# Patient Record
Sex: Male | Born: 1935 | Race: White | Hispanic: No | Marital: Single | Smoking: Never smoker
Health system: Southern US, Community
[De-identification: ages and names within clinical notes are randomized; demographics above are authoritative.]

## PROBLEM LIST (undated history)

## (undated) DIAGNOSIS — I10 Essential (primary) hypertension: Secondary | ICD-10-CM

## (undated) DIAGNOSIS — C801 Malignant (primary) neoplasm, unspecified: Secondary | ICD-10-CM

## (undated) DIAGNOSIS — F32A Depression, unspecified: Secondary | ICD-10-CM

## (undated) DIAGNOSIS — I351 Nonrheumatic aortic (valve) insufficiency: Secondary | ICD-10-CM

## (undated) DIAGNOSIS — H3530 Unspecified macular degeneration: Secondary | ICD-10-CM

## (undated) DIAGNOSIS — I951 Orthostatic hypotension: Secondary | ICD-10-CM

## (undated) DIAGNOSIS — Z8673 Personal history of transient ischemic attack (TIA), and cerebral infarction without residual deficits: Secondary | ICD-10-CM

## (undated) DIAGNOSIS — E559 Vitamin D deficiency, unspecified: Secondary | ICD-10-CM

## (undated) DIAGNOSIS — R490 Dysphonia: Secondary | ICD-10-CM

## (undated) DIAGNOSIS — I6529 Occlusion and stenosis of unspecified carotid artery: Secondary | ICD-10-CM

## (undated) DIAGNOSIS — R42 Dizziness and giddiness: Secondary | ICD-10-CM

## (undated) DIAGNOSIS — R809 Proteinuria, unspecified: Secondary | ICD-10-CM

## (undated) DIAGNOSIS — R7989 Other specified abnormal findings of blood chemistry: Secondary | ICD-10-CM

## (undated) DIAGNOSIS — G459 Transient cerebral ischemic attack, unspecified: Secondary | ICD-10-CM

## (undated) DIAGNOSIS — R351 Nocturia: Secondary | ICD-10-CM

## (undated) DIAGNOSIS — K5900 Constipation, unspecified: Secondary | ICD-10-CM

## (undated) DIAGNOSIS — N390 Urinary tract infection, site not specified: Secondary | ICD-10-CM

## (undated) DIAGNOSIS — F329 Major depressive disorder, single episode, unspecified: Secondary | ICD-10-CM

## (undated) DIAGNOSIS — R413 Other amnesia: Secondary | ICD-10-CM

## (undated) DIAGNOSIS — R0989 Other specified symptoms and signs involving the circulatory and respiratory systems: Secondary | ICD-10-CM

## (undated) DIAGNOSIS — R350 Frequency of micturition: Secondary | ICD-10-CM

## (undated) DIAGNOSIS — F0390 Unspecified dementia without behavioral disturbance: Secondary | ICD-10-CM

## (undated) DIAGNOSIS — F419 Anxiety disorder, unspecified: Secondary | ICD-10-CM

## (undated) DIAGNOSIS — H259 Unspecified age-related cataract: Secondary | ICD-10-CM

## (undated) DIAGNOSIS — I059 Rheumatic mitral valve disease, unspecified: Secondary | ICD-10-CM

## (undated) DIAGNOSIS — I639 Cerebral infarction, unspecified: Secondary | ICD-10-CM

## (undated) DIAGNOSIS — C61 Malignant neoplasm of prostate: Secondary | ICD-10-CM

## (undated) DIAGNOSIS — T4145XA Adverse effect of unspecified anesthetic, initial encounter: Secondary | ICD-10-CM

## (undated) DIAGNOSIS — H409 Unspecified glaucoma: Secondary | ICD-10-CM

## (undated) DIAGNOSIS — T8859XA Other complications of anesthesia, initial encounter: Secondary | ICD-10-CM

## (undated) DIAGNOSIS — N179 Acute kidney failure, unspecified: Secondary | ICD-10-CM

## (undated) DIAGNOSIS — R011 Cardiac murmur, unspecified: Secondary | ICD-10-CM

## (undated) DIAGNOSIS — K219 Gastro-esophageal reflux disease without esophagitis: Secondary | ICD-10-CM

## (undated) HISTORY — DX: Urinary tract infection, site not specified: N39.0

## (undated) HISTORY — DX: Occlusion and stenosis of unspecified carotid artery: I65.29

## (undated) HISTORY — DX: Proteinuria, unspecified: R80.9

## (undated) HISTORY — DX: Unspecified macular degeneration: H35.30

## (undated) HISTORY — DX: Rheumatic mitral valve disease, unspecified: I05.9

## (undated) HISTORY — DX: Personal history of transient ischemic attack (TIA), and cerebral infarction without residual deficits: Z86.73

## (undated) HISTORY — DX: Major depressive disorder, single episode, unspecified: F32.9

## (undated) HISTORY — DX: Cerebral infarction, unspecified: I63.9

## (undated) HISTORY — DX: Malignant neoplasm of prostate: C61

## (undated) HISTORY — DX: Dizziness and giddiness: R42

## (undated) HISTORY — DX: Anxiety disorder, unspecified: F41.9

## (undated) HISTORY — DX: Unspecified age-related cataract: H25.9

## (undated) HISTORY — PX: RETINAL DETACHMENT SURGERY: SHX105

## (undated) HISTORY — DX: Transient cerebral ischemic attack, unspecified: G45.9

## (undated) HISTORY — DX: Nonrheumatic aortic (valve) insufficiency: I35.1

## (undated) HISTORY — DX: Essential (primary) hypertension: I10

## (undated) HISTORY — DX: Unspecified glaucoma: H40.9

## (undated) HISTORY — PX: CATARACT EXTRACTION: SUR2

## (undated) HISTORY — DX: Depression, unspecified: F32.A

## (undated) HISTORY — DX: Orthostatic hypotension: I95.1

## (undated) HISTORY — DX: Vitamin D deficiency, unspecified: E55.9

## (undated) HISTORY — DX: Malignant (primary) neoplasm, unspecified: C80.1

## (undated) HISTORY — DX: Acute kidney failure, unspecified: N17.9

## (undated) HISTORY — DX: Unspecified dementia, unspecified severity, without behavioral disturbance, psychotic disturbance, mood disturbance, and anxiety: F03.90

## (undated) HISTORY — DX: Other specified abnormal findings of blood chemistry: R79.89

## (undated) HISTORY — DX: Other specified symptoms and signs involving the circulatory and respiratory systems: R09.89

---

## 1998-10-25 ENCOUNTER — Other Ambulatory Visit: Admission: RE | Admit: 1998-10-25 | Discharge: 1998-10-25 | Payer: Self-pay | Admitting: Urology

## 2004-06-19 ENCOUNTER — Encounter (INDEPENDENT_AMBULATORY_CARE_PROVIDER_SITE_OTHER): Payer: Self-pay | Admitting: Cardiology

## 2004-06-19 ENCOUNTER — Inpatient Hospital Stay (HOSPITAL_COMMUNITY): Admission: EM | Admit: 2004-06-19 | Discharge: 2004-06-19 | Payer: Self-pay | Admitting: Emergency Medicine

## 2008-08-06 ENCOUNTER — Encounter: Admission: RE | Admit: 2008-08-06 | Discharge: 2008-08-06 | Payer: Self-pay | Admitting: *Deleted

## 2008-10-07 ENCOUNTER — Ambulatory Visit (HOSPITAL_COMMUNITY): Admission: RE | Admit: 2008-10-07 | Discharge: 2008-10-07 | Payer: Self-pay | Admitting: Urology

## 2009-06-07 ENCOUNTER — Encounter: Admission: RE | Admit: 2009-06-07 | Discharge: 2009-06-07 | Payer: Self-pay | Admitting: Internal Medicine

## 2009-07-06 ENCOUNTER — Ambulatory Visit: Payer: Self-pay | Admitting: Vascular Surgery

## 2010-02-23 ENCOUNTER — Ambulatory Visit: Payer: Self-pay | Admitting: Vascular Surgery

## 2010-08-08 NOTE — Procedures (Signed)
CAROTID DUPLEX EXAM   INDICATION:  TIA, aphasia.   HISTORY:  Diabetes:  Yes.  Cardiac:  No.  Hypertension:  Yes.  Smoking:  No.  Previous Surgery:  No.  CV History:  History of TIA and difficulty speaking.  Amaurosis Fugax No, Paresthesias No, Hemiparesis No.                                       RIGHT             LEFT  Brachial systolic pressure:         160               160  Brachial Doppler waveforms:         Normal            Normal  Vertebral direction of flow:        Antegrade         Antegrade  DUPLEX VELOCITIES (cm/sec)  CCA peak systolic                   63                83  ECA peak systolic                   66                87  ICA peak systolic                   231               43  ICA end diastolic                   42                10  PLAQUE MORPHOLOGY:                  Mixed             Mixed  PLAQUE AMOUNT:                      Moderate          Mild  PLAQUE LOCATION:                    ICA/ECA           ICA/ECA/CCA   IMPRESSION:  1. Low-end 60% to 79% stenosis of the right internal carotid artery.  2. 1% to 39% stenosis of the left internal carotid artery.         ___________________________________________  Janetta Hora Fields, MD   CH/MEDQ  D:  07/07/2009  T:  07/07/2009  Job:  [id number]

## 2010-08-08 NOTE — Procedures (Signed)
CAROTID DUPLEX EXAM   INDICATION:  Carotid artery disease.   HISTORY:  Diabetes:  yes  Cardiac:  no  Hypertension:  yes  Smoking:  no  Previous Surgery:  no  CV History:  History of TIA, slurred speech  Amaurosis Fugax No, Paresthesias No, Hemiparesis No                                       RIGHT             LEFT  Brachial systolic pressure:         158               155  Brachial Doppler waveforms:         normal            normal  Vertebral direction of flow:        Antegrade         Antegrade  DUPLEX VELOCITIES (cm/sec)  CCA peak systolic                   63                72  ECA peak systolic                   72                91  ICA peak systolic                   285               55  ICA end diastolic                   52                15  PLAQUE MORPHOLOGY:                  calcific          heterogenous  PLAQUE AMOUNT:                      moderate          minimal  PLAQUE LOCATION:                    ICA, bifurcation  CCA, bifurcation   IMPRESSION:  1. Right internal carotid artery velocities suggest a 40% to 59%      stenosis.  2. Left internal carotid artery velocities suggest a 1% to 39%      stenosis.         ___________________________________________  Janetta Hora Darrick Penna, MD   EM/MEDQ  D:  02/23/2010  T:  02/23/2010  Job:  [id number]

## 2010-08-08 NOTE — Assessment & Plan Note (Signed)
OFFICE VISIT   WALLACE, GAPPA  DOB:  Dec 22, 1923                                       02/23/2010  [id number]#:[id number]   HISTORY OF PRESENT ILLNESS:  The patient is an 77 year old male who was  last seen in April of 2011 for evaluation of slurred speech in the face  of carotid stenosis.  The patient states he has not had any episodes  since that time.  He was evaluated by [facility], Dr. Marjory Lies,  who agreed that he did not think that the episodes were related to  carotid disease but were potentially related to intracranial disease or  degenerative neurologic changes over time.   In discussions with the patient and his family today he has had no  localizing neurologic events since he was last seen.  Overall he is  unchanged since his last visit with me.  He does have some occasional  dizziness and a slightly unsteady gait but again this is not new.   Chronic medical problems continue to remain diabetes, hypertension and  history of prostate cancer.   On review of systems he denies any shortness of breath or chest pain.   PHYSICAL EXAM:  Vital signs:  Blood pressure is 201/88 in the right arm,  200/85 in the left arm, heart rate 66 and regular, oxygen saturation  97%.  Neck:  Has 2+ carotid pulses without bruit.  Chest:  Clear to  auscultation.  Cardiac:  Regular rate and rhythm without murmur.  Abdomen:  Soft, nontender, nondistended.  No masses.  Extremities:  He  has 2+ brachial, radial and femoral pulses bilaterally.   He had a carotid duplex exam today which showed a 40%-60% right internal  carotid artery stenosis and less than 40% left internal carotid artery  stenosis.   In summary, the patient has moderate carotid stenosis.  I believe this  is asymptomatic.  He is currently on antiplatelet therapy in the form of  aspirin in an Aggrenox combination.  I believe that medical management  would still be the best course of action for him  currently.  He will  have a followup carotid duplex exam in six months' time.  If he has any  neurologic events prior to this he will return sooner.  I did discuss  with the patient and his family today that his blood pressure was  elevated today.  He states that apparently they are adjusting some of  his medications but I told him to continue to document his blood  pressures so that they will have good parameters to measure by at his  next primary evaluation.     Janetta Hora. Fields, MD  Electronically Signed   CEF/MEDQ  D:  02/23/2010  T:  02/24/2010  Job:  [id number]   cc:   Maxwell Caul, M.D.  Joycelyn Schmid, MD

## 2010-08-08 NOTE — Assessment & Plan Note (Signed)
OFFICE VISIT   Ronald Hunt, Ronald Hunt  DOB:  [birth year]                                       07/06/2009  [id number]#:[id number]   The patient is an 77 year old male referred by Dr. Leanord Hawking for evaluation  of TIAs and possible carotid stenosis.  The patient has had several  episodes of slurred speech lasting as long as 30 minutes.  The last  episode of this was 3 weeks ago.  He also had an episode of this 2-3  years ago and was evaluated at [facility] and thought to have TIA at  that time.  His daughter-in-law is present for the office visit today  and she states that these symptoms have been going off and on for  essentially 3 years.  He has never had a focal localizing symptom such  as upper extremity or lower extremity weakness.  These symptoms have  only been speech in character.  The patient also states that he  frequently feels fuzzy like he is going to have a headache but does  not.   CHRONIC MEDICAL PROBLEMS:  Include diabetes, hypertension and prostate  cancer.  The patient has elected not to undergo radiation treatment of  his prostate cancer.  He denies elevated cholesterol.   PAST MEDICAL HISTORY:  Also remarkable for macular degeneration.   PAST SURGICAL HISTORY:  He had a detached retina and cataracts.   FAMILY HISTORY:  Unremarkable.   SOCIAL HISTORY:  He is widowed.  He is retired.  He has two children.  He is a nonsmoker, nonconsumer of alcohol.   REVIEW OF SYSTEMS:  Full 12 point review of systems was performed with  the patient today.  Please see intake referral form for details  regarding that.   PHYSICAL EXAM:  Vital signs:  Blood pressure is 185/81 in the right arm,  170/78 in the left arm, heart rate 61 and regular.  HEENT:  Unremarkable.  Neck:  Has 2+ carotid pulses without bruit.  Chest:  Clear to auscultation.  Cardiac:  Exam is regular rate and rhythm  without murmur.  Abdomen:  Soft, nontender, nondistended.  No masses.  Extremities:  He has no significant joint deformities.  He has no  significant edema.  He has 2+ femoral pulses bilaterally.  He has 2+  brachial, radial and carotid pulses.  Neurological:  Exam shows  symmetric upper extremity and lower extremity motor strength which is  5/5 and symmetric.  Cranial nerve exam shows cranial nerves II-XII are  intact.  Skin:  Has no open ulcers or rashes.   He had a carotid duplex exam today which shows less than 40% left  internal carotid artery stenosis and the lower end of the 60%-80%  stenosis on the right side.  He had antegrade vertebral flow  bilaterally.   In summary, the patient has been having episodes of slurred speech that  could possibly represent TIA.  However, his carotid duplex exam shows no  significant left-sided carotid stenosis and usually speech events are  left brain related.  He is right-handed.  He does have a moderate  stenosis on the right side but this is barely into the 60% stenosis  range.  He is currently on Aggrenox antiplatelet therapy.  I believe  this is the best option currently.  We will have him evaluated by  [facility]  Neurology to see if they agree that the 60% carotid stenosis is  probably not responsible for his TIA type symptoms.  He will follow up  with me on a 6 month basis to recheck his carotids by ultrasound.  He  will follow up sooner if neurology believes that his events may be  related to carotid disease.  This was all discussed with the patient and  his daughter-in-law today.     Ronald Hora. Fields, MD  Electronically Signed   CEF/MEDQ  D:  07/07/2009  T:  07/07/2009  Job:  [id number]   cc:   Maxwell Caul, M.D.  [facility]

## 2010-08-11 NOTE — H&P (Signed)
NAME:  Ronald Hunt, GAEDE NO.:  [id number]   MEDICAL RECORD NO.:  [id number]          PATIENT TYPE:  EMS   LOCATION:  MAJO                         FACILITY:  [facility]   PHYSICIAN:  Broadus John T. Pickard II, MDDATE OF BIRTH:  12/20/1923   DATE OF ADMISSION:  06/18/2004  DATE OF DISCHARGE:                                HISTORY & PHYSICAL   PRIMARY CARE PHYSICIAN:  Lianne Bushy, M.D.   CHIEF COMPLAINT:  Nonsense speech.   HISTORY OF PRESENT ILLNESS:  The patient is a 77 year old white male with no  past medical history except for hypertension, who was in normal state of  health.  He was taking a nap this afternoon when his son called awoke him  around 5:30 p.m.  The patient answered the phone and was speaking clearly,  but was making no sense.  Words were easily understandable and clear, but no  subjects, no predicates, and no point to the sentences he was making.  The  patient and the son both deny any dysarthria, mainly just no formed  sentences, just random words.  The patient was aware of this and was unable  to correct himself and make clear sentences.  The patient denies any  unilateral weakness, numbness, or confusion.  Symptoms cleared within 2  hours and by 7:30 the patient was at baseline per him as well as his son.  However, blood pressures  were elevated on arrival at 240's over 120's.  The  patient denies any chest pain, shortness of breath, palpitation, vision  changes, or headaches.  He denies any history of diabetes mellitus, use of  insulin, and denies any history of seizures.  Denies any recreational drugs.   REVIEW OF SYSTEMS:  GENERAL:  No weakness, no fatigue, no fevers, no weight  loss. NEUROLOGY:  Positive nonsense speech.  No dysarthria, no unilateral  weakness, no confusion, no headache.  CARDIOVASCULAR:  No chest pain, no  palpitations, no PND, no orthopnea.  PULMONARY:  No shortness of breath, no  pleuritic pain.  GASTROINTESTINAL:  No nausea,  vomiting, diarrhea, melena,  bright red blood per rectum.  GENITOURINARY:  No dysuria, no frequency.  MUSCULOSKELETAL:  No myalgia, no arthralgia.   PAST MEDICAL HISTORY:  1.  Hypertension.  2.  Borderline diet controlled diabetes mellitus.  3.  History of tonsil and adenoidectomies in his 20's.  4.  History of detached retina.  5.  History of cataract surgery.   MEDICATIONS:  The patient states he has been taking three different blood  pressure medicines in the past week including Norvasc and what sounds to be  an ACE inhibitor.  However, the patient denies taking any of his medicines  now.  Does admit to taking some herbs and vitamin supplements.  Cannot give  the names at the present time.   ALLERGIES:  No known drug allergies.   FAMILY HISTORY:  Sister with diabetes mellitus.  Father died from old age.  Mother died in her 55's from an unknown cause.   SOCIAL HISTORY:  The patient lives in [place], is a widower, has a  son  who lives in [place] and a daughter who lives in [place].  Denies any  tobacco.  Does report one to two beers per day occasionally.  Denies any  recreational drugs.   PHYSICAL EXAMINATION:  VITAL SIGNS:  Temperature 97.0, pulse 79 to 80, blood  pressure 192 to 245 over 103 to 119.  Respiratory rate 16, SPO2 is 98%.  GENERAL:  In no acute distress.  Alert and oriented x3.  MMSE is 30/30.  HEENT:  Atraumatic and normocephalic.  Pupils equal, round, and reactive to  light, 2 mm.  Extraocular movements are intact.  TM's and canals are clear  on examination.  There is no erythema or exudate in the posterior  oropharynx.  NECK:  Supple.  There is no lymphadenopathy and no thyromegaly.  There are  prominent parotid glands bilaterally.  CARDIOVASCULAR:  Regular rate and rhythm, no murmurs, rubs, or gallops.  No  JVD.  2/4 radial pulses bilaterally.  LUNGS:  Clear to auscultation bilaterally.  No wheezes, crackles, or rales.  ABDOMEN:  Soft, nontender, and  nondistended, positive bowel sounds.  EXTREMITIES:  No cyanosis, clubbing, or edema.  NEUROLOGY:  Cranial nerves II-XII grossly intact.  Muscle strength is 5/5,  equal, and symmetric in the upper and lower extremities.  The patient has a  normal cerebellar examination on alternating movements, nose-to-finger test  and also alternating fingertip test.  The patient does have a mild intension  tremor.   LABORATORY DATA:  White count 6.4, hemoglobin 14.1, hematocrit 40.8,  platelet count 141.  Sodium 139, potassium 3.6, chloride 105, bicarb 29, BUN  18, creatinine 0.9, glucose 138, AST 25, ALT 18, alkaline phosphatase 67,  total bilirubin 0.7.  The patient's myoglobin is 111, CK-MB 1.6, troponin I  less than 0.05.   Chest x-ray shows no active disease.  Head CT shows chronic microvascular  white matter disease.   ASSESSMENT:  A 77 year old white male.   Problem 1.  Nonsense speech.  Differential diagnoses include TIA,  hypoglycemia, drugs, toxins, seizure.  History is suggestive of TIA.  Will  begin aspirin 325 mg p.o. daily now.  Will also titrate medicines to get a  blood pressure of approximately 180/90, but want to avoid drop in blood  pressure too low given the chronic nature of his hypertension.  Will discuss  with team in the a.m. whether the patient would benefit from a two-  dimensional echocardiogram and carotid Dopplers to evaluate for possible  sources of embolic phenomenon, versus just regular microvascular changes  given longstanding hypertension. Will also check an EKG in the a.m.   Problem 2.  Hypertension and urgency.  Will lower blood pressure to a goal  of around 180/90.  Will give hydrochlorothiazide 25 mg p.o. daily right now,  watch blood pressure overnight, may add an ACE inhibitor in the a.m.  Will  treat p.r.n. with IV metoprolol.   Problem 3.  Diabetes mellitus diet controlled.  Will check a hemoglobin A1C and also put the patient on sliding scale insulin with  q.a.c. and q.h.s.  Accu-Cheks.   Problem 4.  Risk stratification.  Will check a fasting lipid panel.      WTP/MEDQ  D:  06/19/2004  T:  06/19/2004  Job:  [id number]

## 2010-08-11 NOTE — Discharge Summary (Signed)
NAME:  Ronald Hunt, Ronald Hunt NO.:  [id number]   MEDICAL RECORD NO.:  [id number]          PATIENT TYPE:  INP   LOCATION:  3731                         FACILITY:  [facility]   PHYSICIAN:  Melina Fiddler, MD DATE OF BIRTH:  07/30/1923   DATE OF ADMISSION:  06/18/2004  DATE OF DISCHARGE:  06/19/2004                                 DISCHARGE SUMMARY   ATTENDING PHYSICIAN:  Melina Fiddler, MD   PRIMARY CARE PHYSICIAN:  Lianne Bushy, M.D.   DISCHARGE DIAGNOSES:  1.  Transient ischemic attack with word salad.  2.  Hypertensive urgency.   PROCEDURE:  1.  Head CT scan June 18, 2004 shows no acute intracranial abnormalities,      shows chronic small vessel white matter disease.  2.  Chest x-ray dated June 18, 2004 shows no active cardiopulmonary      disease.  3.  There is a 2-dimensional echocardiogram of the heart pending at the time      of this discharge summary.  4.  There is also carotid Doppler's pending at the time of this discharge      summary and we would appreciate it if Dr. Purnell Shoemaker would follow up the      results of the carotid Doppler's as well as the 2-dimensional      echocardiogram of the heart.   LABORATORY DATA:  CBC on admission shows white count 6.4, hemoglobin 14.1,  hematocrit 40.8, platelet count 141,000. CMP on admission shows sodium 139,  potassium 3.6, chloride 105, bicarb 29, glucose 138, BUN 18, creatinine 0.9,  total bilirubin 0.7, alkaline phosphatase 67, AST 25, ALT 18.  Point of care  cardiac markers with CK-MB 1.6, troponin 0.05, myoglobin of 111 which were  all at normal.  Hemoglobin A1C on admission was 5.8, within normal range.  A  fasting lipid panel obtained on admission shows cholesterol 172,  triglycerides 99, HDL 37, LDL 115.   HISTORY OF PRESENT ILLNESS:  The patient is a 77 year old white male who  presented with two hours of non-sensical speech.  He was found by his son to  be speaking clear words without clear sentence  structure.  The patient was  completely aware of this but was unable to make coherent sentences.  He  denied any unilateral weakness, decreased sensation, etc.  The patient was  admitted for presumptive transient ischemic attack and evaluation of this.   HOSPITAL COURSE:  PROBLEM #1:  TRANSIENT ISCHEMIC ATTACK:  The patient was  started on aspirin 325 mg p.o. daily and would continue aspirin as an  outpatient.  He was risk stratified with a fasting lipid panel which showed  LDL of 115.  We will allow Dr. Purnell Shoemaker to treat this as he deems appropriate.  He was also risk stratified with hemoglobin A1C which showed no evidence of  diabetes mellitus. At the time of this discharge summary a 2-dimensional  echocardiogram of the heart as well as carotid Doppler's are pending to  evaluate for possible sources of embolic phenomenon causing his transient  ischemic attack beyond the most likely just chronic  small vessel disease  secondary to uncontrolled hypertension.   PROBLEM #2:  HYPERTENSION:  The patient was admitted with blood pressure of  240/120.  He was given Clonidine in the emergency department and blood  pressure dropped to 190's/100.  Patient was started on hydrochlorothiazide  25 mg p.o. daily and blood pressures fell into the 160's/80's.  Will be  discharged home on hydrochlorothiazide 25 mg p.o. daily as well as Lotensin  10 mg p.o. daily.   DISCHARGE MEDICATIONS:  1.  Aspirin 325 mg p.o. daily.  2.  Lotensin 10 mg p.o. daily.  3.  Hydrochlorothiazide 25 mg p.o. daily.   FOLLOW UP:  Patient is instructed to follow up with Dr. Purnell Shoemaker at [facility] on June 26, 2004 at 3:00 P.M.  We would appreciate it if Dr.  Purnell Shoemaker would follow up the results of the 2-dimensional echocardiogram as  well as the carotid Doppler's for possible reversible causes of ischemic CNS  abnormalities.  Would also appreciate if Dr. Purnell Shoemaker would further titrate his  blood pressure medications to obtain  adequate control of his blood pressure  and address the borderline hyperlipidemia with the patient.      WTP/MEDQ  D:  06/19/2004  T:  06/19/2004  Job:  [id number]   cc:   Lianne Bushy, M.D.  [address]  [place]  [place] [address]  Fax: [phone]

## 2010-12-21 ENCOUNTER — Telehealth: Payer: Self-pay

## 2010-12-21 NOTE — Telephone Encounter (Signed)
Pt's daughter-in-law called and voiced concern about pt. havin hx. of TIA's and c/o strange feeling in head for awhile, c/o weakness in feet and legs, and excessive tiredness.  States he has a carotid US and doctor visit sched. In December.  Asking if he can be seen sooner, as they have researched temporal arteritis, and have questions about this and correlation to his carotid disease. Noted in Dec. 2011 office note that pt. Was to have a 6 mo. Carotid US f/u.  Advised daughter-in-law that office will look at options to move pt. appt to earlier date, and contact her.

## 2011-01-23 ENCOUNTER — Encounter: Payer: Self-pay | Admitting: Vascular Surgery

## 2011-01-24 ENCOUNTER — Encounter: Payer: Self-pay | Admitting: Vascular Surgery

## 2011-01-25 ENCOUNTER — Encounter: Payer: Self-pay | Admitting: Vascular Surgery

## 2011-01-25 ENCOUNTER — Ambulatory Visit (INDEPENDENT_AMBULATORY_CARE_PROVIDER_SITE_OTHER): Payer: Medicare Other | Admitting: Vascular Surgery

## 2011-01-25 VITALS — BP 133/74 | HR 53 | Ht 73.0 in | Wt 184.0 lb

## 2011-01-25 DIAGNOSIS — I6529 Occlusion and stenosis of unspecified carotid artery: Secondary | ICD-10-CM

## 2011-01-25 NOTE — Progress Notes (Signed)
[facility] [place] HISTORY AND PHYSICAL   History of Present Illness:  Patient is a 77 y.o. year old male who presents for follow-up evaluation for carotid stenosis.  He is on Aggrenox for antiplatelet therapy.  His atherosclerotic risk factors remain diabetes, hypertension, smoking, and age.  BP and diabetes are currently stable and followed by his primary care physician.  He denies any new neurologic events including amaurosis, numbness, or weakness. However, he still gets occasional full feeling in his head which is relieved by one advil.  He also has a very slow shuffling gait noted by his family which the pt states is due to balance issues.  He does not describe any classical TIA, stroke or amaurosis events.  Past Medical History  Diagnosis Date  . Diabetes mellitus   . Hypertension   . Cancer     prostate  . Depression   . Anxiety   . Dizziness   . Stroke   . Macular degeneration   . History of TIAs   . Carotid artery occlusion   . Glaucoma     Past Surgical History  Procedure Date  . Retinal detachment surgery   . Cataract extraction     Review of Systems:  Neurologic: as above Cardiac:denies shortness of breath or chest pain Pulmonary: denies cough or wheeze  Social History History  Substance Use Topics  . Smoking status: Never Smoker   . Smokeless tobacco: Not on file  . Alcohol Use: No    Allergies  No Known Allergies   Current Outpatient Prescriptions  Medication Sig Dispense Refill  . amLODipine (NORVASC) 10 MG tablet Take 10 mg by mouth daily.        Marland Kitchen dipyridamole-aspirin (AGGRENOX) 25-200 MG per 12 hr capsule Take 1 capsule by mouth 2 (two) times daily.        Marland Kitchen glimepiride (AMARYL) 1 MG tablet       . LUMIGAN 0.01 % SOLN       . olmesartan-hydrochlorothiazide (BENICAR HCT) 20-12.5 MG per tablet Take 1 tablet by mouth daily.        . Phosphatidylserine 100 MG CAPS Take 100 mg by mouth daily.        . Tamsulosin HCl (FLOMAX)  0.4 MG CAPS       . Calcium Carbonate-Vitamin D (CALCIUM-VITAMIN D) 500-200 MG-UNIT per tablet Take 1 tablet by mouth daily.        . Cholecalciferol (VITAMIN D3) 2000 UNITS TABS Take 1 tablet by mouth daily.          Physical Examination  Filed Vitals:   01/25/11 1627  BP: 133/74  Pulse: 53  Height: 6\' 1"  (1.854 m)  Weight: 184 lb (83.462 kg)  SpO2: 96%    Body mass index is 24.28 kg/(m^2).  General:  Alert and oriented, no acute distress HEENT: Normal Neck: No bruit or JVD Pulmonary: Clear to auscultation bilaterally Cardiac: Regular Rate and Rhythm without murmur Neurologic: Upper and lower extremity motor 5/5 and symmetric slow shuffling gait  DATA: He had a carotid duplex exam today which I reviewed and interpreted. This showed a 60-80% right internal carotid artery stenosis with velocities slightly increased from December 2011. He had less than 40% left internal carotid artery stenosis   ASSESSMENT: Asymptomatic moderate right internal carotid artery stenosis with slight progression. Had lengthy discussion today with the patient as well as his family regarding his symptoms of his slow gait as well as the full sedation in his  head. I do not believe that these are related to his carotid stenosis. I do not believe that he is having TIA or stroke symptoms. Had lengthy discussion with them about whether or not his Aggrenox could be contributing to his symptoms. I discussed with them that we could consider stopping his Aggrenox and am placing him on aspirin alone. They will have further discussions as a family regarding this.   PLAN: The patient will return in 6 months time for repeat carotid duplex ultrasound. If his stenosis progresses to greater than 80% we would consider carotid endarterectomy. We will also consider carotid endarterectomy if he has classical symptoms of TIA amaurosis or stroke prior to that.  Fabienne Bruns, MD [facility]  [place] Office: [phone] Pager: [phone]

## 2011-02-02 NOTE — Procedures (Unsigned)
CAROTID DUPLEX EXAM  INDICATION:  Carotid stenosis.  HISTORY: Diabetes:  Yes. Cardiac:  No. Hypertension:  Yes. Smoking:  No. Previous Surgery:  No carotid intervention. CV History:  Asymptomatic, complains of a swimmy head. Amaurosis Fugax No, Paresthesias No, Hemiparesis No.                                      RIGHT             LEFT Brachial systolic pressure:         156               164 Brachial Doppler waveforms:         WNL               WNL Vertebral direction of flow:        Antegrade         Antegrade DUPLEX VELOCITIES (cm/sec) CCA peak systolic                   71                99 ECA peak systolic                   92                153 ICA peak systolic                   403/351           100 ICA end diastolic                   69/81             11 PLAQUE MORPHOLOGY:                  Calcified         Heterogenous PLAQUE AMOUNT:                      Moderate-to-severe                  Mild PLAQUE LOCATION:                    CCA/ICA           CCA/ICA/ECA  IMPRESSION: 1. Right internal carotid artery stenosis present in the 60% to 79%     range, which may be underestimated due to acoustic shadow from     dense calcified plaque, making Doppler interrogation difficult. 2. Left internal carotid artery stenosis in the 1% to 39% range. 3. Left external carotid artery stenosis present. 4. Increase in disease on right and stable on the left since previous     study on 02/23/2010.  ___________________________________________ Janetta Hora. Fields, MD  SH/MEDQ  D:  01/25/2011  T:  01/25/2011  Job:  [id number]

## 2011-03-01 ENCOUNTER — Ambulatory Visit: Payer: Self-pay | Admitting: Vascular Surgery

## 2011-03-01 ENCOUNTER — Other Ambulatory Visit: Payer: Self-pay

## 2011-07-24 ENCOUNTER — Other Ambulatory Visit: Payer: Self-pay | Admitting: *Deleted

## 2011-07-24 DIAGNOSIS — I6529 Occlusion and stenosis of unspecified carotid artery: Secondary | ICD-10-CM

## 2011-07-25 ENCOUNTER — Other Ambulatory Visit (INDEPENDENT_AMBULATORY_CARE_PROVIDER_SITE_OTHER): Payer: Medicare Other | Admitting: *Deleted

## 2011-07-25 ENCOUNTER — Other Ambulatory Visit: Payer: Medicare Other

## 2011-07-25 DIAGNOSIS — I6529 Occlusion and stenosis of unspecified carotid artery: Secondary | ICD-10-CM

## 2011-07-27 NOTE — Procedures (Unsigned)
CAROTID DUPLEX EXAM  INDICATION:  Follow up carotid disease.  HISTORY: Diabetes:  Yes. Cardiac:  No. Hypertension:  Yes. Smoking:  No. Previous Surgery: CV History: Amaurosis Fugax No, Paresthesias No, Hemiparesis No.                                      RIGHT             LEFT Brachial systolic pressure:         148               164 Brachial Doppler waveforms:         WNL               WNL Vertebral direction of flow:        Antegrade         Antegrade DUPLEX VELOCITIES (cm/sec) CCA peak systolic                   76                96 ECA peak systolic                   73                112 ICA peak systolic                   400               101 ICA end diastolic                   108               13 PLAQUE MORPHOLOGY:                  Calcific          Heterogenous PLAQUE AMOUNT:                      Severe            Mild PLAQUE LOCATION:                    CCA/ECA/ICA       CCA/ECA/ICA  IMPRESSION: 1. Velocities suggest high-end 60% to 79% right internal carotid     artery stenosis; however, Doppler cannot fully penetrate the     acoustic shadow.  Velocities are taken just distal to the plaque     calcification and may not be representative of true disease. 2. 1% to 39% left internal carotid artery plaquing. 3. Bilateral vertebral arteries are within normal limits. 4. Incidental finding:  Abnormal right thyroid with multiple     vascularized cystic structures. 5. Results were communicated to Dr. Edilia Bo in clinic, who instructed     the patient to make a follow-up appointment with Dr. Darrick Penna.  ___________________________________________ Janetta Hora Fields, MD  LT/MEDQ  D:  07/25/2011  T:  07/25/2011  Job:  [id number]

## 2011-08-08 ENCOUNTER — Encounter: Payer: Self-pay | Admitting: Vascular Surgery

## 2011-08-09 ENCOUNTER — Ambulatory Visit (INDEPENDENT_AMBULATORY_CARE_PROVIDER_SITE_OTHER): Payer: Medicare Other | Admitting: Vascular Surgery

## 2011-08-09 ENCOUNTER — Encounter: Payer: Self-pay | Admitting: Vascular Surgery

## 2011-08-09 VITALS — BP 135/75 | HR 65 | Resp 16 | Ht 73.0 in | Wt 183.2 lb

## 2011-08-09 DIAGNOSIS — I6529 Occlusion and stenosis of unspecified carotid artery: Secondary | ICD-10-CM

## 2011-08-09 NOTE — Progress Notes (Signed)
[facility] [place] HISTORY AND PHYSICAL   History of Present Illness:  Patient is a 78 y.o. year old male who presents for follow-up evaluation for carotid stenosis.  He is on Aspirin for antiplatelet therapy.  His atherosclerotic risk factors remain diabetes, elevated cholesterol, hypertension.  These are all currently stable and followed by his primary care physician.  He denies any new neurologic events including amaurosis, numbness, or weakness. He has been unsteady on his feet and has had several falls recently. He does seem to also have some component of dementia.  Past Medical History  Diagnosis Date  . Diabetes mellitus   . Hypertension   . Cancer     prostate  . Depression   . Anxiety   . Dizziness   . Stroke   . Macular degeneration   . History of TIAs   . Carotid artery occlusion   . Glaucoma     Past Surgical History  Procedure Date  . Retinal detachment surgery   . Cataract extraction     Review of Systems:  Neurologic: as above Cardiac:denies shortness of breath or chest pain Pulmonary: denies cough or wheeze  Social History History  Substance Use Topics  . Smoking status: Never Smoker   . Smokeless tobacco: Not on file  . Alcohol Use: No    Allergies  No Known Allergies   Current Outpatient Prescriptions  Medication Sig Dispense Refill  . amLODipine (NORVASC) 10 MG tablet Take 10 mg by mouth daily.        Marland Kitchen aspirin 81 MG tablet Take 81 mg by mouth daily.      Marland Kitchen BETIMOL 0.5 % ophthalmic solution Place 0.5 mLs into both eyes daily.       Marland Kitchen glimepiride (AMARYL) 1 MG tablet       . LUMIGAN 0.01 % SOLN 0.01 drops daily.       Marland Kitchen olmesartan-hydrochlorothiazide (BENICAR HCT) 20-12.5 MG per tablet Take 1 tablet by mouth daily.        . Phosphatidylserine 100 MG CAPS Take 100 mg by mouth daily.        . Tamsulosin HCl (FLOMAX) 0.4 MG CAPS       . Calcium Carbonate-Vitamin D (CALCIUM-VITAMIN D) 500-200 MG-UNIT per tablet Take 1 tablet  by mouth daily.        . Cholecalciferol (VITAMIN D3) 2000 UNITS TABS Take 1 tablet by mouth daily.        Marland Kitchen dipyridamole-aspirin (AGGRENOX) 25-200 MG per 12 hr capsule Take 1 capsule by mouth 2 (two) times daily.         Aggrenox. He is on aspirin alone  Physical Examination  Filed Vitals:   08/09/11 1504 08/09/11 1505  BP: 153/77 135/75  Pulse: 66 65  Resp: 16 16  Height: 6\' 1"  (1.854 m) 6\' 1"  (1.854 m)  Weight: 183 lb 3.2 oz (83.099 kg) 183 lb 3.2 oz (83.099 kg)  SpO2: 99% 99%    Body mass index is 24.17 kg/(m^2).  General:  Alert and oriented, no acute distress HEENT: Normal Neck: No bruit or JVD Pulmonary: Clear to auscultation bilaterally Cardiac: Regular Rate and Rhythm without murmur Neurologic: Upper and lower extremity motor 5/5 and symmetric  DATA: He had bilateral carotid duplex exam recently I reviewed these findings today he has severe calcification and progression towards greater than 80% stenosis on the right side   ASSESSMENT: High-grade right internal carotid artery stenosis. I believe he would benefit from right carotid endarterectomy.  I explained to him today the risks benefits possible complications and procedure details of carotid endarterectomy. Also discussed with him the possibility of stroke risk with greater than 80% stenosis as well as the possibility of stroke risk of operation. He wishes to think about whether or not he wants to undergo a right carotid endarterectomy. He will call to schedule this if he wishes to have a right carotid endarterectomy.  If he wishes to have right carotid endarterectomy he will need cardiac risk stratification preoperatively. If he chooses observation at this point he will need a carotid duplex scan in 6 months.   PLAN:  See above  Fabienne Bruns, MD [facility] [place] Office: [phone] Pager: [phone]

## 2011-08-10 ENCOUNTER — Encounter (HOSPITAL_COMMUNITY): Payer: Self-pay | Admitting: Pharmacy Technician

## 2011-08-14 ENCOUNTER — Other Ambulatory Visit: Payer: Self-pay

## 2011-08-15 ENCOUNTER — Inpatient Hospital Stay (HOSPITAL_COMMUNITY): Admission: RE | Admit: 2011-08-15 | Discharge: 2011-08-15 | Payer: Medicare Other | Source: Ambulatory Visit

## 2011-08-15 ENCOUNTER — Encounter (HOSPITAL_COMMUNITY): Payer: Self-pay

## 2011-08-15 HISTORY — DX: Gastro-esophageal reflux disease without esophagitis: K21.9

## 2011-08-15 NOTE — Progress Notes (Signed)
Pt. Has appoint with Dr. Rennis Golden on 08-17-2011 for cardiac clearance.

## 2011-08-15 NOTE — Pre-Procedure Instructions (Signed)
20 Ronald Hunt  08/15/2011   Your procedure is scheduled on: 08-22-2011 @ 8:30AM  Report to [facility] Stay Center at 6:30 AM.  Call this number if you have problems the morning of surgery: [phone]   Remember:   Do not eat food:After Midnight.  May have clear liquids: up to 4 Hours before arrival.until 2:30 AM  Clear liquids include soda, tea, black coffee, apple or grape juice, broth.  Take these medicines the morning of surgery with A SIP OF WATERamlodipine,Betimol eye dropd,lumigan eye drops,tamsulosin   Do not wear jewelry, make-up or nail polish.  Do not wear lotions, powders, or perfumes. You may wear deodorant.  Do not shave 48 hours prior to surgery. Men may shave face and neck.  Do not bring valuables to the hospital.  Contacts, dentures or bridgework may not be worn into surgery.  Leave suitcase in the car. After surgery it may be brought to your room.  For patients admitted to the hospital, checkout time is 11:00 AM the day of discharge.     Special Instructions: CHG Shower Use Special Wash: 1/2 bottle night before surgery and 1/2 bottle morning of surgery.   Please read over the following fact sheets that you were given: Pain Booklet, Coughing and Deep Breathing, Blood Transfusion Information, MRSA Information and Surgical Site Infection Prevention

## 2011-08-17 ENCOUNTER — Inpatient Hospital Stay (HOSPITAL_COMMUNITY): Admission: RE | Admit: 2011-08-17 | Payer: Medicare Other | Source: Ambulatory Visit

## 2011-08-22 ENCOUNTER — Encounter (HOSPITAL_COMMUNITY): Admission: RE | Payer: Self-pay | Source: Ambulatory Visit

## 2011-08-22 ENCOUNTER — Ambulatory Visit (HOSPITAL_COMMUNITY): Admission: RE | Admit: 2011-08-22 | Payer: Medicare Other | Source: Ambulatory Visit | Admitting: Vascular Surgery

## 2011-08-22 SURGERY — ENDARTERECTOMY, CAROTID
Anesthesia: General | Site: Neck | Laterality: Right

## 2011-08-28 ENCOUNTER — Telehealth: Payer: Self-pay | Admitting: Vascular Surgery

## 2011-08-28 ENCOUNTER — Encounter: Payer: Self-pay | Admitting: *Deleted

## 2011-08-28 ENCOUNTER — Other Ambulatory Visit: Payer: Self-pay | Admitting: *Deleted

## 2011-08-28 NOTE — Telephone Encounter (Addendum)
Message copied by Rosalyn Charters on Tue Aug 28, 2011  3:02 PM ------      Message from: Melene Plan      Created: Tue Aug 28, 2011  2:15 PM       Please schedule him for office visit.It can be in [facility]'s clinic for H & P prior to surgery 09/12/11.thanks      ----- Message -----         From: Sherren Kerns, MD         Sent: 08/28/2011  12:15 PM           To: Melene Plan, RN            Yes he needs an H and P update thanks            Leonette Most      ----- Message -----         From: Melene Plan, RN         Sent: 08/28/2011  10:01 AM           To: Sherren Kerns, MD, Conley Simmonds Pullins, RN            He has been cleared by [facility] for surgery. I have rescheduled him for 09/12/11. His last office visit was 08/09/11 and was originally scheduled 08/22/11. Does he need an office visit with [facility] for h&p update?  NOTIFED PT. OF APPT. ON 08-30-11 1:40 PM FOR H&P WITH [facility]

## 2011-08-29 ENCOUNTER — Encounter: Payer: Self-pay | Admitting: Neurosurgery

## 2011-08-30 ENCOUNTER — Ambulatory Visit (INDEPENDENT_AMBULATORY_CARE_PROVIDER_SITE_OTHER): Payer: Medicare Other | Admitting: Neurosurgery

## 2011-08-30 ENCOUNTER — Encounter: Payer: Self-pay | Admitting: Neurosurgery

## 2011-08-30 VITALS — BP 143/73 | HR 61 | Resp 16 | Ht 73.0 in | Wt 188.5 lb

## 2011-08-30 DIAGNOSIS — I6529 Occlusion and stenosis of unspecified carotid artery: Secondary | ICD-10-CM

## 2011-08-30 NOTE — Progress Notes (Signed)
[facility] [place] HISTORY AND PHYSICAL   CC: 78-year-old male patient of Dr. Fields followed for known carotid stenosis. Referring Physician: Fields  History of Present Illness: 78-year-old well patient of Dr. Fields who is scheduled for a right CEA on 09/12/2011. The patient denies any signs or symptoms of CVA, TIA, amaurosis fugax or any neural deficit at this point. The patient and his wife deny any new medical diagnoses recent surgeries or change in medication.  Past Medical History  Diagnosis Date  . Diabetes mellitus   . Hypertension   . Cancer     prostate  . Depression   . Anxiety   . Dizziness   . Macular degeneration   . History of TIAs   . Carotid artery occlusion   . Glaucoma   . Stroke     TIA  . GERD (gastroesophageal reflux disease)   . Mitral valvular disorder     ROS: [x] Positive   [ ] Denies    General: [ ] Weight loss, [ ] Fever, [ ] chills Neurologic: [ ] Dizziness, [ ] Blackouts, [ ] Seizure [ ] Stroke, [ ] "Mini stroke", [ ] Slurred speech, [ ] Temporary blindness; [ ] weakness in arms or legs, [ ] Hoarseness Cardiac: [ ] Chest pain/pressure, [ ] Shortness of breath at rest [ ] Shortness of breath with exertion, [ ] Atrial fibrillation or irregular heartbeat Vascular: [ ] Pain in legs with walking, [ ] Pain in legs at rest, [ ] Pain in legs at night,  [ ] Non-healing ulcer, [ ] Blood clot in vein/DVT,   Pulmonary: [ ] Home oxygen, [ ] Productive cough, [ ] Coughing up blood, [ ] Asthma,  [ ] Wheezing Musculoskeletal:  [ ] Arthritis, [ ] Low back pain, [ ] Joint pain Hematologic: [ ] Easy Bruising, [ ] Anemia; [ ] Hepatitis Gastrointestinal: [ ] Blood in stool, [ ] Gastroesophageal Reflux/heartburn, [ ] Trouble swallowing Urinary: [ ] chronic Kidney disease, [ ] on HD - [ ] MWF or [ ] TTHS, [ ] Burning with urination, [ ] Difficulty urinating Skin: [ ] Rashes, [ ] Wounds Psychological: [ ] Anxiety, [ ] Depression   Social  History History  Substance Use Topics  . Smoking status: Never Smoker   . Smokeless tobacco: Not on file  . Alcohol Use: Yes     rarely    Family History Family History  Problem Relation Age of Onset  . Cancer Brother   . Crohn's disease Son     No Known Allergies  Current Outpatient Prescriptions  Medication Sig Dispense Refill  . amLODipine (NORVASC) 10 MG tablet Take 10 mg by mouth daily.        . aspirin 81 MG tablet Take 81 mg by mouth daily.      . BETIMOL 0.5 % ophthalmic solution Place 1 drop into both eyes daily.       . glimepiride (AMARYL) 1 MG tablet Take 1 mg by mouth daily before breakfast.       . LUMIGAN 0.01 % SOLN Place 1 drop into both eyes at bedtime.       . olmesartan-hydrochlorothiazide (BENICAR HCT) 20-12.5 MG per tablet Take 1 tablet by mouth daily.        . Tamsulosin HCl (FLOMAX) 0.4 MG CAPS Take 0.4 mg by mouth daily.         Physical Examination  Filed Vitals:   08/30/11 1353    BP: 143/73  Pulse: 61  Resp:     Body mass index is 24.87 kg/(m^2).  General:  WDWN in NAD Gait: Normal HENT: WNL Eyes: Pupils equal Pulmonary: normal non-labored breathing , without Rales, rhonchi,  wheezing Cardiac: RRR, without  Murmurs, rubs or gallops; No carotid bruits Abdomen: soft, NT, no masses Skin: no rashes, ulcers noted Vascular Exam/Pulses: 2+ radial pulses bilaterally, carotid pulses to auscultation, very mild right sided bruit  Extremities without ischemic changes, no Gangrene , no cellulitis; no open wounds;  Musculoskeletal: no muscle wasting or atrophy  Neurologic: A&O X 3; Appropriate Affect ; SENSATION: normal; MOTOR FUNCTION:  moving all extremities equally. Speech is fluent/normal   ASSESSMENT/PLAN: Asymptomatic 78-year-old well patient for right carotid CEA 09/12/2011 with Dr. Fields. Postop followup will be pending that procedure. The patient and his wife's questions were encouraged and answered.  Kenny Stern ANP  Clinic M.D.:  Fields   

## 2011-08-31 ENCOUNTER — Encounter (HOSPITAL_COMMUNITY): Payer: Self-pay | Admitting: *Deleted

## 2011-08-31 ENCOUNTER — Encounter (HOSPITAL_COMMUNITY): Payer: Self-pay | Admitting: Pharmacy Technician

## 2011-08-31 NOTE — Progress Notes (Signed)
Confirmed lab appointment with pt for 09/04/11 @ 9:20 AM

## 2011-08-31 NOTE — Progress Notes (Signed)
Saw Dr.Hilty is cardiologist-to request last office visit  Echo and Stress test done 2wks ago as well as EKG-to request from [facility] MD is Dr.Robson @ [facility]

## 2011-08-31 NOTE — Pre-Procedure Instructions (Addendum)
20 Ronald Hunt  08/31/2011   Your procedure is scheduled on:  Wed, June 19   Report to [facility] Short Stay Center at 8:30 AM.  Call this number if you have problems the morning of surgery: [phone]   Remember:   Do not eat food:After Midnight.  May have clear liquids: up to 4 Hours before arrival.(until 4:30 AM)  Clear liquids include soda, tea, black coffee, apple or grape juice, broth,water  Take these medicines the morning of surgery with A SIP OF WATER: Amlodipine(Norvasc) and Flomax(Tamsulosin), eye drops   Do not wear jewelry  Do not wear lotions, powders, or cologne  Men may shave face and neck.  Do not bring valuables to the hospital.  Contacts, dentures or bridgework may not be worn into surgery.  Leave suitcase in the car. After surgery it may be brought to your room.  For patients admitted to the hospital, checkout time is 11:00 AM the day of discharge.   Patients discharged the day of surgery will not be allowed to drive home.  Special Instructions: CHG Shower Use Special Wash: 1/2 bottle night before surgery and 1/2 bottle morning of surgery.   Please read over the following fact sheets that you were given: Pain Booklet, Coughing and Deep Breathing, Blood Transfusion Information, MRSA Information and Surgical Site Infection Prevention

## 2011-09-04 ENCOUNTER — Encounter (HOSPITAL_COMMUNITY)
Admission: RE | Admit: 2011-09-04 | Discharge: 2011-09-04 | Disposition: A | Discharge status: Home / Self Care | Payer: Medicare Other | Source: Ambulatory Visit | Attending: Vascular Surgery | Admitting: Vascular Surgery

## 2011-09-04 LAB — URINALYSIS, ROUTINE W REFLEX MICROSCOPIC
Glucose, UA: NEGATIVE mg/dL
Leukocytes, UA: NEGATIVE
Protein, ur: NEGATIVE mg/dL
Specific Gravity, Urine: 1.014 (ref 1.005–1.030)
Urobilinogen, UA: 0.2 mg/dL (ref 0.0–1.0)

## 2011-09-04 LAB — COMPREHENSIVE METABOLIC PANEL
Albumin: 3.7 g/dL (ref 3.5–5.2)
BUN: 31 mg/dL — ABNORMAL HIGH (ref 6–23)
Calcium: 9.4 mg/dL (ref 8.4–10.5)
Chloride: 101 mEq/L (ref 96–112)
Creatinine, Ser: 1.7 mg/dL — ABNORMAL HIGH (ref 0.50–1.35)
Total Bilirubin: 0.3 mg/dL (ref 0.3–1.2)

## 2011-09-04 LAB — TYPE AND SCREEN: ABO/RH(D): O POS

## 2011-09-04 LAB — APTT: aPTT: 31 seconds (ref 24–37)

## 2011-09-04 LAB — SURGICAL PCR SCREEN
MRSA, PCR: NEGATIVE
Staphylococcus aureus: NEGATIVE

## 2011-09-04 LAB — CBC
HCT: 38.3 % — ABNORMAL LOW (ref 39.0–52.0)
MCH: 30 pg (ref 26.0–34.0)
MCHC: 33.4 g/dL (ref 30.0–36.0)
MCV: 89.7 fL (ref 78.0–100.0)
Platelets: 124 10*3/uL — ABNORMAL LOW (ref 150–400)
RDW: 13.7 % (ref 11.5–15.5)
WBC: 6.5 10*3/uL (ref 4.0–10.5)

## 2011-09-04 NOTE — Progress Notes (Signed)
Will send chart to anesthesia to review labs

## 2011-09-05 ENCOUNTER — Encounter (HOSPITAL_COMMUNITY): Payer: Self-pay | Admitting: Vascular Surgery

## 2011-09-05 NOTE — Consult Note (Signed)
Anesthesia Chart Review:  Patient is a 78 year old male scheduled for right CEA on 09/12/11.  History includes non-smoker, DM2, depression, CVA, HTN, hoarseness, GERD, anxiety, long-term memory loss, glaucoma, macular degeneration, mild-moderate AR.  PCP is Dr. Leanord Hawking.    He was seen by Cardiologist Dr. Rennis Golden [facility]) for preoperative evaluation on 08/17/11.  EKG then showed SR with first degree AVB, right BBB.   Echo on 08/21/11 showed mild proximal septal thickening, normal LV systolic function, EF > 55%, impaired LV relaxation, LA moderately dilated, mild mitral annular calcification, normal RV systolic pressure, mild aortic sclerosis, no AS, mild to moderate AR, borderline aortic root dilatation (3.9 cm).  Stress test on 08/21/11 showed normal myocardial perfusion, EF 62%.  Low risk scan.  He was ultimately cleared with intermediate CV risk.  CXR on 09/04/11 showed: No evidence of pulmonary edema, pneumonia, or pleural effusion. Slight hyperinflation configuration. Osteophytes are present in the spine.  Labs noted.  BUN/Cr 31/1.70, glucose 295, PLT 124, H/H 12.8/38.3.  I received comparison labs from Dr. Jannetta Quint office.  BUN/Cr were 28/1.46 and HgbA1C was 6.2 on 12/04/10.  Repeat BMET pre-operatively, if stable, plan to proceed.  Shonna Chock, PA-C

## 2011-09-11 MED ORDER — SODIUM CHLORIDE 0.9 % IV SOLN
INTRAVENOUS | Status: DC
  Administered 2011-09-12 (×2): via INTRAVENOUS

## 2011-09-11 MED ORDER — DEXTROSE 5 % IV SOLN
1.5000 g | INTRAVENOUS | Status: AC
  Administered 2011-09-12: 1.5 g via INTRAVENOUS
  Filled 2011-09-11: qty 1.5

## 2011-09-12 ENCOUNTER — Inpatient Hospital Stay (HOSPITAL_COMMUNITY)
Admission: RE | Admit: 2011-09-12 | Discharge: 2011-09-17 | DRG: 038 | Disposition: A | Discharge status: Skilled Nursing Facility | Payer: Medicare Other | Source: Ambulatory Visit | Attending: Vascular Surgery | Admitting: Vascular Surgery

## 2011-09-12 ENCOUNTER — Encounter (HOSPITAL_COMMUNITY)
Admission: RE | Disposition: A | Discharge status: Skilled Nursing Facility | Payer: Self-pay | Source: Ambulatory Visit | Attending: Vascular Surgery

## 2011-09-12 ENCOUNTER — Encounter (HOSPITAL_COMMUNITY): Payer: Self-pay | Admitting: Vascular Surgery

## 2011-09-12 ENCOUNTER — Ambulatory Visit (HOSPITAL_COMMUNITY): Payer: Medicare Other | Admitting: Vascular Surgery

## 2011-09-12 DIAGNOSIS — Z8673 Personal history of transient ischemic attack (TIA), and cerebral infarction without residual deficits: Secondary | ICD-10-CM

## 2011-09-12 DIAGNOSIS — F29 Unspecified psychosis not due to a substance or known physiological condition: Secondary | ICD-10-CM | POA: Diagnosis present

## 2011-09-12 DIAGNOSIS — Z7982 Long term (current) use of aspirin: Secondary | ICD-10-CM

## 2011-09-12 DIAGNOSIS — Z8546 Personal history of malignant neoplasm of prostate: Secondary | ICD-10-CM

## 2011-09-12 DIAGNOSIS — I6529 Occlusion and stenosis of unspecified carotid artery: Secondary | ICD-10-CM

## 2011-09-12 DIAGNOSIS — F341 Dysthymic disorder: Secondary | ICD-10-CM | POA: Diagnosis present

## 2011-09-12 DIAGNOSIS — D62 Acute posthemorrhagic anemia: Secondary | ICD-10-CM | POA: Diagnosis not present

## 2011-09-12 DIAGNOSIS — I10 Essential (primary) hypertension: Secondary | ICD-10-CM | POA: Diagnosis present

## 2011-09-12 DIAGNOSIS — I059 Rheumatic mitral valve disease, unspecified: Secondary | ICD-10-CM | POA: Diagnosis present

## 2011-09-12 DIAGNOSIS — K219 Gastro-esophageal reflux disease without esophagitis: Secondary | ICD-10-CM | POA: Diagnosis present

## 2011-09-12 DIAGNOSIS — E119 Type 2 diabetes mellitus without complications: Secondary | ICD-10-CM | POA: Diagnosis present

## 2011-09-12 DIAGNOSIS — H3530 Unspecified macular degeneration: Secondary | ICD-10-CM | POA: Diagnosis present

## 2011-09-12 HISTORY — DX: Frequency of micturition: R35.0

## 2011-09-12 HISTORY — PX: CAROTID ENDARTERECTOMY: SUR193

## 2011-09-12 HISTORY — DX: Constipation, unspecified: K59.00

## 2011-09-12 HISTORY — PX: ENDARTERECTOMY: SHX5162

## 2011-09-12 HISTORY — DX: Cardiac murmur, unspecified: R01.1

## 2011-09-12 HISTORY — DX: Other amnesia: R41.3

## 2011-09-12 HISTORY — DX: Dysphonia: R49.0

## 2011-09-12 HISTORY — DX: Nocturia: R35.1

## 2011-09-12 LAB — GLUCOSE, CAPILLARY: Glucose-Capillary: 137 mg/dL — ABNORMAL HIGH (ref 70–99)

## 2011-09-12 LAB — BASIC METABOLIC PANEL
BUN: 28 mg/dL — ABNORMAL HIGH (ref 6–23)
CO2: 30 mEq/L (ref 19–32)
Chloride: 103 mEq/L (ref 96–112)
Creatinine, Ser: 1.55 mg/dL — ABNORMAL HIGH (ref 0.50–1.35)

## 2011-09-12 SURGERY — ENDARTERECTOMY, CAROTID
Anesthesia: General | Site: Neck | Laterality: Right | Wound class: Clean

## 2011-09-12 MED ORDER — DOCUSATE SODIUM 100 MG PO CAPS
100.0000 mg | ORAL_CAPSULE | Freq: Every day | ORAL | Status: DC
  Administered 2011-09-13 – 2011-09-17 (×5): 100 mg via ORAL
  Filled 2011-09-12 (×5): qty 1

## 2011-09-12 MED ORDER — HYDRALAZINE HCL 20 MG/ML IJ SOLN
INTRAMUSCULAR | Status: AC
  Filled 2011-09-12: qty 1

## 2011-09-12 MED ORDER — HYDROMORPHONE HCL PF 1 MG/ML IJ SOLN: 0.2500 mg | INTRAMUSCULAR | Status: DC | PRN

## 2011-09-12 MED ORDER — ALUM & MAG HYDROXIDE-SIMETH 200-200-20 MG/5ML PO SUSP: 15.0000 mL | ORAL | Status: DC | PRN

## 2011-09-12 MED ORDER — METOPROLOL TARTRATE 1 MG/ML IV SOLN: 2.0000 mg | INTRAVENOUS | Status: DC | PRN

## 2011-09-12 MED ORDER — POTASSIUM CHLORIDE CRYS ER 20 MEQ PO TBCR: 20.0000 meq | EXTENDED_RELEASE_TABLET | Freq: Once | ORAL | Status: AC | PRN

## 2011-09-12 MED ORDER — BIMATOPROST 0.01 % OP SOLN
1.0000 [drp] | Freq: Every day | OPHTHALMIC | Status: DC
  Administered 2011-09-12 – 2011-09-16 (×5): 1 [drp] via OPHTHALMIC
  Filled 2011-09-12: qty 2.5

## 2011-09-12 MED ORDER — TIMOLOL HEMIHYDRATE 0.5 % OP SOLN: 1.0000 [drp] | Freq: Two times a day (BID) | OPHTHALMIC | Status: DC

## 2011-09-12 MED ORDER — DOPAMINE-DEXTROSE 3.2-5 MG/ML-% IV SOLN: 3.0000 ug/kg/min | INTRAVENOUS | Status: DC

## 2011-09-12 MED ORDER — HETASTARCH-ELECTROLYTES 6 % IV SOLN
INTRAVENOUS | Status: DC | PRN
  Administered 2011-09-12: 14:00:00 via INTRAVENOUS

## 2011-09-12 MED ORDER — LIDOCAINE HCL (CARDIAC) 20 MG/ML IV SOLN
INTRAVENOUS | Status: DC | PRN
  Administered 2011-09-12: 80 mg via INTRAVENOUS

## 2011-09-12 MED ORDER — TAMSULOSIN HCL 0.4 MG PO CAPS
0.4000 mg | ORAL_CAPSULE | Freq: Every day | ORAL | Status: DC
  Administered 2011-09-13 – 2011-09-17 (×5): 0.4 mg via ORAL
  Filled 2011-09-12 (×5): qty 1

## 2011-09-12 MED ORDER — SODIUM CHLORIDE 0.9 % IV SOLN
INTRAVENOUS | Status: DC
  Administered 2011-09-12 (×2): via INTRAVENOUS

## 2011-09-12 MED ORDER — ASPIRIN 81 MG PO CHEW
81.0000 mg | CHEWABLE_TABLET | Freq: Every day | ORAL | Status: DC
  Administered 2011-09-12 – 2011-09-17 (×6): 81 mg via ORAL
  Filled 2011-09-12 (×6): qty 1

## 2011-09-12 MED ORDER — GLYCOPYRROLATE 0.2 MG/ML IJ SOLN
INTRAMUSCULAR | Status: DC | PRN
  Administered 2011-09-12: 0.6 mg via INTRAVENOUS

## 2011-09-12 MED ORDER — PROPOFOL 10 MG/ML IV EMUL
INTRAVENOUS | Status: DC | PRN
  Administered 2011-09-12: 50 mg via INTRAVENOUS
  Administered 2011-09-12: 150 mg via INTRAVENOUS

## 2011-09-12 MED ORDER — IRBESARTAN 150 MG PO TABS
150.0000 mg | ORAL_TABLET | Freq: Every day | ORAL | Status: DC
  Administered 2011-09-12 – 2011-09-17 (×6): 150 mg via ORAL
  Filled 2011-09-12 (×6): qty 1

## 2011-09-12 MED ORDER — OXYCODONE HCL 5 MG PO TABS: 5.0000 mg | ORAL_TABLET | Freq: Four times a day (QID) | ORAL | Status: AC | PRN

## 2011-09-12 MED ORDER — TEMAZEPAM 15 MG PO CAPS
15.0000 mg | ORAL_CAPSULE | Freq: Every evening | ORAL | Status: DC | PRN
  Administered 2011-09-13: 15 mg via ORAL
  Filled 2011-09-12: qty 1

## 2011-09-12 MED ORDER — GUAIFENESIN-DM 100-10 MG/5ML PO SYRP: 15.0000 mL | ORAL_SOLUTION | ORAL | Status: DC | PRN

## 2011-09-12 MED ORDER — LABETALOL HCL 5 MG/ML IV SOLN
10.0000 mg | INTRAVENOUS | Status: DC | PRN
  Filled 2011-09-12: qty 4

## 2011-09-12 MED ORDER — 0.9 % SODIUM CHLORIDE (POUR BTL) OPTIME
TOPICAL | Status: DC | PRN
  Administered 2011-09-12: 1000 mL

## 2011-09-12 MED ORDER — HYDROCHLOROTHIAZIDE 12.5 MG PO CAPS
12.5000 mg | ORAL_CAPSULE | Freq: Every day | ORAL | Status: DC
  Administered 2011-09-12 – 2011-09-17 (×6): 12.5 mg via ORAL
  Filled 2011-09-12 (×6): qty 1

## 2011-09-12 MED ORDER — SODIUM CHLORIDE 0.9 % IR SOLN
Status: DC | PRN
  Administered 2011-09-12: 14:00:00

## 2011-09-12 MED ORDER — AMLODIPINE BESYLATE 10 MG PO TABS
10.0000 mg | ORAL_TABLET | Freq: Every day | ORAL | Status: DC
  Administered 2011-09-13 – 2011-09-17 (×5): 10 mg via ORAL
  Filled 2011-09-12 (×5): qty 1

## 2011-09-12 MED ORDER — ONDANSETRON HCL 4 MG/2ML IJ SOLN: 4.0000 mg | Freq: Four times a day (QID) | INTRAMUSCULAR | Status: DC | PRN

## 2011-09-12 MED ORDER — PHENOL 1.4 % MT LIQD: 1.0000 | OROMUCOSAL | Status: DC | PRN

## 2011-09-12 MED ORDER — INSULIN ASPART 100 UNIT/ML ~~LOC~~ SOLN
0.0000 [IU] | Freq: Three times a day (TID) | SUBCUTANEOUS | Status: DC
  Administered 2011-09-13 – 2011-09-14 (×4): 2 [IU] via SUBCUTANEOUS
  Administered 2011-09-14: 1 [IU] via SUBCUTANEOUS
  Administered 2011-09-15: 2 [IU] via SUBCUTANEOUS
  Administered 2011-09-15 – 2011-09-16 (×3): 1 [IU] via SUBCUTANEOUS
  Administered 2011-09-16: 5 [IU] via SUBCUTANEOUS
  Administered 2011-09-16: 1 [IU] via SUBCUTANEOUS
  Administered 2011-09-17 (×2): 2 [IU] via SUBCUTANEOUS

## 2011-09-12 MED ORDER — OXYCODONE HCL 5 MG PO TABS: 5.0000 mg | ORAL_TABLET | ORAL | Status: DC | PRN

## 2011-09-12 MED ORDER — HYDRALAZINE HCL 20 MG/ML IJ SOLN
10.0000 mg | INTRAMUSCULAR | Status: DC | PRN
  Administered 2011-09-12: 10 mg via INTRAVENOUS
  Filled 2011-09-12: qty 0.5

## 2011-09-12 MED ORDER — OLMESARTAN MEDOXOMIL-HCTZ 20-12.5 MG PO TABS: 1.0000 | ORAL_TABLET | Freq: Every day | ORAL | Status: DC

## 2011-09-12 MED ORDER — PHENYLEPHRINE HCL 10 MG/ML IJ SOLN
10.0000 mg | INTRAVENOUS | Status: DC | PRN
  Administered 2011-09-12: 10 ug/min via INTRAVENOUS

## 2011-09-12 MED ORDER — TIMOLOL MALEATE 0.5 % OP SOLN
1.0000 [drp] | Freq: Two times a day (BID) | OPHTHALMIC | Status: DC
  Administered 2011-09-12 – 2011-09-17 (×10): 1 [drp] via OPHTHALMIC
  Filled 2011-09-12: qty 5

## 2011-09-12 MED ORDER — POTASSIUM CHLORIDE IN NACL 20-0.9 MEQ/L-% IV SOLN
INTRAVENOUS | Status: DC
  Administered 2011-09-12: 75 mL/h via INTRAVENOUS
  Filled 2011-09-12 (×7): qty 1000

## 2011-09-12 MED ORDER — ONDANSETRON HCL 4 MG/2ML IJ SOLN
INTRAMUSCULAR | Status: DC | PRN
  Administered 2011-09-12: 4 mg via INTRAVENOUS

## 2011-09-12 MED ORDER — DEXTROSE 5 % IV SOLN
1.5000 g | Freq: Two times a day (BID) | INTRAVENOUS | Status: AC
  Administered 2011-09-13 (×2): 1.5 g via INTRAVENOUS
  Filled 2011-09-12 (×2): qty 1.5

## 2011-09-12 MED ORDER — ACETAMINOPHEN 650 MG RE SUPP: 325.0000 mg | RECTAL | Status: DC | PRN

## 2011-09-12 MED ORDER — HEPARIN SODIUM (PORCINE) 1000 UNIT/ML IJ SOLN
INTRAMUSCULAR | Status: DC | PRN
  Administered 2011-09-12: 3000 [IU] via INTRAVENOUS
  Administered 2011-09-12: 9000 [IU] via INTRAVENOUS

## 2011-09-12 MED ORDER — SODIUM CHLORIDE 0.9 % IV SOLN: 500.0000 mL | Freq: Once | INTRAVENOUS | Status: AC | PRN

## 2011-09-12 MED ORDER — ASPIRIN 81 MG PO TABS: 81.0000 mg | ORAL_TABLET | Freq: Every day | ORAL | Status: DC

## 2011-09-12 MED ORDER — INSULIN ASPART 100 UNIT/ML ~~LOC~~ SOLN: 0.0000 [IU] | Freq: Every day | SUBCUTANEOUS | Status: DC

## 2011-09-12 MED ORDER — VECURONIUM BROMIDE 10 MG IV SOLR
INTRAVENOUS | Status: DC | PRN
  Administered 2011-09-12: 1 mg via INTRAVENOUS
  Administered 2011-09-12: 2 mg via INTRAVENOUS
  Administered 2011-09-12: 1 mg via INTRAVENOUS

## 2011-09-12 MED ORDER — MORPHINE SULFATE 2 MG/ML IJ SOLN: 2.0000 mg | INTRAMUSCULAR | Status: DC | PRN

## 2011-09-12 MED ORDER — PANTOPRAZOLE SODIUM 40 MG PO TBEC
40.0000 mg | DELAYED_RELEASE_TABLET | Freq: Every day | ORAL | Status: DC
  Administered 2011-09-13 – 2011-09-16 (×4): 40 mg via ORAL
  Filled 2011-09-12 (×4): qty 1

## 2011-09-12 MED ORDER — NEOSTIGMINE METHYLSULFATE 1 MG/ML IJ SOLN
INTRAMUSCULAR | Status: DC | PRN
  Administered 2011-09-12: 4 mg via INTRAVENOUS

## 2011-09-12 MED ORDER — ACETAMINOPHEN 325 MG PO TABS: 325.0000 mg | ORAL_TABLET | ORAL | Status: DC | PRN

## 2011-09-12 MED ORDER — GLIMEPIRIDE 1 MG PO TABS
1.0000 mg | ORAL_TABLET | Freq: Every day | ORAL | Status: DC
  Administered 2011-09-13 – 2011-09-17 (×5): 1 mg via ORAL
  Filled 2011-09-12 (×6): qty 1

## 2011-09-12 MED ORDER — ROCURONIUM BROMIDE 100 MG/10ML IV SOLN
INTRAVENOUS | Status: DC | PRN
  Administered 2011-09-12: 50 mg via INTRAVENOUS

## 2011-09-12 MED ORDER — FENTANYL CITRATE 0.05 MG/ML IJ SOLN
INTRAMUSCULAR | Status: DC | PRN
  Administered 2011-09-12 (×2): 50 ug via INTRAVENOUS
  Administered 2011-09-12 (×2): 100 ug via INTRAVENOUS
  Administered 2011-09-12: 50 ug via INTRAVENOUS

## 2011-09-12 MED ORDER — PROTAMINE SULFATE 10 MG/ML IV SOLN
INTRAVENOUS | Status: DC | PRN
  Administered 2011-09-12 (×9): 10 mg via INTRAVENOUS

## 2011-09-12 SURGICAL SUPPLY — 52 items
CANISTER SUCTION 2500CC (MISCELLANEOUS) ×2 IMPLANT
CANNULA VESSEL W/WING WO/VALVE (CANNULA) ×2 IMPLANT
CATH ROBINSON RED A/P 18FR (CATHETERS) ×2 IMPLANT
CLIP TI MEDIUM 24 (CLIP) ×2 IMPLANT
CLIP TI WIDE RED SMALL 24 (CLIP) ×2 IMPLANT
CLOTH BEACON ORANGE TIMEOUT ST (SAFETY) ×2 IMPLANT
COVER SURGICAL LIGHT HANDLE (MISCELLANEOUS) ×4 IMPLANT
CRADLE DONUT ADULT HEAD (MISCELLANEOUS) ×2 IMPLANT
DECANTER SPIKE VIAL GLASS SM (MISCELLANEOUS) IMPLANT
DERMABOND ADVANCED (GAUZE/BANDAGES/DRESSINGS) ×1
DERMABOND ADVANCED .7 DNX12 (GAUZE/BANDAGES/DRESSINGS) ×1 IMPLANT
DRAIN HEMOVAC 1/8 X 5 (WOUND CARE) IMPLANT
DRAPE WARM FLUID 44X44 (DRAPE) ×2 IMPLANT
DRSG COVADERM 4X8 (GAUZE/BANDAGES/DRESSINGS) ×2 IMPLANT
ELECT REM PT RETURN 9FT ADLT (ELECTROSURGICAL) ×2
ELECTRODE REM PT RTRN 9FT ADLT (ELECTROSURGICAL) ×1 IMPLANT
EVACUATOR SILICONE 100CC (DRAIN) IMPLANT
GEL ULTRASOUND 20GR AQUASONIC (MISCELLANEOUS) IMPLANT
GLOVE BIO SURGEON STRL SZ7.5 (GLOVE) ×2 IMPLANT
GLOVE BIOGEL M 6.5 STRL (GLOVE) ×4 IMPLANT
GLOVE BIOGEL PI IND STRL 6.5 (GLOVE) ×1 IMPLANT
GLOVE BIOGEL PI IND STRL 7.5 (GLOVE) ×3 IMPLANT
GLOVE BIOGEL PI INDICATOR 6.5 (GLOVE) ×1
GLOVE BIOGEL PI INDICATOR 7.5 (GLOVE) ×3
GLOVE SURG SS PI 7.0 STRL IVOR (GLOVE) ×2 IMPLANT
GLOVE SURG SS PI 7.5 STRL IVOR (GLOVE) ×6 IMPLANT
GOWN PREVENTION PLUS XLARGE (GOWN DISPOSABLE) ×4 IMPLANT
GOWN STRL NON-REIN LRG LVL3 (GOWN DISPOSABLE) ×6 IMPLANT
KIT BASIN OR (CUSTOM PROCEDURE TRAY) ×2 IMPLANT
KIT ROOM TURNOVER OR (KITS) ×2 IMPLANT
LOOP VESSEL MINI RED (MISCELLANEOUS) IMPLANT
NEEDLE HYPO 25GX1X1/2 BEV (NEEDLE) IMPLANT
NS IRRIG 1000ML POUR BTL (IV SOLUTION) ×4 IMPLANT
PACK CAROTID (CUSTOM PROCEDURE TRAY) ×2 IMPLANT
PAD ARMBOARD 7.5X6 YLW CONV (MISCELLANEOUS) ×4 IMPLANT
PATCH HEMASHIELD 8X75 (Vascular Products) ×2 IMPLANT
SHUNT CAROTID BYPASS 10 (VASCULAR PRODUCTS) ×2 IMPLANT
SHUNT CAROTID BYPASS 12FRX15.5 (VASCULAR PRODUCTS) IMPLANT
SPECIMEN JAR SMALL (MISCELLANEOUS) ×2 IMPLANT
SPONGE SURGIFOAM ABS GEL 100 (HEMOSTASIS) IMPLANT
SUT ETHILON 3 0 PS 1 (SUTURE) IMPLANT
SUT PROLENE 6 0 CC (SUTURE) ×2 IMPLANT
SUT PROLENE 7 0 BV 1 (SUTURE) IMPLANT
SUT PROLENE 7 0 BV1 MDA (SUTURE) ×2 IMPLANT
SUT VIC AB 3-0 SH 27 (SUTURE) ×1
SUT VIC AB 3-0 SH 27X BRD (SUTURE) ×1 IMPLANT
SUT VICRYL 4-0 PS2 18IN ABS (SUTURE) ×2 IMPLANT
SYR CONTROL 10ML LL (SYRINGE) IMPLANT
TOWEL OR 17X24 6PK STRL BLUE (TOWEL DISPOSABLE) ×2 IMPLANT
TOWEL OR 17X26 10 PK STRL BLUE (TOWEL DISPOSABLE) ×2 IMPLANT
TRAY FOLEY CATH 14FRSI W/METER (CATHETERS) ×2 IMPLANT
WATER STERILE IRR 1000ML POUR (IV SOLUTION) ×2 IMPLANT

## 2011-09-12 NOTE — Anesthesia Postprocedure Evaluation (Signed)
Anesthesia Post Note  Patient: Ronald Hunt  Procedure(s) Performed: Procedure(s) (LRB): ENDARTERECTOMY CAROTID (Right)  Anesthesia type: General  Patient location: PACU  Post pain: Pain level controlled and Adequate analgesia  Post assessment: Post-op Vital signs reviewed, Patient's Cardiovascular Status Stable, Respiratory Function Stable, Patent Airway and Pain level controlled  Last Vitals:  Filed Vitals:   09/12/11 1643  BP: 148/70  Pulse: 74  Temp: 36.7 C  Resp: 24    Post vital signs: Reviewed and stable  Level of consciousness: awake, alert  and oriented  Complications: No apparent anesthesia complications

## 2011-09-12 NOTE — Progress Notes (Signed)
Patient's CBG at 22:00 was 164; paged Dr. Hart Rochester on call to get orders for Glycemic Control Order Set.  Orders received; Will continue to monitor.  Vivi Martens RN

## 2011-09-12 NOTE — H&P (View-Only) (Signed)
[facility] [place] HISTORY AND PHYSICAL   CC: 78 year old male patient of Dr. Darrick Penna followed for known carotid stenosis. Referring Physician: Fields  History of Present Illness: 78 year old well patient of Dr. Darrick Penna who is scheduled for a right CEA on 09/12/2011. The patient denies any signs or symptoms of CVA, TIA, amaurosis fugax or any neural deficit at this point. The patient and his wife deny any new medical diagnoses recent surgeries or change in medication.  Past Medical History  Diagnosis Date  . Diabetes mellitus   . Hypertension   . Cancer     prostate  . Depression   . Anxiety   . Dizziness   . Macular degeneration   . History of TIAs   . Carotid artery occlusion   . Glaucoma   . Stroke     TIA  . GERD (gastroesophageal reflux disease)   . Mitral valvular disorder     ROS: [x]  Positive   [ ]  Denies    General: [ ]  Weight loss, [ ]  Fever, [ ]  chills Neurologic: [ ]  Dizziness, [ ]  Blackouts, [ ]  Seizure [ ]  Stroke, [ ]  "Mini stroke", [ ]  Slurred speech, [ ]  Temporary blindness; [ ]  weakness in arms or legs, [ ]  Hoarseness Cardiac: [ ]  Chest pain/pressure, [ ]  Shortness of breath at rest [ ]  Shortness of breath with exertion, [ ]  Atrial fibrillation or irregular heartbeat Vascular: [ ]  Pain in legs with walking, [ ]  Pain in legs at rest, [ ]  Pain in legs at night,  [ ]  Non-healing ulcer, [ ]  Blood clot in vein/DVT,   Pulmonary: [ ]  Home oxygen, [ ]  Productive cough, [ ]  Coughing up blood, [ ]  Asthma,  [ ]  Wheezing Musculoskeletal:  [ ]  Arthritis, [ ]  Low back pain, [ ]  Joint pain Hematologic: [ ]  Easy Bruising, [ ]  Anemia; [ ]  Hepatitis Gastrointestinal: [ ]  Blood in stool, [ ]  Gastroesophageal Reflux/heartburn, [ ]  Trouble swallowing Urinary: [ ]  chronic Kidney disease, [ ]  on HD - [ ]  MWF or [ ]  TTHS, [ ]  Burning with urination, [ ]  Difficulty urinating Skin: [ ]  Rashes, [ ]  Wounds Psychological: [ ]  Anxiety, [ ]  Depression   Social  History History  Substance Use Topics  . Smoking status: Never Smoker   . Smokeless tobacco: Not on file  . Alcohol Use: Yes     rarely    Family History Family History  Problem Relation Age of Onset  . Cancer Brother   . Crohn's disease Son     No Known Allergies  Current Outpatient Prescriptions  Medication Sig Dispense Refill  . amLODipine (NORVASC) 10 MG tablet Take 10 mg by mouth daily.        Marland Kitchen aspirin 81 MG tablet Take 81 mg by mouth daily.      Marland Kitchen BETIMOL 0.5 % ophthalmic solution Place 1 drop into both eyes daily.       Marland Kitchen glimepiride (AMARYL) 1 MG tablet Take 1 mg by mouth daily before breakfast.       . LUMIGAN 0.01 % SOLN Place 1 drop into both eyes at bedtime.       Marland Kitchen olmesartan-hydrochlorothiazide (BENICAR HCT) 20-12.5 MG per tablet Take 1 tablet by mouth daily.        . Tamsulosin HCl (FLOMAX) 0.4 MG CAPS Take 0.4 mg by mouth daily.         Physical Examination  Filed Vitals:   08/30/11 1353  BP: 143/73  Pulse: 61  Resp:     Body mass index is 24.87 kg/(m^2).  General:  WDWN in NAD Gait: Normal HENT: WNL Eyes: Pupils equal Pulmonary: normal non-labored breathing , without Rales, rhonchi,  wheezing Cardiac: RRR, without  Murmurs, rubs or gallops; No carotid bruits Abdomen: soft, NT, no masses Skin: no rashes, ulcers noted Vascular Exam/Pulses: 2+ radial pulses bilaterally, carotid pulses to auscultation, very mild right sided bruit  Extremities without ischemic changes, no Gangrene , no cellulitis; no open wounds;  Musculoskeletal: no muscle wasting or atrophy  Neurologic: A&O X 3; Appropriate Affect ; SENSATION: normal; MOTOR FUNCTION:  moving all extremities equally. Speech is fluent/normal   ASSESSMENT/PLAN: Asymptomatic 78 year old well patient for right carotid CEA 09/12/2011 with Dr. Darrick Penna. Postop followup will be pending that procedure. The patient and his wife's questions were encouraged and answered.  Lauree Chandler ANP  Clinic M.D.:  Fields

## 2011-09-12 NOTE — Op Note (Signed)
Procedure Right carotid endarterectomy  Preoperative diagnosis: High-grade asymptomatic right  internal carotid artery stenosis  Postoperative diagnosis: Same  Anesthesia General  Asst.: Doreatha Massed, PAC  Operative findings: #1 greater than 90% right internal carotid artery stenosis                                #2 Dacron patch extending onto the distal internal carotid artery just below the hypoglossal nerve  Operative details: After obtaining informed consent, the patient was taken to the operating room. The patient was placed in a supine position on the operating room table. After induction of general anesthesia and endotracheal intubation a Foley catheter was placed. Next the patient's entire neck and chest was prepped and draped in the usual sterile fashion. An oblique incision was made on the right aspect of the patient's neck anterior to the border the right sternocleidomastoid muscle. The incision was carried into the subcutaneous tissues and through the platysma. The sternocleidomastoid muscle was identified and reflected laterally. The omohyoid muscle was identified and this was divided with cautery. The common carotid artery was then found at the base of the incision this was dissected free circumferentially. It was fairly soft on palpation.  The vagus nerve was identified and protected. Dissection was then carried up to the level of the carotid bifurcation. The Ansa cervicalis was identified and traced up to its insertion into the hypoglossal nerve.  The hyperglossal nerve was protected. The internal carotid artery was dissected free circumferentially just below the level of the hypoglossal nerve and it was soft in character at this location and above any palpable disease. A vessel loop was placed around this. Next the external carotid and superior thyroid arteries were dissected free circumferentially and vessel loops were placed around these. The patient was given 10000 units of  intravenous heparin. He was given an additional 3000 units of heparin during the course of the case.  After 2 minutes of circulation time and raising the mean arterial pressure to 85 mm mercury, the distal internal carotid artery was controlled with small bulldog clamp. The external carotid and superior thyroid arteries were controlled with vessel loops. The common carotid artery was controlled with a peripheral DeBakey clamp. A longitudinal opening was made in the common carotid artery just below the bifurcation. The arteriotomy was extended distally up into the internal carotid with Potts scissors. There was a large calcified plaque with greater than 90% stenosis.   The arteriotomy was extended past the level of stenosis. Next a 10 Fr shunt was brought up on the operative field and threaded into the distal internal carotid artery and allowed to backbleed thoroughly.  This was then threaded into the internal carotid artery and secured with a Rummel tourniquet.   The shunt was inspected and found to be free of air and opened with restoration of circulation after approximately 5 minutes.  There was some leaking and backbleeding around the distal internal carotid and this was controlled with a Javid shunt clamp.   Attention was then turned to the common carotid artery once again. A suitable endarterectomy plane was obtained and endarterectomy was begun in the common carotid artery and a reasonable proximal endpoint was obtained. An eversion endarterectomy was performed on the external carotid artery and a good endpoint was obtained. The plaque was then elevated in the internal carotid artery and a nice feathered distal endpoint was also obtained. However the endpoint was lifting  slightly so it was tacked posteriorly with a 7 0 Prolene suture.  The plaque was passed off the table as a specimen. All loose debris was then removed from the carotid bed and everything was thoroughly irrigated with heparinized saline. A  Dacron patch was then brought on to the operative field and this was sewn on as a patch angioplasty using a running 6-0 Prolene suture. Prior to completion of the anastomosis the shunt was reoccluded and brought down out of the internal carotid artery and this was thoroughly backbled and reoccluded with a small bulldog clamp. The shunt was removed from the common carotid and this was thoroughly flushed forward then reoccluded with a peripheral Debakey clamp. The external carotid was also thoroughly backbled.  The remainder of the patch was completed and the anastomosis was secured. Flow was then restored first retrograde from the external carotid into the carotid bed then antegrade from the common carotid to the external carotid artery and after approximately 5 cardiac cycles to the internal carotid artery. Doppler was used to evaluate the external/internal and common carotid arteries and these all had good Doppler flow. Hemostasis was obtained. The patient was also given 100 mg of Protamine.     The platysma muscle was reapproximated using a running 3-0 Vicryl suture. The skin was closed with 4 0 Vicryl subcuticular stitch.  The patient was awakened in the operating room and was moving upper and lower extremities symmetrically and following commands.  The patient was stable on arrival to the PACU.  Fabienne Bruns, MD [facility] [place] Office: [phone] Pager: [phone]

## 2011-09-12 NOTE — Discharge Summary (Signed)
[facility] Discharge Summary  Ronald Hunt Oct 04, 1923 78 y.o. male  [id number]  Admission Date: 09/12/2011  Discharge Date: 09-17-2011  Physician: Sherren Kerns, MD  Admission Diagnosis: ica stenosis   HPI:   This is a 78 y.o. male who is scheduled for a right CEA on 09/12/2011. The patient denies any signs or symptoms of CVA, TIA, amaurosis fugax or any neural deficit at this point. The patient and his wife deny any new medical diagnoses recent surgeries or change in medication.  Hospital Course:  The patient was admitted to the hospital and taken to the operating room on 09/12/2011 and underwent right carotid endarterectomy.  The pt tolerated the procedure well and was transported to the PACU in good condition.   By POD 1, the pt neuro status in tact.  He was however confused overnight.  He did continue to mobilize and we kept him in the hospital b/c of his confusion that first night.  By POD 2, he was still confused, but it was somewhat better.  He also has a small hematoma at his neck incision, but this is not obstructing his airway.  He was to be d/c'd home on POD 2, but it was decided by his family to place him in a SNF.    The remainder of the hospital course consisted of increasing mobilization and increasing intake of solids without difficulty.    Basename 09/12/11 0839  NA 141  K 4.2  CL 103  CO2 30  GLUCOSE 153*  BUN 28*  CALCIUM 9.7   No results found for this basename: WBC:2,HGB:2,HCT:2,PLT:2 in the last 72 hours No results found for this basename: INR:2 in the last 72 hours   Discharge Instructions:   The patient is discharged to home with extensive instructions on wound care and progressive ambulation.  They are instructed not to drive or perform any heavy lifting until returning to see the physician in his office.  Discharge Orders    Future Orders Please Complete By Expires   Resume previous diet      Driving Restrictions       Comments:   No driving for 2 weeks and while taking pain medication   Lifting restrictions      Comments:   No lifting for 6 weeks   Call MD for:  temperature >100.5      Call MD for:  redness, tenderness, or signs of infection (pain, swelling, bleeding, redness, odor or green/yellow discharge around incision site)      Call MD for:  severe or increased pain, loss or decreased feeling  in affected limb(s)      Discharge wound care:      Comments:   Shower daily with soap and water starting 09/13/11   CAROTID Sugery: Call MD for difficulty swallowing or speaking; weakness in arms or legs that is a new symtom; severe headache.  If you have increased swelling in the neck and/or  are having difficulty breathing, CALL 911         Discharge Diagnosis:  ica stenosis  Secondary Diagnosis: Patient Active Problem List  Diagnosis  . Carotid stenosis  . Occlusion and stenosis of carotid artery without mention of cerebral infarction   Past Medical History  Diagnosis Date  . Dizziness   . Macular degeneration   . History of TIAs   . Carotid artery occlusion   . Glaucoma   . Stroke     TIA  . Mitral valvular disorder   .  Hypertension     takes Amlodipine and Benicar daily  . Heart murmur   . Hoarseness     states pretty much all the time  . Diabetes mellitus     takes Glimepiride daily  . GERD (gastroesophageal reflux disease)     doesn't require meds  . Constipation     occasionally Mag Citrate  . Urinary frequency   . Cancer     prostate;takes Flomax daily  . Nocturia   . Depression     but doesn't require meds  . Anxiety     doesn't take any meds  . Short-term memory loss   . Long-term memory loss   . Aortic insufficiency     mild-moderate AR by echo 07/2011 (Dr. Rennis Golden)     Ronald Hunt, Ronald Hunt  Home Medication Instructions [id number]   Printed on:09/12/11 1557  Medication Information                    amLODipine (NORVASC) 10 MG tablet Take 10 mg by mouth daily.              olmesartan-hydrochlorothiazide (BENICAR HCT) 20-12.5 MG per tablet Take 1 tablet by mouth daily.             LUMIGAN 0.01 % SOLN Place 1 drop into both eyes at bedtime.            glimepiride (AMARYL) 1 MG tablet Take 1 mg by mouth daily before breakfast.            Tamsulosin HCl (FLOMAX) 0.4 MG CAPS Take 0.4 mg by mouth daily.            BETIMOL 0.5 % ophthalmic solution Place 1 drop into both eyes 2 (two) times daily.            aspirin 81 MG tablet Take 81 mg by mouth daily.           acetaminophen (TYLENOL) 500 MG tablet Take 1,000 mg by mouth every 6 (six) hours as needed.           oxyCODONE (ROXICODONE) 5 MG immediate release tablet Take 1 tablet (5 mg total) by mouth every 6 (six) hours as needed for pain. #30 NR            Disposition: SNF  Patient's condition: is Good  Follow up: 1. Dr.  Darrick Penna in 4 weeks.   Lianne Cure, PA-C [facility] [phone] 09/12/2011  3:57 PM

## 2011-09-12 NOTE — Transfer of Care (Signed)
Immediate Anesthesia Transfer of Care Note  Patient: Ronald Hunt  Procedure(s) Performed: Procedure(s) (LRB): ENDARTERECTOMY CAROTID (Right)  Patient Location: PACU  Anesthesia Type: General  Level of Consciousness: awake, alert  and patient cooperative  Airway & Oxygen Therapy: Patient Spontanous Breathing and Patient connected to nasal cannula oxygen  Post-op Assessment: Report given to PACU RN and Patient moving all extremities X 4  Post vital signs: Reviewed and stable  Complications: No apparent anesthesia complications

## 2011-09-12 NOTE — Interval H&P Note (Signed)
History and Physical Interval Note:  09/12/2011 12:29 PM  Ronald Hunt  has presented today for surgery, with the diagnosis of ica stenosis  The various methods of treatment have been discussed with the patient and family. After consideration of risks, benefits and other options for treatment, the patient has consented to  Procedure(s) (LRB): ENDARTERECTOMY CAROTID (Right) as a surgical intervention .  The patient's history has been reviewed, patient examined, no change in status, stable for surgery.  I have reviewed the patients' chart and labs.  Questions were answered to the patient's satisfaction.     Lamon Rotundo E

## 2011-09-12 NOTE — Anesthesia Preprocedure Evaluation (Addendum)
Anesthesia Evaluation  Patient identified by MRN, date of birth, ID band Patient awake    Reviewed: Allergy & Precautions, H&P , NPO status , Patient's Chart, lab work & pertinent test results  Airway Mallampati: II  Neck ROM: full    Dental   Pulmonary          Cardiovascular hypertension, + Valvular Problems/Murmurs AI     Neuro/Psych Anxiety Depression CVA    GI/Hepatic GERD-  ,  Endo/Other  Diabetes mellitus-, Type 2  Renal/GU Renal InsufficiencyRenal disease     Musculoskeletal   Abdominal   Peds  Hematology   Anesthesia Other Findings   Reproductive/Obstetrics                          Anesthesia Physical Anesthesia Plan  ASA: III  Anesthesia Plan: General   Post-op Pain Management:    Induction: Intravenous  Airway Management Planned: Oral ETT  Additional Equipment: Arterial line  Intra-op Plan:   Post-operative Plan: Extubation in OR  Informed Consent: I have reviewed the patients History and Physical, chart, labs and discussed the procedure including the risks, benefits and alternatives for the proposed anesthesia with the patient or authorized representative who has indicated his/her understanding and acceptance.     Plan Discussed with: CRNA and Surgeon  Anesthesia Plan Comments:         Anesthesia Quick Evaluation

## 2011-09-12 NOTE — Preoperative (Signed)
Beta Blockers   Reason not to administer Beta Blockers:Not Applicable 

## 2011-09-13 ENCOUNTER — Telehealth: Payer: Self-pay | Admitting: Vascular Surgery

## 2011-09-13 LAB — CBC
HCT: 33.2 % — ABNORMAL LOW (ref 39.0–52.0)
MCH: 30.7 pg (ref 26.0–34.0)
MCHC: 34.6 g/dL (ref 30.0–36.0)
RDW: 13.8 % (ref 11.5–15.5)

## 2011-09-13 LAB — GLUCOSE, CAPILLARY
Glucose-Capillary: 152 mg/dL — ABNORMAL HIGH (ref 70–99)
Glucose-Capillary: 79 mg/dL (ref 70–99)
Glucose-Capillary: 189 mg/dL — ABNORMAL HIGH (ref 70–99)

## 2011-09-13 LAB — BASIC METABOLIC PANEL
BUN: 26 mg/dL — ABNORMAL HIGH (ref 6–23)
Calcium: 8.1 mg/dL — ABNORMAL LOW (ref 8.4–10.5)
Creatinine, Ser: 1.22 mg/dL (ref 0.50–1.35)
GFR calc Af Amer: 60 mL/min — ABNORMAL LOW (ref >90)
GFR calc non Af Amer: 52 mL/min — ABNORMAL LOW (ref >90)
Potassium: 3.8 mEq/L (ref 3.5–5.1)

## 2011-09-13 NOTE — Progress Notes (Signed)
[facility] Progress Note  09/13/2011 7:12 AM POD 1  Subjective:  Had some confusion over night.  Son concerned about pt not speaking and making a lot of sense.    Afebrile x 24 hours   VSS  96%RA Filed Vitals:   09/13/11 0400  BP: 147/67  Pulse: 75  Temp: 98.6 F (37 C)  Resp: 12     Physical Exam: Neuro:  In tact Incision:  C/d/i without drainage.  CBC    Component Value Date/Time   WBC 10.5 09/13/2011 0355   RBC 3.75* 09/13/2011 0355   HGB 11.5* 09/13/2011 0355   HCT 33.2* 09/13/2011 0355   PLT 125* 09/13/2011 0355   MCV 88.5 09/13/2011 0355   MCH 30.7 09/13/2011 0355   MCHC 34.6 09/13/2011 0355   RDW 13.8 09/13/2011 0355    BMET    Component Value Date/Time   NA 139 09/13/2011 0355   K 3.8 09/13/2011 0355   CL 107 09/13/2011 0355   CO2 25 09/13/2011 0355   GLUCOSE 160* 09/13/2011 0355   BUN 26* 09/13/2011 0355   CREATININE 1.22 09/13/2011 0355   CALCIUM 8.1* 09/13/2011 0355   GFRNONAA 52* 09/13/2011 0355   GFRAA 60* 09/13/2011 0355     Intake/Output Summary (Last 24 hours) at 09/13/11 0712 Last data filed at 09/13/11 0610  Gross per 24 hour  Intake   2775 ml  Output   1225 ml  Net   1550 ml      Assessment/Plan:  This is a 78 y.o. male who is s/p right CEA POD 1  -doing well this am neurologically.  -still with a bit of confusion, but seems to be better than last pm. -acute surgical blood loss anemia-tolerating.  Doreatha Massed, PA-C [facility] [phone]

## 2011-09-13 NOTE — Progress Notes (Signed)
Patient woke up from sleeping and was disoriented.  He tried to climb out of bed and set bed alarm off; upon assessing him he was found to think that he was in a hotel instead of the hospital.  Patient was reoriented and remembered having surgery earlier today.  However, his A-Line had been pulled enough that it was almost out due to patient trying to get out of bed and pulling on  wires and tubes.  A-line was removed and pressure held for 6 minutes.  Pressure dressing applied; vss and patient reoriented.  Will continue to monitor patient.    Vivi Martens

## 2011-09-13 NOTE — Progress Notes (Signed)
Utilization review completed.  

## 2011-09-13 NOTE — Progress Notes (Signed)
Clinical Social Work  Per progression meeting, patient lives alone and has been confused in the hospital. CSW reviewed chart which stated patient is a fall risk and remains confused. CSW is requesting that PT/OT be ordered to determine if patient would be safe to return home.   CSW called son Barbara Cower) to discuss dc plans since patient is confused. CSW left a message with CSW contact information to discuss plans with son. CSW will continue to follow and can assist with placement if needed.  [place], [place] [phone]

## 2011-09-13 NOTE — Telephone Encounter (Signed)
Spoke with patients wife re: follow up and sent letter, dpm

## 2011-09-13 NOTE — Progress Notes (Signed)
Brief Nutrition Note  Patient triggered on Nutrition Risk report for difficulty swallowing.  Patient reports no problems chewing or swallowing.  Ate around 50% of breakfast today.  Appetite is slowly coming back.  No nutrition problems identified.  No nutrition intervention needed at this time.  Hettie Holstein [phone]

## 2011-09-13 NOTE — Telephone Encounter (Signed)
Message copied by Fredrich Birks on Thu Sep 13, 2011  9:54 AM ------      Message from: Lorin Mercy K      Created: Thu Sep 13, 2011  7:56 AM      Regarding: schedule                   ----- Message -----         From: Dara Lords, PA         Sent: 09/12/2011   3:55 PM           To: Sharee Pimple, CMA            Wee,Jona DMale, 78 y.o., [birth year]            S/p right CEA by Darrick Penna.      Fu with him in 2 weeks.            Thanks,      Lelon Mast

## 2011-09-13 NOTE — Progress Notes (Addendum)
Pt remains pleasantly confused today; neither combative nor agitated, VSS. Applied condom cath because Pt was urinating frequently and was worried about it. Pt would stand to urinate and is a fall risk.Marland Kitchen Appears more relaxed with cath. Will keep bed/chair alarm on and continue to monitor. 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 Disposition home w/family.

## 2011-09-14 ENCOUNTER — Encounter (HOSPITAL_COMMUNITY): Payer: Self-pay | Admitting: Vascular Surgery

## 2011-09-14 LAB — GLUCOSE, CAPILLARY: Glucose-Capillary: 157 mg/dL — ABNORMAL HIGH (ref 70–99)

## 2011-09-14 MED ORDER — ENOXAPARIN SODIUM 30 MG/0.3ML ~~LOC~~ SOLN
30.0000 mg | SUBCUTANEOUS | Status: DC
  Administered 2011-09-14 – 2011-09-16 (×3): 30 mg via SUBCUTANEOUS
  Filled 2011-09-14 (×4): qty 0.3

## 2011-09-14 MED ORDER — OXYCODONE HCL 5 MG PO TABS: 5.0000 mg | ORAL_TABLET | Freq: Four times a day (QID) | ORAL | Status: DC | PRN

## 2011-09-14 NOTE — Progress Notes (Addendum)
[facility] Progress Note  09/14/2011 7:24 AM POD 2  Subjective:  Oriented to place and date.  Was confused overnight requiring a sitter.  He is more hoarse this am.  Denies any trouble swallowing.  Afebrile x 24 hrs  VSS  98%RA Filed Vitals:   09/14/11 0715  BP: 140/80  Pulse: 78  Temp: 97.9 F (36.6 C)  Resp: 14     Physical Exam: Neuro:  In tact Incision:  C/d/i. However, there is some increased erythema around the incision and it is slightly more swollen this am.  CBC    Component Value Date/Time   WBC 10.5 09/13/2011 0355   RBC 3.75* 09/13/2011 0355   HGB 11.5* 09/13/2011 0355   HCT 33.2* 09/13/2011 0355   PLT 125* 09/13/2011 0355   MCV 88.5 09/13/2011 0355   MCH 30.7 09/13/2011 0355   MCHC 34.6 09/13/2011 0355   RDW 13.8 09/13/2011 0355    BMET    Component Value Date/Time   NA 139 09/13/2011 0355   K 3.8 09/13/2011 0355   CL 107 09/13/2011 0355   CO2 25 09/13/2011 0355   GLUCOSE 160* 09/13/2011 0355   BUN 26* 09/13/2011 0355   CREATININE 1.22 09/13/2011 0355   CALCIUM 8.1* 09/13/2011 0355   GFRNONAA 52* 09/13/2011 0355   GFRAA 60* 09/13/2011 0355     Intake/Output Summary (Last 24 hours) at 09/14/11 0724 Last data filed at 09/14/11 1610  Gross per 24 hour  Intake    825 ml  Output   1150 ml  Net   -325 ml      Assessment/Plan:  This is a 78 y.o. male who is s/p right CEA POD 2  -incision has some increased redness around incision.  There is some redness at the distal portion of the incision that was present yesterday from the tape and this is unchanged.  Slightly more swollen -still with some confusion overnight most likely due to his age and post anesthesia as well as in step down unit.    Doreatha Massed, PA-C [facility] [phone]   Small hematoma right neck, no airway compromise or pt complaints of pain Neuro intact probably near baseline mental status If no change in neck this afternoon will d/c home  Fabienne Bruns, MD [facility] [place] Office: [phone] Pager: [phone]

## 2011-09-14 NOTE — Progress Notes (Signed)
Clinical Social Work  CSW met with patient and family at bedside to give offers. CSW provided family with several offers. Family reports they will tour facilities have decision by 09/15/11. Patient needs to be sitter free for 24 hours before dc. CSW informed RN of need to [employer] and wrote sticky note for MD. Please call with any questions.  [place], [place] [phone]

## 2011-09-14 NOTE — Plan of Care (Signed)
Problem: Phase I Progression Outcomes Goal: Vascular site scale level 0 - I Vascular Site Scale Level 0: No bruising/bleeding/hematoma Level I (Mild): Bruising/Ecchymosis, minimal bleeding/ooozing, palpable hematoma < 3 cm Level II (Moderate): Bleeding not affecting hemodynamic parameters, pseudoaneurysm, palpable hematoma > 3 cm Outcome: Completed/Met Date Met:  09/14/11 Level 1

## 2011-09-14 NOTE — Progress Notes (Signed)
Pt transferred from 3300; sitter and family at pts bedside; pt pleasantly confused at this time; will cont. To monitor.

## 2011-09-14 NOTE — Progress Notes (Signed)
Patient has been increasingly confused; pulling at condom cath and trying to get out of bed; incision puffy.  Edilia Bo on call; was notified of puffy incision and sitter requested.  Received order. Safety mitts also applied.  Will continue to monitor.   Vivi Martens RN

## 2011-09-14 NOTE — Clinical Social Work Placement (Addendum)
     Clinical Social Work Department CLINICAL SOCIAL WORK PLACEMENT NOTE 09/17/2011  Patient:  Ronald Hunt, Ronald Hunt  Account Number:  [id number] Admit date:  09/12/2011  Clinical Social Worker:  Unk Lightning, LCSW  Date/time:  09/14/2011 08:30 AM  Clinical Social Work is seeking post-discharge placement for this patient at the following level of care:   SKILLED NURSING   (*CSW will update this form in Epic as items are completed)   09/14/2011  Patient/family provided with [facility] Department of Clinical Social Works list of facilities offering this level of care within the geographic area requested by the patient (or if unable, by the patients family).  09/14/2011  Patient/family informed of their freedom to choose among providers that offer the needed level of care, that participate in Medicare, Medicaid or managed care program needed by the patient, have an available bed and are willing to accept the patient.  09/14/2011  Patient/family informed of [facility] ownership interest in [facility], as well as of the fact that they are under no obligation to receive care at this facility.  PASARR submitted to EDS on 09/14/2011 PASARR number received from EDS on 09/17/2011  FL2 transmitted to all facilities in geographic area requested by pt/family on  09/14/2011 FL2 transmitted to all facilities within larger geographic area on 09/17/2011  Patient informed that his/her managed care company has contracts with or will negotiate with  certain facilities, including the following:     Patient/family informed of bed offers received:  09/17/2011 Patient chooses bed at [facility] Physician recommends and patient chooses bed at    Patient to be transferred to [facility] on  09/17/2011 Patient to be transferred to facility by ems  The following physician request were entered in Epic:   Additional Comments:

## 2011-09-14 NOTE — Clinical Social Work Psychosocial (Signed)
     Clinical Social Work Department BRIEF PSYCHOSOCIAL ASSESSMENT 09/14/2011  Patient:  Ronald Hunt, Ronald Hunt     Account Number:  [id number]     Admit date:  09/12/2011  Clinical Social Worker:  Dennison Bulla  Date/Time:  09/14/2011 08:45 AM  Referred by:  Physician  Date Referred:  09/14/2011 Referred for  SNF Placement   Other Referral:   Interview type:  Family Other interview type:   Ronald Hunt (Son) and Darl Pikes (Dtr-in-law)    PSYCHOSOCIAL DATA Living Status:  ALONE Admitted from facility:   Level of care:   Primary support name:  Ronald Hunt Primary support relationship to patient:  CHILD, ADULT Degree of support available:   Strong    CURRENT CONCERNS Current Concerns  Post-Acute Placement   Other Concerns:    SOCIAL WORK ASSESSMENT / PLAN CSW received referral to assist with dc planning. CSW left a message for son on 09/13/11. Son called and reported CSW could speak with patient's dtr-in-law Darl Pikes). CSW received a call from dtr-in-law.    CSW introduced myself and explained role. CSW provided time for family to discuss their concerns and desires at dc. Family reported that patient lives alone and that son checks on patient every other day. Son lives 25 miles away but shops for patient and ensures that he is "taking care of himself". Family reports they are concerned about patient returning home at dc. Per family, patient exercises at home and does "great in his own environment". Family reports that they would feel more comfortable with [facility]. CSW explained process and family agreeable to fax out to [facility] [place].    CSW submitted pasarr, completed FL2 and faxed out. CSW will follow up with bed offers.   Assessment/plan status:  Psychosocial Support/Ongoing Assessment of Needs Other assessment/ plan:   Information/referral to community resources:   SNF list    PATIENTS/FAMILYS RESPONSE TO PLAN OF CARE: Patient remains confused and has a Comptroller.  CSW spoke with family who is agreeable to [facility]. Family was appreciative of CSW call and engaged throughout assessment.

## 2011-09-15 LAB — GLUCOSE, CAPILLARY
Glucose-Capillary: 136 mg/dL — ABNORMAL HIGH (ref 70–99)
Glucose-Capillary: 164 mg/dL — ABNORMAL HIGH (ref 70–99)
Glucose-Capillary: 139 mg/dL — ABNORMAL HIGH (ref 70–99)
Glucose-Capillary: 118 mg/dL — ABNORMAL HIGH (ref 70–99)

## 2011-09-15 LAB — URINALYSIS, ROUTINE W REFLEX MICROSCOPIC
Bilirubin Urine: NEGATIVE
Nitrite: NEGATIVE
Protein, ur: NEGATIVE mg/dL
Urobilinogen, UA: 0.2 mg/dL (ref 0.0–1.0)

## 2011-09-15 LAB — URINE MICROSCOPIC-ADD ON

## 2011-09-15 NOTE — Progress Notes (Signed)
Clinical Social Work  CSW spoke with family who reported they chose [facility] if patient was going to dc over the weekend. Per RN, sitter was dc at 1am on 09/15/11 so patient would not be able to dc until 09/16/11. If SNF is unable to accept on Sunday then family prefers [facility] on Monday (09/17/11). CSW will continue to follow.  [place], [place] [phone]

## 2011-09-15 NOTE — Progress Notes (Addendum)
VASCULAR AND VEIN SURGERY POST - OP CEA PROGRESS NOTE  Date of Surgery: 09/12/2011 Surgeon: Surgeon(s): Sherren Kerns, MD 3 Days Post-Op right Carotid Endarterectomy .  HPI: Ronald Hunt is a 78 y.o. male who is 3 Days Post-Op right Carotid Endarterectomy . Patient is doing well. Pre-operative symptoms are Improved Patient denies headache; Patient denies difficulty swallowing; denies weakness in upper or lower extremities; Pt. denies other symptoms of stroke or TIA.  IMAGING: No results found.  Significant Diagnostic Studies: CBC Lab Results  Component Value Date   WBC 10.5 09/13/2011   HGB 11.5* 09/13/2011   HCT 33.2* 09/13/2011   MCV 88.5 09/13/2011   PLT 125* 09/13/2011    BMET    Component Value Date/Time   NA 139 09/13/2011 0355   K 3.8 09/13/2011 0355   CL 107 09/13/2011 0355   CO2 25 09/13/2011 0355   GLUCOSE 160* 09/13/2011 0355   BUN 26* 09/13/2011 0355   CREATININE 1.22 09/13/2011 0355   CALCIUM 8.1* 09/13/2011 0355   GFRNONAA 52* 09/13/2011 0355   GFRAA 60* 09/13/2011 0355    COAG Lab Results  Component Value Date   INR 1.06 09/04/2011   No results found for this basename: PTT      Intake/Output Summary (Last 24 hours) at 09/15/11 0839 Last data filed at 09/15/11 0600  Gross per 24 hour  Intake    620 ml  Output   1375 ml  Net   -755 ml    Physical Exam:  BP Readings from Last 3 Encounters:  09/15/11 157/75  09/15/11 157/75  09/04/11 131/71   Temp Readings from Last 3 Encounters:  09/15/11 97 F (36.1 C) Oral  09/15/11 97 F (36.1 C) Oral  09/04/11 99.2 F (37.3 C)    SpO2 Readings from Last 3 Encounters:  09/15/11 94%  09/15/11 94%  09/04/11 98%   Pulse Readings from Last 3 Encounters:  09/15/11 73  09/15/11 73  09/04/11 68    Pt is alert and follows commands well  Speech is fluent right Neck Wound is clean, dry, intact, erythematous or healing well tape dermatisis reaction. Patient with Negative tongue deviation and Negative  facial droop Pt has good and equal strength in all extremities  Assessment: Ronald Hunt is a 78 y.o. male is S/P Right Carotid endarterectomy Pt is voiding, ambulating and taking po well   Plan: Discharge to: Skilled nursing facility Follow-up in 4 weeks   Ronald Hunt [facility] [phone] 09/15/2011 8:39 AM   agree with above Bowel movement today Incision unchanged.  Mild hematoma  Ronald Hunt

## 2011-09-16 LAB — GLUCOSE, CAPILLARY
Glucose-Capillary: 141 mg/dL — ABNORMAL HIGH (ref 70–99)
Glucose-Capillary: 269 mg/dL — ABNORMAL HIGH (ref 70–99)

## 2011-09-16 NOTE — Progress Notes (Addendum)
VASCULAR AND VEIN SURGERY POST - OP CEA PROGRESS NOTE  Date of Surgery: 09/12/2011 Surgeon: Surgeon(s): Sherren Kerns, MD 4 Days Post-Op right Carotid Endarterectomy .  HPI: Ronald Hunt is a 78 y.o. male who is 4 Days Post-Op right Carotid Endarterectomy . Patient is doing well. Pre-operative symptoms are Improved Patient denies headache; Patient denies difficulty swallowing; denies weakness in upper or lower extremities; Pt. denies other symptoms of stroke or TIA.  IMAGING: No results found.  Significant Diagnostic Studies: CBC Lab Results  Component Value Date   WBC 10.5 09/13/2011   HGB 11.5* 09/13/2011   HCT 33.2* 09/13/2011   MCV 88.5 09/13/2011   PLT 125* 09/13/2011    BMET    Component Value Date/Time   NA 139 09/13/2011 0355   K 3.8 09/13/2011 0355   CL 107 09/13/2011 0355   CO2 25 09/13/2011 0355   GLUCOSE 160* 09/13/2011 0355   BUN 26* 09/13/2011 0355   CREATININE 1.22 09/13/2011 0355   CALCIUM 8.1* 09/13/2011 0355   GFRNONAA 52* 09/13/2011 0355   GFRAA 60* 09/13/2011 0355    COAG Lab Results  Component Value Date   INR 1.06 09/04/2011   No results found for this basename: PTT      Intake/Output Summary (Last 24 hours) at 09/16/11 0838 Last data filed at 09/16/11 0300  Gross per 24 hour  Intake    480 ml  Output   1500 ml  Net  -1020 ml    Physical Exam:  BP Readings from Last 3 Encounters:  09/16/11 150/78  09/16/11 150/78  09/04/11 131/71   Temp Readings from Last 3 Encounters:  09/16/11 98.4 F (36.9 C) Oral  09/16/11 98.4 F (36.9 C) Oral  09/04/11 99.2 F (37.3 C)    SpO2 Readings from Last 3 Encounters:  09/16/11 95%  09/16/11 95%  09/04/11 98%   Pulse Readings from Last 3 Encounters:  09/16/11 70  09/16/11 70  09/04/11 68    Pt is Alert and follows commands Speech is fluent right Neck Wound is healing well Patient with Negative tongue deviation and Negative facial droop Pt has good and equal strength in all  extremities  Assessment: Ronald Hunt is a 78 y.o. male is S/P Right Carotid endarterectomy Pt is voiding, ambulating and taking po well   Plan: Discharge to: Skilled nursing facility 09-17-2011 Follow-up in 4 weeks   Clinton Gallant [facility] [phone] 09/16/2011 8:38 AM   I agree with the above. The patient was seen and examined. The right neck incision is healing nicely. The hematoma has nearly resolved. He is neurologically intact. Plan is for discharge to skilled nursing facility, hopefully tomorrow    Durene Cal

## 2011-09-17 NOTE — Progress Notes (Signed)
[facility] [place]  Subjective  - No complaints  Objective 161/79 75 97.9 F (36.6 C) (Oral) 18 95%  Intake/Output Summary (Last 24 hours) at 09/17/11 9562 Last data filed at 09/17/11 0500  Gross per 24 hour  Intake    600 ml  Output    601 ml  Net     -1 ml   Small hematoma right neck, improved over last 72 hours Neuro-UE LE motor intact  Assessment/Planning: Post CEA doing well Awaiting SNF d/c when bed available  Giovanie Lefebre E 09/17/2011 8:07 AM --  Laboratory Lab Results: No results found for this basename: WBC:2,HGB:2,HCT:2,PLT:2 in the last 72 hours BMET No results found for this basename: NA:2,K:2,CL:2,CO2:2,GLUCOSE:2,BUN:2,CREATININE:2,CALCIUM:2 in the last 72 hours  COAG Lab Results  Component Value Date   INR 1.06 09/04/2011   No results found for this basename: PTT    Antibiotics Anti-infectives     Start     Dose/Rate Route Frequency Ordered Stop   09/13/11 0100   cefUROXime (ZINACEF) 1.5 g in dextrose 5 % 50 mL IVPB        1.5 g 100 mL/hr over 30 Minutes Intravenous Every 12 hours 09/12/11 1800 09/13/11 1311   09/11/11 1409   cefUROXime (ZINACEF) 1.5 g in dextrose 5 % 50 mL IVPB        1.5 g 100 mL/hr over 30 Minutes Intravenous 30 min pre-op 09/11/11 1409 09/12/11 1325

## 2011-09-17 NOTE — Progress Notes (Signed)
VASCULAR AND VEIN SURGERY POST - OP CEA PROGRESS NOTE  Date of Surgery: 09/12/2011 Surgeon: Surgeon(s): Sherren Kerns, MD 5 Days Post-Op right Carotid Endarterectomy .  HPI: Ronald Hunt is a 78 y.o. male who is 5 Days Post-Op right Carotid Endarterectomy . Patient is doing well. Pre-operative symptoms are Improved Patient denies headache; Patient denies difficulty swallowing; denies weakness in upper or lower extremities; Pt. denies other symptoms of stroke or TIA.  IMAGING: No results found.  Significant Diagnostic Studies: CBC Lab Results  Component Value Date   WBC 10.5 09/13/2011   HGB 11.5* 09/13/2011   HCT 33.2* 09/13/2011   MCV 88.5 09/13/2011   PLT 125* 09/13/2011    BMET    Component Value Date/Time   NA 139 09/13/2011 0355   K 3.8 09/13/2011 0355   CL 107 09/13/2011 0355   CO2 25 09/13/2011 0355   GLUCOSE 160* 09/13/2011 0355   BUN 26* 09/13/2011 0355   CREATININE 1.22 09/13/2011 0355   CALCIUM 8.1* 09/13/2011 0355   GFRNONAA 52* 09/13/2011 0355   GFRAA 60* 09/13/2011 0355    COAG Lab Results  Component Value Date   INR 1.06 09/04/2011   No results found for this basename: PTT      Intake/Output Summary (Last 24 hours) at 09/17/11 0757 Last data filed at 09/17/11 0500  Gross per 24 hour  Intake    960 ml  Output    601 ml  Net    359 ml    Physical Exam:  BP Readings from Last 3 Encounters:  09/17/11 161/79  09/17/11 161/79  09/04/11 131/71   Temp Readings from Last 3 Encounters:  09/17/11 97.9 F (36.6 C) Oral  09/17/11 97.9 F (36.6 C) Oral  09/04/11 99.2 F (37.3 C)    SpO2 Readings from Last 3 Encounters:  09/17/11 95%  09/17/11 95%  09/04/11 98%   Pulse Readings from Last 3 Encounters:  09/17/11 75  09/17/11 75  09/04/11 68    Pt is Alert and follows commands well Speech is fluent right Neck Wound is healing well Patient with Negative tongue deviation and Negative facial droop Pt has good and equal strength in all  extremities  Assessment: Ronald Hunt is a 78 y.o. male is S/P Right Carotid endarterectomy Pt is voiding, ambulating and taking po well   Plan: Discharge to: Skilled nursing facility Follow-up in 4 weeks   Clinton Gallant [facility] [phone] 09/17/2011 7:57 AM

## 2011-10-03 ENCOUNTER — Encounter: Payer: Self-pay | Admitting: Vascular Surgery

## 2011-10-04 ENCOUNTER — Ambulatory Visit (INDEPENDENT_AMBULATORY_CARE_PROVIDER_SITE_OTHER): Payer: Medicare Other | Admitting: Vascular Surgery

## 2011-10-04 ENCOUNTER — Encounter: Payer: Self-pay | Admitting: Vascular Surgery

## 2011-10-04 VITALS — BP 146/68 | HR 72 | Temp 98.1°F | Ht 73.0 in | Wt 194.0 lb

## 2011-10-04 DIAGNOSIS — M79609 Pain in unspecified limb: Secondary | ICD-10-CM

## 2011-10-04 DIAGNOSIS — M7989 Other specified soft tissue disorders: Secondary | ICD-10-CM

## 2011-10-04 DIAGNOSIS — I6529 Occlusion and stenosis of unspecified carotid artery: Secondary | ICD-10-CM

## 2011-10-04 DIAGNOSIS — Z48812 Encounter for surgical aftercare following surgery on the circulatory system: Secondary | ICD-10-CM

## 2011-10-04 NOTE — Progress Notes (Signed)
[facility] [place] HISTORY AND PHYSICAL    History of Present Illness:  Patient is a 78 y.o. year old male who presents for post-operative follow-up after right carotid endarterectomy.  Denies headaches, numbness, tingling or other neuro deficits.  No swallowing problems.  No incisional drainage.  He had some confusion postoperatively and this is now resolved. He has noticed some increased swelling in his right lower extremity over the last few days. He did have some swelling in the left leg but the right is worse than the left. He denies any pain in the legs.   Physical Examination  Filed Vitals:   10/04/11 0926  BP: 146/68  Pulse: 72  Temp:     Body mass index is 25.60 kg/(m^2).  General:  Alert and oriented, no acute distress Neck: No bruit or JVD, right neck incision healing Skin: No rash Extremities:  2+ femoral pulses with absent popliteal and pedal pulses, right leg swollen from the knee down with some pitting and a bluish discoloration of the foot, mild swelling left lower extremity much less than the right Neurologic: Upper and lower extremity motor 5/5 and symmetric  DATA: The patient had a venous duplex exam of his right lower extremity today to rule out DVT. I reviewed and interpreted this study. This was negative for DVT there was a component of reflux in his greater saphenous in his deep venous system   ASSESSMENT:   #1 status post right carotid endarterectomy with normal neurologic function at this point although he does have some component of dementia #2 right leg swelling most likely secondary to some mild venous reflux   PLAN:  #1 followup carotid duplex exam in 6 months  #2 lower extremity compression stockings if swelling continues to be a problem   Fabienne Bruns, MD [facility] [place] Office: [phone] Pager: [phone]

## 2011-10-04 NOTE — Addendum Note (Signed)
Addended by: Sharee Pimple on: 10/04/2011 01:14 PM   Modules accepted: Orders

## 2011-10-12 NOTE — Procedures (Unsigned)
DUPLEX DEEP VENOUS EXAM - LOWER EXTREMITY  INDICATION:  Right lower extremity edema.  HISTORY:  Edema:  Yes. Trauma/Surgery:  Recent carotid endarterectomy, otherwise no lower extremity intervention. Pain:  Yes. PE:  No. Previous DVT:  No. Anticoagulants:  Unknown. Other:  DUPLEX EXAM:               CFV   SFV   PopV  PTV    GSV               R  L  R  L  R  L  R   L  R  L Thrombosis    o     o     o     o      o Spontaneous   +     +     +     +      + Phasic        +     +     +     +      + Augmentation  +     +     +     +      + Compressible  +     +     +     +      + Competent     +     +     0     +      0  Legend:  + - yes  o - no  p - partial  D - decreased  IMPRESSION: 1. No evidence of deep or superficial venous thrombus identified     involving the right lower extremity. 2. Deep venous reflux present involving the right popliteal vein of     >500 milliseconds. 3. Superficial venous reflux involving the right great saphenous vein     of >500 milliseconds present.   _____________________________ Janetta Hora. Fields, MD  SH/MEDQ  D:  10/04/2011  T:  10/04/2011  Job:  [id number]

## 2011-10-20 ENCOUNTER — Encounter (HOSPITAL_COMMUNITY): Payer: Self-pay | Admitting: *Deleted

## 2011-10-20 ENCOUNTER — Inpatient Hospital Stay (HOSPITAL_COMMUNITY)
Admission: EM | Admit: 2011-10-20 | Discharge: 2011-10-22 | DRG: 683 | Disposition: A | Discharge status: Home Health | Payer: Medicare Other | Attending: Internal Medicine | Admitting: Internal Medicine

## 2011-10-20 ENCOUNTER — Inpatient Hospital Stay (HOSPITAL_COMMUNITY): Payer: Medicare Other

## 2011-10-20 ENCOUNTER — Emergency Department (HOSPITAL_COMMUNITY): Payer: Medicare Other

## 2011-10-20 DIAGNOSIS — R4781 Slurred speech: Secondary | ICD-10-CM | POA: Diagnosis present

## 2011-10-20 DIAGNOSIS — C61 Malignant neoplasm of prostate: Secondary | ICD-10-CM | POA: Diagnosis present

## 2011-10-20 DIAGNOSIS — R4182 Altered mental status, unspecified: Secondary | ICD-10-CM

## 2011-10-20 DIAGNOSIS — I10 Essential (primary) hypertension: Secondary | ICD-10-CM | POA: Diagnosis present

## 2011-10-20 DIAGNOSIS — E119 Type 2 diabetes mellitus without complications: Secondary | ICD-10-CM | POA: Diagnosis present

## 2011-10-20 DIAGNOSIS — I6529 Occlusion and stenosis of unspecified carotid artery: Secondary | ICD-10-CM | POA: Diagnosis present

## 2011-10-20 DIAGNOSIS — Z79899 Other long term (current) drug therapy: Secondary | ICD-10-CM

## 2011-10-20 DIAGNOSIS — D72829 Elevated white blood cell count, unspecified: Secondary | ICD-10-CM | POA: Diagnosis present

## 2011-10-20 DIAGNOSIS — N12 Tubulo-interstitial nephritis, not specified as acute or chronic: Secondary | ICD-10-CM

## 2011-10-20 DIAGNOSIS — H3530 Unspecified macular degeneration: Secondary | ICD-10-CM | POA: Diagnosis present

## 2011-10-20 DIAGNOSIS — N289 Disorder of kidney and ureter, unspecified: Secondary | ICD-10-CM

## 2011-10-20 DIAGNOSIS — A498 Other bacterial infections of unspecified site: Secondary | ICD-10-CM | POA: Diagnosis present

## 2011-10-20 DIAGNOSIS — H409 Unspecified glaucoma: Secondary | ICD-10-CM | POA: Diagnosis present

## 2011-10-20 DIAGNOSIS — E876 Hypokalemia: Secondary | ICD-10-CM | POA: Diagnosis present

## 2011-10-20 DIAGNOSIS — G459 Transient cerebral ischemic attack, unspecified: Secondary | ICD-10-CM

## 2011-10-20 DIAGNOSIS — I080 Rheumatic disorders of both mitral and aortic valves: Secondary | ICD-10-CM | POA: Diagnosis present

## 2011-10-20 DIAGNOSIS — K219 Gastro-esophageal reflux disease without esophagitis: Secondary | ICD-10-CM | POA: Diagnosis present

## 2011-10-20 DIAGNOSIS — N390 Urinary tract infection, site not specified: Secondary | ICD-10-CM | POA: Diagnosis present

## 2011-10-20 DIAGNOSIS — N179 Acute kidney failure, unspecified: Secondary | ICD-10-CM | POA: Diagnosis present

## 2011-10-20 DIAGNOSIS — Z7982 Long term (current) use of aspirin: Secondary | ICD-10-CM

## 2011-10-20 DIAGNOSIS — Z8673 Personal history of transient ischemic attack (TIA), and cerebral infarction without residual deficits: Secondary | ICD-10-CM

## 2011-10-20 DIAGNOSIS — E860 Dehydration: Secondary | ICD-10-CM | POA: Diagnosis present

## 2011-10-20 DIAGNOSIS — I639 Cerebral infarction, unspecified: Secondary | ICD-10-CM | POA: Diagnosis present

## 2011-10-20 DIAGNOSIS — R413 Other amnesia: Secondary | ICD-10-CM | POA: Diagnosis present

## 2011-10-20 LAB — CBC
HCT: 32.6 % — ABNORMAL LOW (ref 39.0–52.0)
Hemoglobin: 10.9 g/dL — ABNORMAL LOW (ref 13.0–17.0)
MCV: 89.1 fL (ref 78.0–100.0)
Platelets: 131 10*3/uL — ABNORMAL LOW (ref 150–400)
RBC: 3.66 MIL/uL — ABNORMAL LOW (ref 4.22–5.81)
WBC: 24.7 10*3/uL — ABNORMAL HIGH (ref 4.0–10.5)

## 2011-10-20 LAB — POCT I-STAT, CHEM 8
BUN: 44 mg/dL — ABNORMAL HIGH (ref 6–23)
Chloride: 102 mEq/L (ref 96–112)
HCT: 32 % — ABNORMAL LOW (ref 39.0–52.0)
Hemoglobin: 10.9 g/dL — ABNORMAL LOW (ref 13.0–17.0)
TCO2: 23 mmol/L (ref 0–100)

## 2011-10-20 LAB — GLUCOSE, CAPILLARY
Glucose-Capillary: 125 mg/dL — ABNORMAL HIGH (ref 70–99)
Glucose-Capillary: 130 mg/dL — ABNORMAL HIGH (ref 70–99)

## 2011-10-20 LAB — CK TOTAL AND CKMB (NOT AT ARMC): Relative Index: 0.9 (ref 0.0–2.5)

## 2011-10-20 LAB — URINALYSIS, ROUTINE W REFLEX MICROSCOPIC
Nitrite: POSITIVE — AB
Protein, ur: 30 mg/dL — AB
Specific Gravity, Urine: 1.014 (ref 1.005–1.030)
Urobilinogen, UA: 0.2 mg/dL (ref 0.0–1.0)

## 2011-10-20 LAB — COMPREHENSIVE METABOLIC PANEL
ALT: 10 U/L (ref 0–53)
CO2: 26 mEq/L (ref 19–32)
Calcium: 8.6 mg/dL (ref 8.4–10.5)
GFR calc Af Amer: 34 mL/min — ABNORMAL LOW (ref >90)
GFR calc non Af Amer: 29 mL/min — ABNORMAL LOW (ref >90)
Glucose, Bld: 164 mg/dL — ABNORMAL HIGH (ref 70–99)
Sodium: 136 mEq/L (ref 135–145)

## 2011-10-20 LAB — DIFFERENTIAL
Eosinophils Relative: 0 % (ref 0–5)
Lymphocytes Relative: 2 % — ABNORMAL LOW (ref 12–46)
Lymphs Abs: 0.4 10*3/uL — ABNORMAL LOW (ref 0.7–4.0)
Monocytes Absolute: 1.3 10*3/uL — ABNORMAL HIGH (ref 0.1–1.0)

## 2011-10-20 LAB — URINE MICROSCOPIC-ADD ON

## 2011-10-20 MED ORDER — ONDANSETRON HCL 4 MG/2ML IJ SOLN: 4.0000 mg | Freq: Four times a day (QID) | INTRAMUSCULAR | Status: DC | PRN

## 2011-10-20 MED ORDER — TAMSULOSIN HCL 0.4 MG PO CAPS
0.4000 mg | ORAL_CAPSULE | Freq: Every day | ORAL | Status: DC
  Administered 2011-10-21 – 2011-10-22 (×2): 0.4 mg via ORAL
  Filled 2011-10-20 (×2): qty 1

## 2011-10-20 MED ORDER — ONDANSETRON HCL 4 MG/2ML IJ SOLN: 4.0000 mg | Freq: Three times a day (TID) | INTRAMUSCULAR | Status: AC | PRN

## 2011-10-20 MED ORDER — DEXTROSE 5 % IV SOLN
1.0000 g | Freq: Once | INTRAVENOUS | Status: AC
  Administered 2011-10-20: 1 g via INTRAVENOUS
  Filled 2011-10-20: qty 10

## 2011-10-20 MED ORDER — DOCUSATE SODIUM 100 MG PO CAPS
100.0000 mg | ORAL_CAPSULE | Freq: Two times a day (BID) | ORAL | Status: DC
  Administered 2011-10-21 (×2): 100 mg via ORAL
  Filled 2011-10-20 (×5): qty 1

## 2011-10-20 MED ORDER — POTASSIUM CHLORIDE CRYS ER 20 MEQ PO TBCR
40.0000 meq | EXTENDED_RELEASE_TABLET | Freq: Three times a day (TID) | ORAL | Status: AC
  Administered 2011-10-20 (×2): 40 meq via ORAL
  Filled 2011-10-20 (×2): qty 2

## 2011-10-20 MED ORDER — POTASSIUM CHLORIDE 10 MEQ/100ML IV SOLN
10.0000 meq | Freq: Once | INTRAVENOUS | Status: AC
Start: 2011-10-20 — End: 2011-10-20
  Administered 2011-10-20: 10 meq via INTRAVENOUS
  Filled 2011-10-20: qty 100

## 2011-10-20 MED ORDER — ACETAMINOPHEN 325 MG PO TABS: 325.0000 mg | ORAL_TABLET | Freq: Four times a day (QID) | ORAL | Status: DC | PRN

## 2011-10-20 MED ORDER — ASPIRIN 81 MG PO CHEW
324.0000 mg | CHEWABLE_TABLET | Freq: Once | ORAL | Status: AC
  Administered 2011-10-20: 324 mg via ORAL
  Filled 2011-10-20: qty 4

## 2011-10-20 MED ORDER — SODIUM CHLORIDE 0.9 % IV SOLN: INTRAVENOUS | Status: DC

## 2011-10-20 MED ORDER — SODIUM CHLORIDE 0.9 % IJ SOLN
3.0000 mL | Freq: Two times a day (BID) | INTRAMUSCULAR | Status: DC
  Administered 2011-10-21: 3 mL via INTRAVENOUS

## 2011-10-20 MED ORDER — BIMATOPROST 0.01 % OP SOLN
1.0000 [drp] | Freq: Every day | OPHTHALMIC | Status: DC
  Administered 2011-10-20 – 2011-10-21 (×2): 1 [drp] via OPHTHALMIC
  Filled 2011-10-20: qty 2.5

## 2011-10-20 MED ORDER — PANTOPRAZOLE SODIUM 40 MG PO TBEC
40.0000 mg | DELAYED_RELEASE_TABLET | Freq: Every day | ORAL | Status: DC
  Administered 2011-10-21 – 2011-10-22 (×2): 40 mg via ORAL
  Filled 2011-10-20: qty 1

## 2011-10-20 MED ORDER — INSULIN ASPART 100 UNIT/ML ~~LOC~~ SOLN
0.0000 [IU] | Freq: Three times a day (TID) | SUBCUTANEOUS | Status: DC
Start: 2011-10-20 — End: 2011-10-22
  Administered 2011-10-20: 1 [IU] via SUBCUTANEOUS
  Administered 2011-10-21: 2 [IU] via SUBCUTANEOUS
  Administered 2011-10-21 – 2011-10-22 (×2): 1 [IU] via SUBCUTANEOUS
  Administered 2011-10-22: 2 [IU] via SUBCUTANEOUS

## 2011-10-20 MED ORDER — HEPARIN SODIUM (PORCINE) 5000 UNIT/ML IJ SOLN
5000.0000 [IU] | Freq: Three times a day (TID) | INTRAMUSCULAR | Status: DC
  Administered 2011-10-20 – 2011-10-22 (×6): 5000 [IU] via SUBCUTANEOUS
  Filled 2011-10-20 (×8): qty 1

## 2011-10-20 MED ORDER — DEXTROSE 5 % IV SOLN
1.0000 g | INTRAVENOUS | Status: DC
  Administered 2011-10-21 – 2011-10-22 (×2): 1 g via INTRAVENOUS
  Filled 2011-10-20 (×2): qty 10

## 2011-10-20 MED ORDER — TIMOLOL MALEATE 0.5 % OP SOLN
1.0000 [drp] | Freq: Two times a day (BID) | OPHTHALMIC | Status: DC
  Administered 2011-10-20 – 2011-10-22 (×4): 1 [drp] via OPHTHALMIC
  Filled 2011-10-20: qty 5

## 2011-10-20 MED ORDER — POLYETHYLENE GLYCOL 3350 17 G PO PACK
17.0000 g | PACK | Freq: Every day | ORAL | Status: DC
  Administered 2011-10-21: 17 g via ORAL
  Filled 2011-10-20 (×3): qty 1

## 2011-10-20 MED ORDER — SODIUM CHLORIDE 0.9 % IV SOLN
INTRAVENOUS | Status: DC
  Administered 2011-10-20 – 2011-10-22 (×3): via INTRAVENOUS

## 2011-10-20 MED ORDER — TIMOLOL HEMIHYDRATE 0.5 % OP SOLN: 1.0000 [drp] | Freq: Two times a day (BID) | OPHTHALMIC | Status: DC

## 2011-10-20 MED ORDER — ACETAMINOPHEN 650 MG RE SUPP: 650.0000 mg | Freq: Four times a day (QID) | RECTAL | Status: DC | PRN

## 2011-10-20 MED ORDER — ALUM & MAG HYDROXIDE-SIMETH 200-200-20 MG/5ML PO SUSP: 30.0000 mL | Freq: Four times a day (QID) | ORAL | Status: DC | PRN

## 2011-10-20 MED ORDER — ONDANSETRON HCL 4 MG PO TABS: 4.0000 mg | ORAL_TABLET | Freq: Four times a day (QID) | ORAL | Status: DC | PRN

## 2011-10-20 MED ORDER — SENNOSIDES-DOCUSATE SODIUM 8.6-50 MG PO TABS
1.0000 | ORAL_TABLET | Freq: Every evening | ORAL | Status: DC | PRN
  Filled 2011-10-20: qty 1

## 2011-10-20 NOTE — Progress Notes (Signed)
  Echocardiogram 2D Echocardiogram has been performed.  Ronald Hunt 10/20/2011, 3:58 PM

## 2011-10-20 NOTE — ED Notes (Signed)
Pt returned to room from CT, with son attempting to give urine sample

## 2011-10-20 NOTE — ED Provider Notes (Signed)
History     CSN: [id number]  Arrival date & time 10/20/11  1108   First MD Initiated Contact with Patient 10/20/11 1114      No chief complaint on file.   (Consider location/radiation/quality/duration/timing/severity/associated sxs/prior treatment) HPI  78 year old male with history of TIA presents to ED for possible stroke. Patient lives as a nursing facility. He was last seen normal by his family members 2 days ago. According to his daughter, when she visited him today she felt that patient wasn't ambulating is normal and his speech appears to be slurred. Daughter called EMS to bring patient to the ED. The EMS did not notice any significant changes at initial examination. States patient was mentating, and walking without difficulty and his initial CBG was 220.  Per patient, he has not noticed any changes. He denies fever, headache, trouble swallowing, chest pain, shortness of breath, abdominal pain, back pain. He does endorse some urinary discomfort which has been ongoing for the past several weeks. History was limited due to patient's cognition.  Has R side carotid endarterectomy procedure several weeks ago.    Past Medical History  Diagnosis Date  . Dizziness   . Macular degeneration   . History of TIAs   . Carotid artery occlusion   . Glaucoma   . Stroke     TIA  . Mitral valvular disorder   . Hypertension     takes Amlodipine and Benicar daily  . Heart murmur   . Hoarseness     states pretty much all the time  . Diabetes mellitus     takes Glimepiride daily  . GERD (gastroesophageal reflux disease)     doesn't require meds  . Constipation     occasionally Mag Citrate  . Urinary frequency   . Cancer     prostate;takes Flomax daily  . Nocturia   . Depression     but doesn't require meds  . Anxiety     doesn't take any meds  . Short-term memory loss   . Long-term memory loss   . Aortic insufficiency     mild-moderate AR by echo 07/2011 (Dr. Rennis Golden)    Past  Surgical History  Procedure Date  . Retinal detachment surgery   . Cataract extraction   . Endarterectomy 09/12/2011    Procedure: ENDARTERECTOMY CAROTID;  Surgeon: Sherren Kerns, MD;  Location: [facility] OR;  Service: Vascular;  Laterality: Right;  right carotid artery endarterectomy  with dacron patch angioplasty    Family History  Problem Relation Age of Onset  . Cancer Brother   . Crohn's disease Son     History  Substance Use Topics  . Smoking status: Never Smoker   . Smokeless tobacco: Not on file  . Alcohol Use: Yes     occasionally beer 2-3 times/month      Review of Systems  All other systems reviewed and are negative.    Allergies  Review of patient's allergies indicates no known allergies.  Home Medications   Current Outpatient Rx  Name Route Sig Dispense Refill  . ACETAMINOPHEN 500 MG PO TABS Oral Take 1,000 mg by mouth every 6 (six) hours as needed.    Marland Kitchen AMLODIPINE BESYLATE 10 MG PO TABS Oral Take 10 mg by mouth daily.      . ASPIRIN 81 MG PO TABS Oral Take 81 mg by mouth daily.    Marland Kitchen BETIMOL 0.5 % OP SOLN Both Eyes Place 1 drop into both eyes 2 (two) times daily.     Marland Kitchen  GLIMEPIRIDE 1 MG PO TABS Oral Take 1 mg by mouth daily before breakfast.     . LUMIGAN 0.01 % OP SOLN Both Eyes Place 1 drop into both eyes at bedtime.     Marland Kitchen OLMESARTAN MEDOXOMIL-HCTZ 20-12.5 MG PO TABS Oral Take 1 tablet by mouth daily.      Marland Kitchen TAMSULOSIN HCL 0.4 MG PO CAPS Oral Take 0.4 mg by mouth daily.       Physical Exam  Nursing note and vitals reviewed. Constitutional: He appears well-developed and well-nourished. No distress.       Awake, alert, nontoxic appearance  HENT:  Head: Atraumatic.  Mouth/Throat: Oropharynx is clear and moist.  Eyes: Conjunctivae are normal. Right eye exhibits no discharge. Left eye exhibits no discharge.  Neck: Normal range of motion. Neck supple.  Cardiovascular: Normal rate and regular rhythm.   Pulmonary/Chest: Effort normal. No respiratory distress.  He exhibits no tenderness.  Abdominal: Soft. There is no tenderness. There is no rebound.  Musculoskeletal: He exhibits no tenderness.       ROM appears intact, no obvious focal weakness  Neurological: He is alert. He has normal strength. No cranial nerve deficit or sensory deficit. He displays a negative Romberg sign. GCS eye subscore is 4. GCS verbal subscore is 5. GCS motor subscore is 6.       Poor coordination and gait mildly antalgic. Romberg negative. No obvious focal neuro deficit  Skin: Skin is warm and dry. No rash noted.  Psychiatric: He has a normal mood and affect.    ED Course  Procedures (including critical care time)  Labs Reviewed - No data to display No results found.   No diagnosis found.   Date: 10/20/2011  Rate: 78  Rhythm: normal sinus rhythm  QRS Axis: left  Intervals: normal  ST/T Wave abnormalities: nonspecific ST/T changes  Conduction Disutrbances:left anterior fascicular block, RBBB  Narrative Interpretation:   Old EKG Reviewed: none available  Results for orders placed during the hospital encounter of 10/20/11  PROTIME-INR      Component Value Range   Prothrombin Time 18.9 (*) 11.6 - 15.2 seconds   INR 1.55 (*) 0.00 - 1.49  APTT      Component Value Range   aPTT 36  24 - 37 seconds  CBC      Component Value Range   WBC 24.7 (*) 4.0 - 10.5 K/uL   RBC 3.66 (*) 4.22 - 5.81 MIL/uL   Hemoglobin 10.9 (*) 13.0 - 17.0 g/dL   HCT 01.0 (*) 27.2 - 53.6 %   MCV 89.1  78.0 - 100.0 fL   MCH 29.8  26.0 - 34.0 pg   MCHC 33.4  30.0 - 36.0 g/dL   RDW 64.4  03.4 - 74.2 %   Platelets 131 (*) 150 - 400 K/uL  DIFFERENTIAL      Component Value Range   Neutrophils Relative 93 (*) 43 - 77 %   Neutro Abs 22.9 (*) 1.7 - 7.7 K/uL   Lymphocytes Relative 2 (*) 12 - 46 %   Lymphs Abs 0.4 (*) 0.7 - 4.0 K/uL   Monocytes Relative 5  3 - 12 %   Monocytes Absolute 1.3 (*) 0.1 - 1.0 K/uL   Eosinophils Relative 0  0 - 5 %   Eosinophils Absolute 0.0  0.0 - 0.7 K/uL    Basophils Relative 0  0 - 1 %   Basophils Absolute 0.0  0.0 - 0.1 K/uL  COMPREHENSIVE METABOLIC PANEL  Component Value Range   Sodium 136  135 - 145 mEq/L   Potassium 2.9 (*) 3.5 - 5.1 mEq/L   Chloride 99  96 - 112 mEq/L   CO2 26  19 - 32 mEq/L   Glucose, Bld 164 (*) 70 - 99 mg/dL   BUN 46 (*) 6 - 23 mg/dL   Creatinine, Ser 4.54 (*) 0.50 - 1.35 mg/dL   Calcium 8.6  8.4 - 09.8 mg/dL   Total Protein 6.4  6.0 - 8.3 g/dL   Albumin 3.1 (*) 3.5 - 5.2 g/dL   AST 26  0 - 37 U/L   ALT 10  0 - 53 U/L   Alkaline Phosphatase 72  39 - 117 U/L   Total Bilirubin 0.8  0.3 - 1.2 mg/dL   GFR calc non Af Amer 29 (*) >90 mL/min   GFR calc Af Amer 34 (*) >90 mL/min  CK TOTAL AND CKMB      Component Value Range   Total CK 316 (*) 7 - 232 U/L   CK, MB 2.9  0.3 - 4.0 ng/mL   Relative Index 0.9  0.0 - 2.5  TROPONIN I      Component Value Range   Troponin I <0.30  <0.30 ng/mL  POCT I-STAT, CHEM 8      Component Value Range   Sodium 139  135 - 145 mEq/L   Potassium 3.0 (*) 3.5 - 5.1 mEq/L   Chloride 102  96 - 112 mEq/L   BUN 44 (*) 6 - 23 mg/dL   Creatinine, Ser 1.19 (*) 0.50 - 1.35 mg/dL   Glucose, Bld 147 (*) 70 - 99 mg/dL   Calcium, Ion 8.29 (*) 1.13 - 1.30 mmol/L   TCO2 23  0 - 100 mmol/L   Hemoglobin 10.9 (*) 13.0 - 17.0 g/dL   HCT 56.2 (*) 13.0 - 86.5 %  URINALYSIS, ROUTINE W REFLEX MICROSCOPIC      Component Value Range   Color, Urine YELLOW  YELLOW   APPearance TURBID (*) CLEAR   Specific Gravity, Urine 1.014  1.005 - 1.030   pH 5.5  5.0 - 8.0   Glucose, UA NEGATIVE  NEGATIVE mg/dL   Hgb urine dipstick MODERATE (*) NEGATIVE   Bilirubin Urine NEGATIVE  NEGATIVE   Ketones, ur NEGATIVE  NEGATIVE mg/dL   Protein, ur 30 (*) NEGATIVE mg/dL   Urobilinogen, UA 0.2  0.0 - 1.0 mg/dL   Nitrite POSITIVE (*) NEGATIVE   Leukocytes, UA LARGE (*) NEGATIVE  URINE MICROSCOPIC-ADD ON      Component Value Range   Squamous Epithelial / LPF RARE  RARE   WBC, UA TOO NUMEROUS TO COUNT  <3 WBC/hpf     RBC / HPF 7-10  <3 RBC/hpf   Bacteria, UA MANY (*) RARE   Urine-Other MUCOUS PRESENT     Ct Head Wo Contrast  10/20/2011  *RADIOLOGY REPORT*  Clinical Data: 78 year old male with altered mental status and confusion.  CT HEAD WITHOUT CONTRAST  Technique:  Contiguous axial images were obtained from the base of the skull through the vertex without contrast.  Comparison: 06/18/2004 head CT and 06/07/2009 MRI  Findings: Atrophy and chronic small vessel white matter ischemic changes are again noted.  Focal hypodensity in the left periventricular white matter (images 19 - 20) is new since 2006 and may represent chronic ischemic changes or indeterminate age infarct.  Remote lacunar infarcts within the thalami bilaterally are noted.  No other intracranial abnormalities are identified, including mass  lesion or mass effect, hydrocephalus, extra-axial fluid collection, midline shift, hemorrhage, or acute infarction.  The visualized bony calvarium is unremarkable.  IMPRESSION: Left periventricular white matter hypodensity - question chronic ischemic changes or age indeterminate infarct.  Atrophy, chronic small vessel white matter ischemic changes and remote thalamic infarcts.  Original Report Authenticated By: Rosendo Gros, M.D.    1. AMS 2. Pyelonephritis 3. Acute renal insufficiency 4. Hypokalemia 5. leukocytosis   MDM  TIA vs. Stroke. However, pt appears to be in no obvious distress.  Gait mildly antalgic without focal neuro deficits.  Work up initiated.   Head CT shows Questionable chronic ischemic changes or age indeterminate infarct to L periventricular region.  I discussed with my attending, who recommend inpatient to further evaluate this finding.  No brain MRI ordered at this time.    2:09 PM Pt has evidence of pyelonephritis with altered mental status.  He is currently more alert and oriented.  I have consulted with [facility], who will admit this patient.  Pt to Team 9.  ASA and CXR  ordered per Hospitalist request.        Fayrene Helper, PA-C 10/20/11 1444

## 2011-10-20 NOTE — H&P (Signed)
[facility]                                                                                    Patient Demographics  Ronald Hunt, is a 78 y.o. male  CSN: [id number]  MRN: [id number]  DOB - 21-Aug-1923  Admit Date - 10/20/2011  Outpatient Primary MD for the patient is Terald Sleeper, MD   With History of -  Past Medical History  Diagnosis Date  . Dizziness   . Macular degeneration   . History of TIAs   . Carotid artery occlusion   . Glaucoma   . Stroke     TIA  . Mitral valvular disorder   . Hypertension     takes Amlodipine and Benicar daily  . Heart murmur   . Hoarseness     states pretty much all the time  . Diabetes mellitus     takes Glimepiride daily  . GERD (gastroesophageal reflux disease)     doesn't require meds  . Constipation     occasionally Mag Citrate  . Urinary frequency   . Cancer     prostate;takes Flomax daily  . Nocturia   . Depression     but doesn't require meds  . Anxiety     doesn't take any meds  . Short-term memory loss   . Long-term memory loss   . Aortic insufficiency     mild-moderate AR by echo 07/2011 (Dr. Rennis Golden)      Past Surgical History  Procedure Date  . Retinal detachment surgery   . Cataract extraction   . Endarterectomy 09/12/2011    Procedure: ENDARTERECTOMY CAROTID;  Surgeon: Sherren Kerns, MD;  Location: [facility] OR;  Service: Vascular;  Laterality: Right;  right carotid artery endarterectomy  with dacron patch angioplasty    in for   Chief Complaint  Patient presents with  . Altered Mental Status     HPI  Ronald Hunt  is a 78 y.o. male, with past medical history of CVA, multiple TIAs, hypertension, diabetes mellitus, presents with complaints of slurred speech and unsteady gait, patient has recent history of June 19 for right-sided endarterectomy for right internal carotid artery stenosis more than 90%, patient CT brain showing left ventricular hypo-density which might present an infarct,  which acuity cannot be determined, patient reports total resolution of her symptoms denies any slurred speech any weakness and new focal weakness any numbness tingling, patient reports he has not been feeling good for the last 2 days where he did not have a good appetite and he didn't feel well in general, patient was found to be in acute renal failure creatinine of 2.2 from normal baseline, and and has leukocytosis of 24,000, with positive UA, even though denies any dysuria or fever.   Review of Systems    In addition to the HPI above, No Fever-chills, reports having poor appetite and decreased oral intake No Headache, No changes with Vision or hearing, No problems swallowing food or Liquids, No Chest pain, Cough or Shortness of Breath, No Abdominal pain, No Nausea or Vommitting, Bowel movements are regular, No Blood in stool or Urine, No dysuria, No new skin  rashes or bruises, No new joints pains-aches,  Complaints of slurred speech and unsteady No recent weight gain or loss, No polyuria, polydypsia or polyphagia, No significant Mental Stressors.  A full 10 point Review of Systems was done, except as stated above, all other Review of Systems were negative.   Social History History  Substance Use Topics  . Smoking status: Never Smoker   . Smokeless tobacco: Not on file  . Alcohol Use: Yes     occasionally beer 2-3 times/month     Family History Family History  Problem Relation Age of Onset  . Cancer Brother   . Crohn's disease Son     Prior to Admission medications   Medication Sig Start Date End Date Taking? Authorizing Provider  amLODipine (NORVASC) 10 MG tablet Take 10 mg by mouth daily.     Yes Historical Provider, MD  aspirin 81 MG chewable tablet Chew 81 mg by mouth daily.   Yes Historical Provider, MD  BETIMOL 0.5 % ophthalmic solution Place 1 drop into both eyes 2 (two) times daily.  07/21/11  Yes Historical Provider, MD  docusate sodium (COLACE) 100 MG capsule  Take 100 mg by mouth 2 (two) times daily.   Yes Historical Provider, MD  glimepiride (AMARYL) 1 MG tablet Take 1 mg by mouth daily before breakfast.  01/13/11  Yes Historical Provider, MD  LUMIGAN 0.01 % SOLN Place 1 drop into both eyes at bedtime.  11/29/10  Yes Historical Provider, MD  olmesartan-hydrochlorothiazide (BENICAR HCT) 20-12.5 MG per tablet Take 1 tablet by mouth daily.     Yes Historical Provider, MD  polyethylene glycol (MIRALAX / GLYCOLAX) packet Take 17 g by mouth daily.   Yes Historical Provider, MD  Tamsulosin HCl (FLOMAX) 0.4 MG CAPS Take 0.4 mg by mouth daily.  12/23/10  Yes Historical Provider, MD    No Known Allergies  Physical Exam  Vitals  Blood pressure 115/43, pulse 71, temperature 99.7 F (37.6 C), temperature source Oral, resp. rate 19, SpO2 100.00%.   1. General elderly male lying in bed in NAD,    2. Normal affect and insight, Not Suicidal or Homicidal, Awake Alert, Oriented X 3.  3. No F.N deficits, ALL C.Nerves Intact, Strength 5/5 all 4 extremities, Sensation intact all 4 extremities, Plantars down going.  4. Ears and Eyes appear Normal, Conjunctivae clear, PERRLA. Moist Oral Mucosa.  5. Supple Neck, No JVD, No cervical lymphadenopathy appriciated, No Carotid Bruits.  6. Symmetrical Chest wall movement, Good air movement bilaterally, CTAB.  7. RRR, No Gallops, Rubs or Murmurs, No Parasternal Heave.  8. Positive Bowel Sounds, Abdomen Soft, Non tender, No organomegaly appriciated,No rebound -guarding or rigidity.  9.  No Cyanosis, Normal Skin Turgor, No Skin Rash or Bruise.  10. Good muscle tone,  joints appear normal , no effusions, Normal ROM.  11. No Palpable Lymph Nodes in Neck or Axillae    Data Review  CBC  Lab 10/20/11 1135 10/20/11 1121  WBC -- 24.7*  HGB 10.9* 10.9*  HCT 32.0* 32.6*  PLT -- 131*  MCV -- 89.1  MCH -- 29.8  MCHC -- 33.4  RDW -- 14.6  LYMPHSABS -- 0.4*  MONOABS -- 1.3*  EOSABS -- 0.0  BASOSABS -- 0.0    BANDABS -- --   ------------------------------------------------------------------------------------------------------------------  Chemistries   Lab 10/20/11 1135 10/20/11 1121  NA 139 136  K 3.0* 2.9*  CL 102 99  CO2 -- 26  GLUCOSE 157* 164*  BUN 44* 46*  CREATININE  2.20* 1.97*  CALCIUM -- 8.6  MG -- --  AST -- 26  ALT -- 10  ALKPHOS -- 72  BILITOT -- 0.8   ------------------------------------------------------------------------------------------------------------------ CrCl is unknown because both a height and weight (above a minimum accepted value) are required for this calculation. ------------------------------------------------------------------------------------------------------------------ No results found for this basename: TSH,T4TOTAL,FREET3,T3FREE,THYROIDAB in the last 72 hours   Coagulation profile  Lab 10/20/11 1121  INR 1.55*  PROTIME --   ------------------------------------------------------------------------------------------------------------------- No results found for this basename: DDIMER:2 in the last 72 hours -------------------------------------------------------------------------------------------------------------------  Cardiac Enzymes  Lab 10/20/11 1122  CKMB 2.9  TROPONINI <0.30  MYOGLOBIN --   ------------------------------------------------------------------------------------------------------------------ No components found with this basename: POCBNP:3   ---------------------------------------------------------------------------------------------------------------  Urinalysis    Component Value Date/Time   COLORURINE YELLOW 10/20/2011 1238   APPEARANCEUR TURBID* 10/20/2011 1238   LABSPEC 1.014 10/20/2011 1238   PHURINE 5.5 10/20/2011 1238   GLUCOSEU NEGATIVE 10/20/2011 1238   HGBUR MODERATE* 10/20/2011 1238   BILIRUBINUR NEGATIVE 10/20/2011 1238   KETONESUR NEGATIVE 10/20/2011 1238   PROTEINUR 30* 10/20/2011 1238   UROBILINOGEN  0.2 10/20/2011 1238   NITRITE POSITIVE* 10/20/2011 1238   LEUKOCYTESUR LARGE* 10/20/2011 1238    ----------------------------------------------------------------------------------------------------------------  Imaging results:   Ct Head Wo Contrast  10/20/2011  *RADIOLOGY REPORT*  Clinical Data: 78 year old male with altered mental status and confusion.  CT HEAD WITHOUT CONTRAST  Technique:  Contiguous axial images were obtained from the base of the skull through the vertex without contrast.  Comparison: 06/18/2004 head CT and 06/07/2009 MRI  Findings: Atrophy and chronic small vessel white matter ischemic changes are again noted.  Focal hypodensity in the left periventricular white matter (images 19 - 20) is new since 2006 and may represent chronic ischemic changes or indeterminate age infarct.  Remote lacunar infarcts within the thalami bilaterally are noted.  No other intracranial abnormalities are identified, including mass lesion or mass effect, hydrocephalus, extra-axial fluid collection, midline shift, hemorrhage, or acute infarction.  The visualized bony calvarium is unremarkable.  IMPRESSION: Left periventricular white matter hypodensity - question chronic ischemic changes or age indeterminate infarct.  Atrophy, chronic small vessel white matter ischemic changes and remote thalamic infarcts.  Original Report Authenticated By: Rosendo Gros, M.D.     Personally reviewed Old Chart   Assessment & Plan  Principal Problem:  *Slurred speech Active Problems:  Carotid stenosis  CVA (cerebral infarction)  HTN (hypertension)  DM (diabetes mellitus)  Glaucoma  GERD (gastroesophageal reflux disease)  Acute renal failure  Hypokalemia    1. slurred speech and unsteady gait. Currently the symptoms resolve, possibly due to TIA , CT brain could not specify the age of the left periventricular CVA, so it might be old, so diagnoses of CVA is unclear at this point, she given 325 of aspirin in ED,  will obtain MRI of the brain, as well will repeat carotid Dopplers bilaterally, and though one was done in May showed 40% stenosis in the left internal carotid artery, but there might be acute events leading to right carotid insufficiency, as well we'll admit patient to telemetry for monitoring for arrhythmias, and will obtain 2-D echo.  2. Urinary tract infection. Patient had strongly positive UA, with leukocytosis of 24,000, was started on Rocephin, will follow urine culture.  3. Acute renal failure. This is most likely due to decreased by mouth intake and dehydration, continue with fluids repeat BMP in a  4. Hypertension. Hold meds patient is appropriately hydrated  5. Diabetes mellitus. Hold oral hypoglycemic agent,  and continue with insulin sliding  7. History of CVA. Continue with aspirin and. Check lipid panel, and if this proves to be of CVA will discuss changing aspirin to Plavix  8. Hypokalemia. Replaced  9. Glaucoma. Continue with home meds his  9. GERD. Continue her on Protonix   DVT Prophylaxis Heparin   AM Labs Ordered, also please review Full Orders  Family Communication: Admission, patients condition and plan of care including tests being ordered have been discussed with the patient  who indicate understanding and agree with the plan and Code Status.  Code Status  full Disposition Plan: home  Time spent in minutes :  Condition GUARDED  Ixel Boehning M.D on 10/20/2011 at 3:19 PM  Between 7am to 7pm - Pager - [phone]  After 7pm go to [email or web address] - password [email or web address]  And look for the night coverage person covering me after hours  [facility] Office  [phone]

## 2011-10-20 NOTE — ED Notes (Signed)
Pt family at bedside, state that patient is not oriented per normal, states patient is normally alert and oriented x3, family states that they have noted changes in patients mental status over last two- three days, pt stated to family yesterday that he had been feeling sick, decreased appetite  over last two days

## 2011-10-20 NOTE — ED Notes (Signed)
Pt tried to void but was unable to at this time 

## 2011-10-20 NOTE — ED Notes (Signed)
Pt in via EMS- pt in from [facility], states that he was last seen on Thursday, today when daughter visited she felt like patient wasn't ambulating as normal and noted slurred speech to daughter. Pt not answering all questions appropriately at this time, no slurred speech noted at this time, pt ambulated for EMS without difficulty on scene. CBG 220.

## 2011-10-20 NOTE — ED Notes (Signed)
Attempted report x1, RN stated they would call back

## 2011-10-20 NOTE — ED Notes (Signed)
Tech at bedside to complete echo, pt returned from radiology.

## 2011-10-21 ENCOUNTER — Inpatient Hospital Stay (HOSPITAL_COMMUNITY): Payer: Medicare Other

## 2011-10-21 DIAGNOSIS — G459 Transient cerebral ischemic attack, unspecified: Secondary | ICD-10-CM | POA: Diagnosis present

## 2011-10-21 LAB — CBC
HCT: 29.5 % — ABNORMAL LOW (ref 39.0–52.0)
Hemoglobin: 9.9 g/dL — ABNORMAL LOW (ref 13.0–17.0)
MCH: 30.1 pg (ref 26.0–34.0)
MCHC: 33.6 g/dL (ref 30.0–36.0)
MCV: 89.7 fL (ref 78.0–100.0)

## 2011-10-21 LAB — GLUCOSE, CAPILLARY
Glucose-Capillary: 151 mg/dL — ABNORMAL HIGH (ref 70–99)
Glucose-Capillary: 180 mg/dL — ABNORMAL HIGH (ref 70–99)

## 2011-10-21 LAB — BASIC METABOLIC PANEL
BUN: 44 mg/dL — ABNORMAL HIGH (ref 6–23)
CO2: 24 mEq/L (ref 19–32)
Chloride: 105 mEq/L (ref 96–112)
Creatinine, Ser: 1.84 mg/dL — ABNORMAL HIGH (ref 0.50–1.35)
GFR calc Af Amer: 37 mL/min — ABNORMAL LOW (ref >90)
Glucose, Bld: 134 mg/dL — ABNORMAL HIGH (ref 70–99)
Potassium: 3.1 mEq/L — ABNORMAL LOW (ref 3.5–5.1)

## 2011-10-21 LAB — LIPID PANEL
Cholesterol: 99 mg/dL (ref 0–200)
HDL: 30 mg/dL — ABNORMAL LOW (ref >39)

## 2011-10-21 MED ORDER — POTASSIUM CHLORIDE CRYS ER 20 MEQ PO TBCR
40.0000 meq | EXTENDED_RELEASE_TABLET | Freq: Every day | ORAL | Status: DC
  Administered 2011-10-22: 40 meq via ORAL
  Filled 2011-10-21 (×2): qty 2

## 2011-10-21 MED ORDER — CLOPIDOGREL BISULFATE 75 MG PO TABS
75.0000 mg | ORAL_TABLET | Freq: Every day | ORAL | Status: DC
  Administered 2011-10-22: 75 mg via ORAL
  Filled 2011-10-21: qty 1

## 2011-10-21 MED ORDER — POTASSIUM CHLORIDE CRYS ER 20 MEQ PO TBCR
40.0000 meq | EXTENDED_RELEASE_TABLET | Freq: Three times a day (TID) | ORAL | Status: AC
  Administered 2011-10-21 (×2): 40 meq via ORAL
  Filled 2011-10-21: qty 2

## 2011-10-21 NOTE — Progress Notes (Signed)
[facility]                                                                                Patient Demographics  Ronald Hunt, is a 78 y.o. male  ZOX:[id number]  WJX:[id number]  DOB - 18-Jun-1923  Admit date - 10/20/2011  Admitting Physician Huey Bienenstock, MD  Outpatient Primary MD for the patient is Terald Sleeper, MD  LOS - 1   Chief Complaint  Patient presents with  . Altered Mental Status        Assessment & Plan  78 year old male with history of CVA, presents with slurred speech and unsteady gait, CT brain showing an stroke but age is unclear, MRI brain does not show an acute stroke.  Principal Problem:  *Slurred speech Active Problems:  Carotid stenosis  CVA (cerebral infarction)  HTN (hypertension)  DM (diabetes mellitus)  Glaucoma  GERD (gastroesophageal reflux disease)  Acute renal failure  Hypokalemia  1. TIA. Patient has no evidence of stroke on MRI brain, symptoms totally resolved, given the fact patient is having recurrent TIAs while on aspirin, will switch to Plavix, PT were consulted. Awaiting 2-D echo and carotid Dopplers,  2. UTI. Improving leukocytosis, afebrile, continue with Rocephin, follow urine culture.  3. Secondary to volume depletion. Improving creatinine, continue with IV fluids  4. Hypertension. Acceptable off meds, continue to  5. Diabetes mellitus. Controlled continue with insulin sliding CBG (last 3)   Basename 10/21/11 1141 10/21/11 0719 10/20/11 2135  GLUCAP 138* 151* 130*   6. Hypokalemia, improving, continue to replace  7. Glaucoma. Continue with home meds   8. GERD. Continue her on Protonix      Code Status: Full  Family Communication: with patient  Disposition Plan: home    Procedures   Ct brain MRI brain   Consults   PT   Antibiotics    Rocephin 7/27   Time Spent in minutes     DVT Prophylaxis  Heparin Randol Kern, Rilya Longo M.D on 10/21/2011 at 12:39  PM  Between 7am to 7pm - Pager - [phone]  After 7pm go to [email or web address] - password [email or web address]  And look for the night coverage person covering for me after hours  [facility] Office  [phone]    Subjective:   Codi Kertz today has, No headache, No chest pain, No abdominal pain - No Nausea, No new weakness tingling or numbness, No Cough - SOB.   Objective:   Filed Vitals:   10/21/11 0200 10/21/11 0400 10/21/11 0600 10/21/11 0900  BP: 137/64 127/63 126/58 130/62  Pulse: 72 80 76 69  Temp: 97.5 F (36.4 C) 99.4 F (37.4 C) 99.3 F (37.4 C) 99 F (37.2 C)  TempSrc:      Resp: 20 21 18 20   SpO2: 94% 94% 93% 93%    Wt Readings from Last 3 Encounters:  10/04/11 87.998 kg (194 lb)  09/12/11 84.5 kg (186 lb 4.6 oz)  09/12/11 84.5 kg (186 lb 4.6 oz)     Intake/Output Summary (Last 24 hours) at 10/21/11 1239 Last data filed at 10/21/11 1032  Gross per 24 hour  Intake   1423 ml  Output  1271 ml  Net    152 ml    Exam Awake Alert, Oriented X 3, No new F.N deficits, Normal affect .AT,PERRAL Supple Neck,No JVD, No cervical lymphadenopathy appriciated.  Symmetrical Chest wall movement, Good air movement bilaterally, CTAB RRR,No Gallops,Rubs or new Murmurs, No Parasternal Heave +ve B.Sounds, Abd Soft, Non tender, No organomegaly appriciated, No rebound - guarding or rigidity. No Cyanosis, Clubbing or edema, No new Rash or bruise    Data Review   CBC  Lab 10/21/11 0600 10/20/11 1135 10/20/11 1121  WBC 17.6* -- 24.7*  HGB 9.9* 10.9* 10.9*  HCT 29.5* 32.0* 32.6*  PLT 122* -- 131*  MCV 89.7 -- 89.1  MCH 30.1 -- 29.8  MCHC 33.6 -- 33.4  RDW 14.7 -- 14.6  LYMPHSABS -- -- 0.4*  MONOABS -- -- 1.3*  EOSABS -- -- 0.0  BASOSABS -- -- 0.0  BANDABS -- -- --    Chemistries   Lab 10/21/11 0600 10/20/11 1135 10/20/11 1121  NA 139 139 136  K 3.1* 3.0* 2.9*  CL 105 102 99  CO2 24 -- 26  GLUCOSE 134* 157* 164*  BUN 44* 44* 46*  CREATININE 1.84*  2.20* 1.97*  CALCIUM 8.2* -- 8.6  MG 2.2 -- --  AST -- -- 26  ALT -- -- 10  ALKPHOS -- -- 72  BILITOT -- -- 0.8   ------------------------------------------------------------------------------------------------------------------ CrCl is unknown because both a height and weight (above a minimum accepted value) are required for this calculation. ------------------------------------------------------------------------------------------------------------------ No results found for this basename: HGBA1C:2 in the last 72 hours ------------------------------------------------------------------------------------------------------------------  Basename 10/21/11 0600  CHOL 99  HDL 30*  LDLCALC 50  TRIG 96  CHOLHDL 3.3  LDLDIRECT --   ------------------------------------------------------------------------------------------------------------------ No results found for this basename: TSH,T4TOTAL,FREET3,T3FREE,THYROIDAB in the last 72 hours ------------------------------------------------------------------------------------------------------------------ No results found for this basename: VITAMINB12:2,FOLATE:2,FERRITIN:2,TIBC:2,IRON:2,RETICCTPCT:2 in the last 72 hours  Coagulation profile  Lab 10/20/11 1121  INR 1.55*  PROTIME --    No results found for this basename: DDIMER:2 in the last 72 hours  Cardiac Enzymes  Lab 10/20/11 1122  CKMB 2.9  TROPONINI <0.30  MYOGLOBIN --   ------------------------------------------------------------------------------------------------------------------ No components found with this basename: POCBNP:3  Micro Results No results found for this or any previous visit (from the past 240 hour(s)).  Radiology Reports Dg Chest 2 View  10/20/2011  *RADIOLOGY REPORT*  Clinical Data: Shortness of breath.  CHEST - 2 VIEW  Comparison: 09/04/2011.  Findings: The cardiac silhouette, mediastinal and hilar contours are within normal limits and stable.  There is  tortuosity and mild ectasia of the thoracic aorta.  Low lung volumes with vascular crowding and atelectasis.  No infiltrates, edema or effusions.  The bony thorax is intact.  IMPRESSION: Low lung volumes with vascular crowding and atelectasis.  No infiltrates, edema or effusions.  Original Report Authenticated By: P. Loralie Champagne, M.D.   Ct Head Wo Contrast  10/20/2011  *RADIOLOGY REPORT*  Clinical Data: 78 year old male with altered mental status and confusion.  CT HEAD WITHOUT CONTRAST  Technique:  Contiguous axial images were obtained from the base of the skull through the vertex without contrast.  Comparison: 06/18/2004 head CT and 06/07/2009 MRI  Findings: Atrophy and chronic small vessel white matter ischemic changes are again noted.  Focal hypodensity in the left periventricular white matter (images 19 - 20) is new since 2006 and may represent chronic ischemic changes or indeterminate age infarct.  Remote lacunar infarcts within the thalami bilaterally are noted.  No other intracranial  abnormalities are identified, including mass lesion or mass effect, hydrocephalus, extra-axial fluid collection, midline shift, hemorrhage, or acute infarction.  The visualized bony calvarium is unremarkable.  IMPRESSION: Left periventricular white matter hypodensity - question chronic ischemic changes or age indeterminate infarct.  Atrophy, chronic small vessel white matter ischemic changes and remote thalamic infarcts.  Original Report Authenticated By: Rosendo Gros, M.D.   Mr Brain Wo Contrast  10/21/2011  *RADIOLOGY REPORT*  Clinical Data: Slurred speech and unsteady gait.  History of CVA, diabetes, hypertension  MRI HEAD WITHOUT CONTRAST  Technique:  Multiplanar, multiecho pulse sequences of the brain and surrounding structures were obtained according to standard protocol without intravenous contrast.  Comparison: CT head 10/20/2011  Findings: Generalized cerebral atrophy.  Chronic ischemic changes throughout the  white matter of a moderate degree.  Chronic infarcts in the thalami bilaterally.  Brainstem and cerebellum are intact.  Negative for acute infarct.  Negative for hemorrhage or mass lesion.  Vessels at the base of the brain are patent.  IMPRESSION: Atrophy and chronic ischemic change.  No acute infarct.  Original Report Authenticated By: Camelia Phenes, M.D.    Scheduled Meds:   . aspirin  324 mg Oral Once  . bimatoprost  1 drop Both Eyes QHS  . cefTRIAXone (ROCEPHIN)  IV  1 g Intravenous Once  . cefTRIAXone (ROCEPHIN)  IV  1 g Intravenous Q24H  . docusate sodium  100 mg Oral BID  . heparin  5,000 Units Subcutaneous Q8H  . insulin aspart  0-9 Units Subcutaneous TID WC  . pantoprazole  40 mg Oral Q1200  . polyethylene glycol  17 g Oral Daily  . potassium chloride  10 mEq Intravenous Once  . potassium chloride  40 mEq Oral Q8H  . potassium chloride  40 mEq Oral Q8H  . potassium chloride  40 mEq Oral Daily  . sodium chloride  3 mL Intravenous Q12H  . Tamsulosin HCl  0.4 mg Oral Daily  . timolol  1 drop Both Eyes BID  . DISCONTD: sodium chloride   Intravenous STAT  . DISCONTD: timolol  1 drop Both Eyes BID   Continuous Infusions:   . sodium chloride 100 mL/hr at 10/20/11 2040   PRN Meds:.acetaminophen, acetaminophen, alum & mag hydroxide-simeth, ondansetron (ZOFRAN) IV, ondansetron (ZOFRAN) IV, ondansetron, senna-docusate

## 2011-10-21 NOTE — Progress Notes (Signed)
Bilateral carotid artery duplex completed.  Preliminary report is no evidence of significant ICA stenosis. 

## 2011-10-22 ENCOUNTER — Encounter (HOSPITAL_COMMUNITY): Payer: Self-pay | Admitting: General Practice

## 2011-10-22 DIAGNOSIS — R4182 Altered mental status, unspecified: Secondary | ICD-10-CM

## 2011-10-22 LAB — CBC
MCHC: 32.9 g/dL (ref 30.0–36.0)
Platelets: 129 10*3/uL — ABNORMAL LOW (ref 150–400)
RDW: 14.5 % (ref 11.5–15.5)
WBC: 12.5 10*3/uL — ABNORMAL HIGH (ref 4.0–10.5)

## 2011-10-22 LAB — LIPID PANEL
Cholesterol: 107 mg/dL (ref 0–200)
LDL Cholesterol: 57 mg/dL (ref 0–99)
Triglycerides: 125 mg/dL (ref <150)

## 2011-10-22 LAB — BASIC METABOLIC PANEL
Chloride: 108 mEq/L (ref 96–112)
Creatinine, Ser: 1.51 mg/dL — ABNORMAL HIGH (ref 0.50–1.35)
GFR calc Af Amer: 46 mL/min — ABNORMAL LOW (ref >90)
GFR calc non Af Amer: 40 mL/min — ABNORMAL LOW (ref >90)
Potassium: 3.6 mEq/L (ref 3.5–5.1)

## 2011-10-22 LAB — GLUCOSE, CAPILLARY: Glucose-Capillary: 140 mg/dL — ABNORMAL HIGH (ref 70–99)

## 2011-10-22 MED ORDER — LEVOFLOXACIN 500 MG PO TABS: 500.0000 mg | ORAL_TABLET | Freq: Every day | ORAL | Status: AC

## 2011-10-22 MED ORDER — OLMESARTAN MEDOXOMIL 20 MG PO TABS: 20.0000 mg | ORAL_TABLET | Freq: Every day | ORAL | Status: DC

## 2011-10-22 MED ORDER — ACETAMINOPHEN 325 MG PO TABS: 325.0000 mg | ORAL_TABLET | Freq: Four times a day (QID) | ORAL | Status: AC | PRN

## 2011-10-22 MED ORDER — CLOPIDOGREL BISULFATE 75 MG PO TABS: 75.0000 mg | ORAL_TABLET | Freq: Every day | ORAL | Status: DC

## 2011-10-22 NOTE — Plan of Care (Signed)
Problem: Phase I Progression Outcomes Goal: Voiding-avoid urinary catheter unless indicated Outcome: Completed/Met Date Met:  10/22/11 Patient with frequency, condom cath applied.

## 2011-10-22 NOTE — Clinical Social Work Note (Signed)
Patient medically stable for discharge back to [facility] today. Clinical information faxed to facility and CSW given the go ahead to send patient. Family aware of discharge and contacted once ambulance called.  Genelle Bal, MSW, LCSW [phone]

## 2011-10-22 NOTE — Clinical Social Work Psychosocial (Signed)
Clinical Social Work Department BRIEF PSYCHOSOCIAL ASSESSMENT 10/22/2011  Patient:  Ronald Hunt, Ronald Hunt     Account Number:  [id number]     Admit date:  10/20/2011  Clinical Social Worker:  Delmer Islam  Date/Time:  10/22/2011 05:20 AM  Referred by:  Physician  Date Referred:  10/21/2011 Referred for  ALF Placement   Other Referral:   Interview type:  Family Other interview type:    PSYCHOSOCIAL DATA Living Status:  FACILITY Admitted from facility:  [facility] Level of care:  Assisted Living Primary support name:   Primary support relationship to patient:  FAMILY Degree of support available:   CSW talked by phone with daughter-in-law    CURRENT CONCERNS Current Concerns  Post-Acute Placement   Other Concerns:    SOCIAL WORK ASSESSMENT / PLAN CSW confirmed with family that facility and family that patient from [facility] and will return. Family requests ambulance transport. CSW contacted daughter-in-law after ambulance called.    FL-2 completed and faxed to facility along with D/C summary and approved.   Assessment/plan status:  No Further Intervention Required Other assessment/ plan:   Information/referral to community resources:    PATIENT'S/FAMILY'S RESPONSE TO PLAN OF CARE: Family appreciative of CSW's involvment in getting patient back to [facility].

## 2011-10-22 NOTE — Evaluation (Signed)
Physical Therapy Evaluation Patient Details Name: Ronald Hunt MRN: [id number] DOB: May 21, 1923 Today's Date: 10/22/2011 Time: 4098-1191 PT Time Calculation (min): 38 min  PT Assessment / Plan / Recommendation Clinical Impression  pt presents with ?TIA.  pt with history of cognitive impairments and feel like this may be near his baseline level of cognition.  pt generally unsteady during mobility and has decreased awareness of safety when he is focused on BM.  pt with 3 very small BMs during PT.  RN made aware.      PT Assessment  Patient needs continued PT services    Follow Up Recommendations  Home health PT;Supervision - Intermittent (at ALF)    Barriers to Discharge None      Equipment Recommendations  Defer to next venue    Recommendations for Other Services     Frequency Min 3X/week    Precautions / Restrictions Precautions Precautions: Fall Restrictions Weight Bearing Restrictions: No   Pertinent Vitals/Pain Denies pain.        Mobility  Bed Mobility Bed Mobility: Supine to Sit;Sit to Supine Supine to Sit: 5: Supervision;With rails;HOB elevated Sit to Supine: 5: Supervision Details for Bed Mobility Assistance: pt impulsive with return to bed and does not stop to sit on EOB, so PT has to quickly move condom cath and IV pole to prevent pulling lines.   Transfers Transfers: Sit to Stand;Stand to Sit Sit to Stand: 5: Supervision;With upper extremity assist;From bed;From chair/3-in-1;With armrests Stand to Sit: 5: Supervision;With upper extremity assist;To chair/3-in-1;With armrests;To bed Details for Transfer Assistance: pt demos good use of UEs, however rushes and not as safe when pt gets urge for BM.   Ambulation/Gait Ambulation/Gait Assistance: 4: Min guard Ambulation Distance (Feet): 50 Feet (10, 20) Assistive device: None Ambulation/Gait Assistance Details: pt once pt in standing notes he needs to have BM.  Amb 10' to 3-in-1, then attempts to amb out to hall  however pt again feels urge for BM.  Quickly returns to 3-in-1.  Again attempts to ambulate, makes it 36' then has to quickly return to 3-in-1 secondary urge for BM.   Gait Pattern: Step-through pattern;Decreased stride length;Trunk flexed;Narrow base of support (very short, choppy steps, not quite festinating.  ) Stairs: No Wheelchair Mobility Wheelchair Mobility: No Modified Rankin (Stroke Patients Only) Pre-Morbid Rankin Score: Moderate disability Modified Rankin: Moderately severe disability    Exercises     PT Diagnosis: Difficulty walking  PT Problem List: Decreased activity tolerance;Decreased balance;Decreased mobility;Decreased cognition;Decreased knowledge of use of DME;Decreased safety awareness PT Treatment Interventions: DME instruction;Gait training;Functional mobility training;Therapeutic activities;Therapeutic exercise;Balance training;Cognitive remediation;Patient/family education   PT Goals Acute Rehab PT Goals PT Goal Formulation: With patient Time For Goal Achievement: 11/05/11 Potential to Achieve Goals: Good Pt will go Supine/Side to Sit: with modified independence PT Goal: Supine/Side to Sit - Progress: Goal set today Pt will go Sit to Supine/Side: with modified independence PT Goal: Sit to Supine/Side - Progress: Goal set today Pt will go Sit to Stand: with modified independence PT Goal: Sit to Stand - Progress: Goal set today Pt will go Stand to Sit: with modified independence PT Goal: Stand to Sit - Progress: Goal set today Pt will Ambulate: >150 feet;with modified independence;with least restrictive assistive device PT Goal: Ambulate - Progress: Goal set today  Visit Information  Last PT Received On: 10/22/11 Assistance Needed: +1    Subjective Data  Subjective: The urge hits me so quickly.   Patient Stated Goal: Home   Prior Functioning  Home Living Type of Home: Assisted living Home Access: Level entry Home Layout: One level Home Adaptive  Equipment: None Additional Comments: pt notes he is trying [facility] to see if he would like living in an ALF.  Prior to this pt had been home alone.   Prior Function Level of Independence: Needs assistance Needs Assistance: Meal Prep;Light Housekeeping Meal Prep: Total Light Housekeeping: Total Driving: No Vocation: Retired Musician: No difficulties    Cognition  Overall Cognitive Status: History of cognitive impairments - at baseline Arousal/Alertness: Awake/alert Orientation Level: Appears intact for tasks assessed Behavior During Session: [facility] for tasks performed Cognition - Other Comments: pt has history of memory impairment, feel this is his baseline cognition and pt seems aware of having memory deficits, but not the extent of deficits.      Extremity/Trunk Assessment Right Lower Extremity Assessment RLE ROM/Strength/Tone: WFL for tasks assessed RLE Sensation: WFL - Light Touch Left Lower Extremity Assessment LLE ROM/Strength/Tone: WFL for tasks assessed LLE Sensation: WFL - Light Touch   Balance Balance Balance Assessed: Yes Static Standing Balance Static Standing - Balance Support: No upper extremity supported;During functional activity Static Standing - Level of Assistance: 5: Stand by assistance Static Standing - Comment/# of Minutes: During hand hygiene at sink pt needs to rest hips against counter top for support.    End of Session PT - End of Session Equipment Utilized During Treatment: Gait belt Activity Tolerance:  (Limited by urge for BMs.  ) Patient left: in bed;with call bell/phone within reach;with bed alarm set Nurse Communication: Mobility status  GP     Sunny Schlein, [place] [phone] 10/22/2011, 11:02 AM

## 2011-10-22 NOTE — Progress Notes (Signed)
Reviewed discharge instructions with patient and he stated his understanding.  Prescriptions placed in packet for [facility].  PTAR transported to ALF.  Colman Cater

## 2011-10-22 NOTE — Discharge Summary (Signed)
[facility]                                                                                   Ronald Hunt, is a 78 y.o. male  DOB 1923-08-05  MRN [id number].  Admission date:  10/20/2011  Discharge Date:  10/22/2011  Primary MD  Terald Sleeper, MD  Admitting Physician  Huey Bienenstock, MD  Admission Diagnosis  Hypokalemia [276.8] Leukocytosis [288.60] Altered mental status [780.97] GERD (gastroesophageal reflux disease) [530.81] HTN (hypertension) [401.9] Pyelonephritis [590.80] Acute renal failure [584.9] Carotid stenosis [433.10] DM (diabetes mellitus) [250.00] Renal insufficiency [593.9] Glaucoma [365.9, 365.70] TIA   transient ischemic attack  Discharge Diagnosis     Principal Problem:  *TIA (transient ischemic attack) Active Problems:  Slurred speech  Carotid stenosis  CVA (cerebral infarction)  HTN (hypertension)  DM (diabetes mellitus)  Glaucoma  GERD (gastroesophageal reflux disease)  Acute renal failure  Hypokalemia      Past Medical History  Diagnosis Date  . Dizziness   . Macular degeneration   . History of TIAs   . Carotid artery occlusion   . Glaucoma   . Stroke     TIA  . Mitral valvular disorder   . Hypertension     takes Amlodipine and Benicar daily  . Heart murmur   . Hoarseness     states pretty much all the time  . Diabetes mellitus     takes Glimepiride daily  . GERD (gastroesophageal reflux disease)     doesn't require meds  . Constipation     occasionally Mag Citrate  . Urinary frequency   . Cancer     prostate;takes Flomax daily  . Nocturia   . Depression     but doesn't require meds  . Anxiety     doesn't take any meds  . Short-term memory loss   . Long-term memory loss   . Aortic insufficiency     mild-moderate AR by echo 07/2011 (Dr. Rennis Golden)    Past Surgical History  Procedure Date  . Retinal detachment surgery   . Cataract extraction   . Endarterectomy 09/12/2011    Procedure:  ENDARTERECTOMY CAROTID;  Surgeon: Sherren Kerns, MD;  Location: [facility] OR;  Service: Vascular;  Laterality: Right;  right carotid artery endarterectomy  with dacron patch angioplasty     Recommendations for primary care physician for things to follow:   Follow on final urine culture sensitivity results.   Discharge Diagnoses:   Principal Problem:  *TIA (transient ischemic attack) Active Problems:  Slurred speech  Carotid stenosis  CVA (cerebral infarction)  HTN (hypertension)  DM (diabetes mellitus)  Glaucoma  GERD (gastroesophageal reflux disease)  Acute renal failure  Hypokalemia    Discharge Condition: stable    Diet recommendation: See Discharge Instructions below   Consults  PT   History of present illness and  Hospital Course:  See H&P, Labs, Consult and Test reports for all details in brief, patient was admitted for an episode of slurred speech and unsteady gait, for one day which was resolved upon presentation, she was found to be in acute renal failure, with severe hypokalemia, and with urine tract infection,  patient had CT of the brain which did show up in the left periventricular area, but age was undetermined so MRI of the brain was obtained and did not show any acute CVA.  1. TIA. Patient did not have any evidence of acute CVA on MRI of the brain, as it appears to be TIA episodes, and given the fact patient is having TIA while on aspirin decision was made to change to by mouth Plavix, patient has known history of recent right-sided endarterectomy, had repeat bilateral carotid Doppler which did not show any significant ICA stenosis, as well had 2-D echo done which did show ejection fraction of 60-65%, with normal wall motion.  2. Acute renal failure this is most likely due to dehydration, she patient initially had creatinine of 2.1, a day of discharge creatinine was 1.5, will hold patient's hydrochlorothiazide on discharge.  3. UTI. Patient had significant  leukocytosis of 24,000, with urine culture growing Escherichia coli in mother 100,000 colonies colonies, and finish total of 3 days of IV Rocephin, will continue another 3 days of by mouth levofloxacin at home, and upon followup with his primary care physician he is to follow on the final urine culture sensitivity, Dopplers count was 12.500 on discharge.  4. hypokalemia. Patient had sodium of 2.9 on general, was replaced is 3.6 at day of discharge, patient to continue with potassium supplement for 5 days after discharge.    Today   Subjective:   Ronald Hunt today has no headache,no chest abdominal pain,no new weakness tingling or numbness, feels much better today.  Objective:   Blood pressure 156/70, pulse 71, temperature 97.2 F (36.2 C), temperature source Oral, resp. rate 20, height 6\' 1"  (1.854 m), weight 90.538 kg (199 lb 9.6 oz), SpO2 94.00%.   Intake/Output Summary (Last 24 hours) at 10/22/11 1204 Last data filed at 10/22/11 0800  Gross per 24 hour  Intake   1840 ml  Output   2151 ml  Net   -311 ml    Exam  Awake Alert, Oriented X 3, No new F.N deficits, Normal affect  [place].AT,PERRAL  Supple Neck,No JVD, No cervical lymphadenopathy appriciated.  Symmetrical Chest wall movement, Good air movement bilaterally, CTAB  RRR,No Gallops,Rubs or new Murmurs, No Parasternal Heave  +ve B.Sounds, Abd Soft, Non tender, No organomegaly appriciated, No rebound - guarding or rigidity.  No Cyanosis, Clubbing or edema, No new Rash or bruise     Data Review   Major procedures and Radiology Reports - PLEASE review detailed and final reports for all details in brief -     Dg Chest 2 View  10/20/2011  *RADIOLOGY REPORT*  Clinical Data: Shortness of breath.  CHEST - 2 VIEW  Comparison: 09/04/2011.  Findings: The cardiac silhouette, mediastinal and hilar contours are within normal limits and stable.  There is tortuosity and mild ectasia of the thoracic aorta.  Low lung volumes with  vascular crowding and atelectasis.  No infiltrates, edema or effusions.  The bony thorax is intact.  IMPRESSION: Low lung volumes with vascular crowding and atelectasis.  No infiltrates, edema or effusions.  Original Report Authenticated By: P. Loralie Champagne, M.D.   Ct Head Wo Contrast  10/20/2011  *RADIOLOGY REPORT*  Clinical Data: 78 year old male with altered mental status and confusion.  CT HEAD WITHOUT CONTRAST  Technique:  Contiguous axial images were obtained from the base of the skull through the vertex without contrast.  Comparison: 06/18/2004 head CT and 06/07/2009 MRI  Findings: Atrophy and chronic small vessel  white matter ischemic changes are again noted.  Focal hypodensity in the left periventricular white matter (images 19 - 20) is new since 2006 and may represent chronic ischemic changes or indeterminate age infarct.  Remote lacunar infarcts within the thalami bilaterally are noted.  No other intracranial abnormalities are identified, including mass lesion or mass effect, hydrocephalus, extra-axial fluid collection, midline shift, hemorrhage, or acute infarction.  The visualized bony calvarium is unremarkable.  IMPRESSION: Left periventricular white matter hypodensity - question chronic ischemic changes or age indeterminate infarct.  Atrophy, chronic small vessel white matter ischemic changes and remote thalamic infarcts.  Original Report Authenticated By: Rosendo Gros, M.D.   Mr Brain Wo Contrast  10/21/2011  *RADIOLOGY REPORT*  Clinical Data: Slurred speech and unsteady gait.  History of CVA, diabetes, hypertension  MRI HEAD WITHOUT CONTRAST  Technique:  Multiplanar, multiecho pulse sequences of the brain and surrounding structures were obtained according to standard protocol without intravenous contrast.  Comparison: CT head 10/20/2011  Findings: Generalized cerebral atrophy.  Chronic ischemic changes throughout the white matter of a moderate degree.  Chronic infarcts in the thalami  bilaterally.  Brainstem and cerebellum are intact.  Negative for acute infarct.  Negative for hemorrhage or mass lesion.  Vessels at the base of the brain are patent.  IMPRESSION: Atrophy and chronic ischemic change.  No acute infarct.  Original Report Authenticated By: Camelia Phenes, M.D.    Micro Results    Recent Results (from the past 240 hour(s))  URINE CULTURE     Status: Normal (Preliminary result)   Collection Time   10/20/11 12:38 PM      Component Value Range Status Comment   Specimen Description URINE, CLEAN CATCH   Final    Special Requests ADDED ON 10/20/11 AT 1406   Final    Culture  Setup Time 10/20/2011 19:29   Final    Colony Count >=100,000 COLONIES/ML   Final    Culture ESCHERICHIA COLI   Final    Report Status PENDING   Incomplete      CBC w Diff: Lab Results  Component Value Date   WBC 12.5* 10/22/2011   HGB 10.3* 10/22/2011   HCT 31.3* 10/22/2011   PLT 129* 10/22/2011   LYMPHOPCT 2* 10/20/2011   MONOPCT 5 10/20/2011   EOSPCT 0 10/20/2011   BASOPCT 0 10/20/2011    CMP: Lab Results  Component Value Date   NA 141 10/22/2011   K 3.6 10/22/2011   CL 108 10/22/2011   CO2 23 10/22/2011   BUN 30* 10/22/2011   CREATININE 1.51* 10/22/2011   PROT 6.4 10/20/2011   ALBUMIN 3.1* 10/20/2011   BILITOT 0.8 10/20/2011   ALKPHOS 72 10/20/2011   AST 26 10/20/2011   ALT 10 10/20/2011  .       Follow-up Information    Follow up with Terald Sleeper, MD. Schedule an appointment as soon as possible for a visit in 5 days.   Contact information:   [address] [place] [address] [phone]            Discharge Medications   Medication List  As of 10/22/2011 12:04 PM   START taking these medications         acetaminophen 325 MG tablet   Commonly known as: TYLENOL   Take 1 tablet (325 mg total) by mouth every 6 (six) hours as needed (or Fever >/= 101).      clopidogrel 75 MG tablet   Commonly  known as: PLAVIX   Take 1 tablet (75 mg total) by  mouth daily with breakfast.      levofloxacin 500 MG tablet   Commonly known as: LEVAQUIN   Take 1 tablet (500 mg total) by mouth daily.   Start taking on: 10/23/2011         CONTINUE taking these medications         amLODipine 10 MG tablet   Commonly known as: NORVASC      BETIMOL 0.5 % ophthalmic solution   Generic drug: timolol      glimepiride 1 MG tablet   Commonly known as: AMARYL      LUMIGAN 0.01 % Soln   Generic drug: bimatoprost      Tamsulosin HCl 0.4 MG Caps   Commonly known as: FLOMAX         STOP taking these medications         aspirin 81 MG chewable tablet      docusate sodium 100 MG capsule      olmesartan-hydrochlorothiazide 20-12.5 MG per tablet      polyethylene glycol packet          Where to get your medications    These are the prescriptions that you need to pick up.   You may get these medications from any pharmacy.         acetaminophen 325 MG tablet   clopidogrel 75 MG tablet   levofloxacin 500 MG tablet               Total Time in preparing paper work, data evaluation and todays exam - 35 minutes  Xavier Fournier M.D on 10/22/2011 at 12:04 PM  [facility] Office  [phone]

## 2011-10-23 LAB — URINE CULTURE: Colony Count: >=100000

## 2011-10-24 NOTE — Progress Notes (Signed)
Retro ur review 

## 2011-10-30 ENCOUNTER — Telehealth: Payer: Self-pay | Admitting: Gastroenterology

## 2011-10-30 NOTE — Telephone Encounter (Signed)
Pt's daughter is calling about her fathers constipation, he has never been seen by a GI.  He is currently in an assisted living facility.  I advised the pt's daughter to call his PCP and have him evaluated and if he feels that a GI referral is needed then the appt can be scheduled.  She will call the PCP today and call back if appt is needed

## 2011-11-23 NOTE — ED Provider Notes (Addendum)
Medical screening examination/treatment/procedure(s) were conducted as a shared visit with non-physician practitioner(s) and myself.  I personally evaluated the patient during the encounter.  Pt altered on eval.  Broad abx ordered.  Plan admit  Tobin Chad, MD 11/23/11 1728  Tobin Chad, MD 11/23/11 1610

## 2011-12-04 ENCOUNTER — Ambulatory Visit: Payer: Medicare Other | Admitting: Gastroenterology

## 2011-12-10 ENCOUNTER — Emergency Department (HOSPITAL_COMMUNITY)
Admission: EM | Admit: 2011-12-10 | Discharge: 2011-12-11 | Discharge status: Against Medical Advice | Payer: Medicare Other | Attending: Emergency Medicine | Admitting: Emergency Medicine

## 2011-12-10 ENCOUNTER — Encounter (HOSPITAL_COMMUNITY): Payer: Self-pay | Admitting: Family Medicine

## 2011-12-10 DIAGNOSIS — T83091A Other mechanical complication of indwelling urethral catheter, initial encounter: Secondary | ICD-10-CM | POA: Insufficient documentation

## 2011-12-10 DIAGNOSIS — X58XXXA Exposure to other specified factors, initial encounter: Secondary | ICD-10-CM | POA: Insufficient documentation

## 2011-12-10 NOTE — ED Notes (Signed)
Pt reports he has been eating and drinking like normal, states he has emptied urinary bag 2-3 times today but it has had a smaller amount of urine in it than normal. Pt denies any discomfort or feeling of urinary retention. States wants to get catheter checked. NAD noted.

## 2012-01-18 ENCOUNTER — Other Ambulatory Visit: Payer: Self-pay | Admitting: Urology

## 2012-01-18 DIAGNOSIS — C61 Malignant neoplasm of prostate: Secondary | ICD-10-CM

## 2012-01-30 ENCOUNTER — Encounter (HOSPITAL_COMMUNITY)
Admission: RE | Admit: 2012-01-30 | Discharge: 2012-01-30 | Disposition: A | Discharge status: Home / Self Care | Payer: Medicare Other | Source: Ambulatory Visit | Attending: Urology | Admitting: Urology

## 2012-01-30 DIAGNOSIS — C61 Malignant neoplasm of prostate: Secondary | ICD-10-CM | POA: Insufficient documentation

## 2012-01-30 MED ORDER — TECHNETIUM TC 99M MEDRONATE IV KIT
23.1000 | PACK | Freq: Once | INTRAVENOUS | Status: AC | PRN
  Administered 2012-01-30: 23.1 via INTRAVENOUS

## 2012-04-09 ENCOUNTER — Encounter: Payer: Self-pay | Admitting: Neurosurgery

## 2012-04-10 ENCOUNTER — Ambulatory Visit (INDEPENDENT_AMBULATORY_CARE_PROVIDER_SITE_OTHER): Payer: Medicare Other | Admitting: Vascular Surgery

## 2012-04-10 ENCOUNTER — Encounter: Payer: Self-pay | Admitting: Neurosurgery

## 2012-04-10 ENCOUNTER — Ambulatory Visit (INDEPENDENT_AMBULATORY_CARE_PROVIDER_SITE_OTHER): Payer: Medicare Other | Admitting: Neurosurgery

## 2012-04-10 VITALS — BP 125/62 | HR 57 | Resp 16 | Ht 73.0 in | Wt 186.2 lb

## 2012-04-10 DIAGNOSIS — Z48812 Encounter for surgical aftercare following surgery on the circulatory system: Secondary | ICD-10-CM

## 2012-04-10 DIAGNOSIS — I6529 Occlusion and stenosis of unspecified carotid artery: Secondary | ICD-10-CM

## 2012-04-10 NOTE — Progress Notes (Signed)
[facility] Carotid Office Note  CC: Carotid surveillance Referring Physician: Fields  History of Present Illness: 79 year old male patient of Dr. Darrick Penna who status post right CEA in June 2013. The patient denies any signs or symptoms of CVA, TIA, amaurosis fugax or any neural deficit the patient denies any new medical diagnoses or recent surgery.  Past Medical History  Diagnosis Date  . Dizziness   . Macular degeneration   . History of TIAs   . Carotid artery occlusion   . Glaucoma   . Stroke     TIA  . Mitral valvular disorder   . Hypertension     takes Amlodipine and Benicar daily  . Heart murmur   . Hoarseness     states pretty much all the time  . Diabetes mellitus     takes Glimepiride daily  . GERD (gastroesophageal reflux disease)     doesn't require meds  . Constipation     occasionally Mag Citrate  . Urinary frequency   . Cancer     prostate;takes Flomax daily  . Nocturia   . Depression     but doesn't require meds  . Anxiety     doesn't take any meds  . Short-term memory loss   . Long-term memory loss   . Aortic insufficiency     mild-moderate AR by echo 07/2011 (Dr. Rennis Golden)  . Acute kidney failure, unspecified   . Urinary tract infection, site not specified   . Orthostatic hypotension   . Dementia   . Other symptoms involving cardiovascular system   . Unspecified transient cerebral ischemia   . Unspecified vitamin D deficiency   . Malignant neoplasm of prostate   . Unspecified glaucoma(365.9)   . Senile cataract, unspecified   . Unspecified essential hypertension   . Other abnormal blood chemistry   . Proteinuria     ROS: [x]  Positive   [ ]  Denies    General: [ ]  Weight loss, [ ]  Fever, [ ]  chills Neurologic: [ ]  Dizziness, [ ]  Blackouts, [ ]  Seizure [ ]  Stroke, [ ]  "Mini stroke", [ ]  Slurred speech, [ ]  Temporary blindness; [ ]  weakness in arms or legs, [ ]  Hoarseness Cardiac: [ ]  Chest pain/pressure, [ ]  Shortness  of breath at rest [ ]  Shortness of breath with exertion, [ ]  Atrial fibrillation or irregular heartbeat Vascular: [ ]  Pain in legs with walking, [ ]  Pain in legs at rest, [ ]  Pain in legs at night,  [ ]  Non-healing ulcer, [ ]  Blood clot in vein/DVT,   Pulmonary: [ ]  Home oxygen, [ ]  Productive cough, [ ]  Coughing up blood, [ ]  Asthma,  [ ]  Wheezing Musculoskeletal:  [ ]  Arthritis, [ ]  Low back pain, [ ]  Joint pain Hematologic: [ ]  Easy Bruising, [ ]  Anemia; [ ]  Hepatitis Gastrointestinal: [ ]  Blood in stool, [ ]  Gastroesophageal Reflux/heartburn, [ ]  Trouble swallowing Urinary: [ ]  chronic Kidney disease, [ ]  on HD - [ ]  MWF or [ ]  TTHS, [ ]  Burning with urination, [ ]  Difficulty urinating Skin: [ ]  Rashes, [ ]  Wounds Psychological: [ ]  Anxiety, [ ]  Depression   Social History History  Substance Use Topics  . Smoking status: Never Smoker   . Smokeless tobacco: Never Used  . Alcohol Use: Yes     Comment: occasionally beer 2-3 times/month    Family History Family History  Problem Relation Age of Onset  . Cancer Brother   .  Crohn's disease Son     No Known Allergies  Current Outpatient Prescriptions  Medication Sig Dispense Refill  . acetaminophen (TYLENOL) 325 MG tablet Take 1 tablet (325 mg total) by mouth every 6 (six) hours as needed (or Fever >/= 101).  30 tablet  0  . amLODipine (NORVASC) 10 MG tablet Take 10 mg by mouth daily.        Marland Kitchen BETIMOL 0.5 % ophthalmic solution Place 1 drop into both eyes 2 (two) times daily.       . clopidogrel (PLAVIX) 75 MG tablet Take 1 tablet (75 mg total) by mouth daily with breakfast.  30 tablet  0  . glimepiride (AMARYL) 1 MG tablet Take 1 mg by mouth daily before breakfast.       . LUMIGAN 0.01 % SOLN Place 1 drop into both eyes at bedtime.       Marland Kitchen olmesartan (BENICAR) 20 MG tablet Take 1 tablet (20 mg total) by mouth daily.  30 tablet  0  . Tamsulosin HCl (FLOMAX) 0.4 MG CAPS Take 0.4 mg by mouth daily.         Physical  Examination  Filed Vitals:   04/10/12 1016  BP: 125/62  Pulse: 57  Resp:     Body mass index is 24.57 kg/(m^2).  General:  WDWN in NAD Gait: Normal HEENT: WNL Eyes: Pupils equal Pulmonary: normal non-labored breathing , without Rales, rhonchi,  wheezing Cardiac: RRR, without  Murmurs, rubs or gallops; Abdomen: soft, NT, no masses Skin: no rashes, ulcers noted  Vascular Exam Pulses: 2+ radial pulses bilaterally Carotid bruits: Carotid pulses to auscultation no bruits are heard Extremities without ischemic changes, no Gangrene , no cellulitis; no open wounds;  Musculoskeletal: no muscle wasting or atrophy   Neurologic: A&O X 3; Appropriate Affect ; SENSATION: normal; MOTOR FUNCTION:  moving all extremities equally. Speech is fluent/normal  Non-Invasive Vascular Imaging CAROTID DUPLEX 04/10/2012  Right ICA 0 - 19% stenosis Left ICA 20 - 39 % stenosis   ASSESSMENT/PLAN: Asymptomatic patient with a patent right ICA and unchanged left ICA, the patient will followup in 6 months for a second postop carotid duplex. The patient's questions were encouraged and answered, he is in agreement with this plan.  Lauree Chandler ANP   Clinic MD: Darrick Penna

## 2012-04-10 NOTE — Addendum Note (Signed)
Addended by: Sharee Pimple on: 04/10/2012 03:08 PM   Modules accepted: Orders

## 2012-04-10 NOTE — Progress Notes (Signed)
Carotid duplex performed @ [facility] 04/10/2012

## 2012-06-11 ENCOUNTER — Telehealth: Payer: Self-pay | Admitting: *Deleted

## 2012-06-11 NOTE — Telephone Encounter (Signed)
[facility] called and wanted to know how many times patient is checking his blood sugars. I called [name] NP, and she stated that Ronald Hunt changed him to twice a day

## 2012-06-12 ENCOUNTER — Telehealth: Payer: Self-pay | Admitting: *Deleted

## 2012-06-12 NOTE — Telephone Encounter (Addendum)
Facility called and stated that Ronald Hunt signed a form stating that patient should be checking blood sugars four times a day. I told her that I asked Ronald Hunt how many times a day the patient should be checking his blood sugars, she stated that she thought Ronald Hunt changed it to twice a day.   The peason from the facility stated that she was going to call the family members and hung up in my face. I read Ronald Hunt note a couple a times and it really does not say how many times a day patient should be taking.    Ronald Hunt and Ronald Hunt Please talk about this matter and get back with me. Thanks

## 2012-06-12 NOTE — Telephone Encounter (Signed)
Marcene Corning (son) called and had some concerns about how many times that his father should be taking his blood sugars. He also stated that he would be fines with whatever Ronald Hunt decides on. I told him that it would not be until Monday when I got back with him. He thank me and I hug up.      [phone] Marcene Corning

## 2012-06-14 IMAGING — CR DG CHEST 2V
2 series · 2 of 2 positions shown · non-contrast
Comparison: 06/18/2004.

CLINICAL DATA: History of mitral valvular disorder.  History of
murmur.

CHEST - 2 VIEW

[view not recorded (1 of 2)]
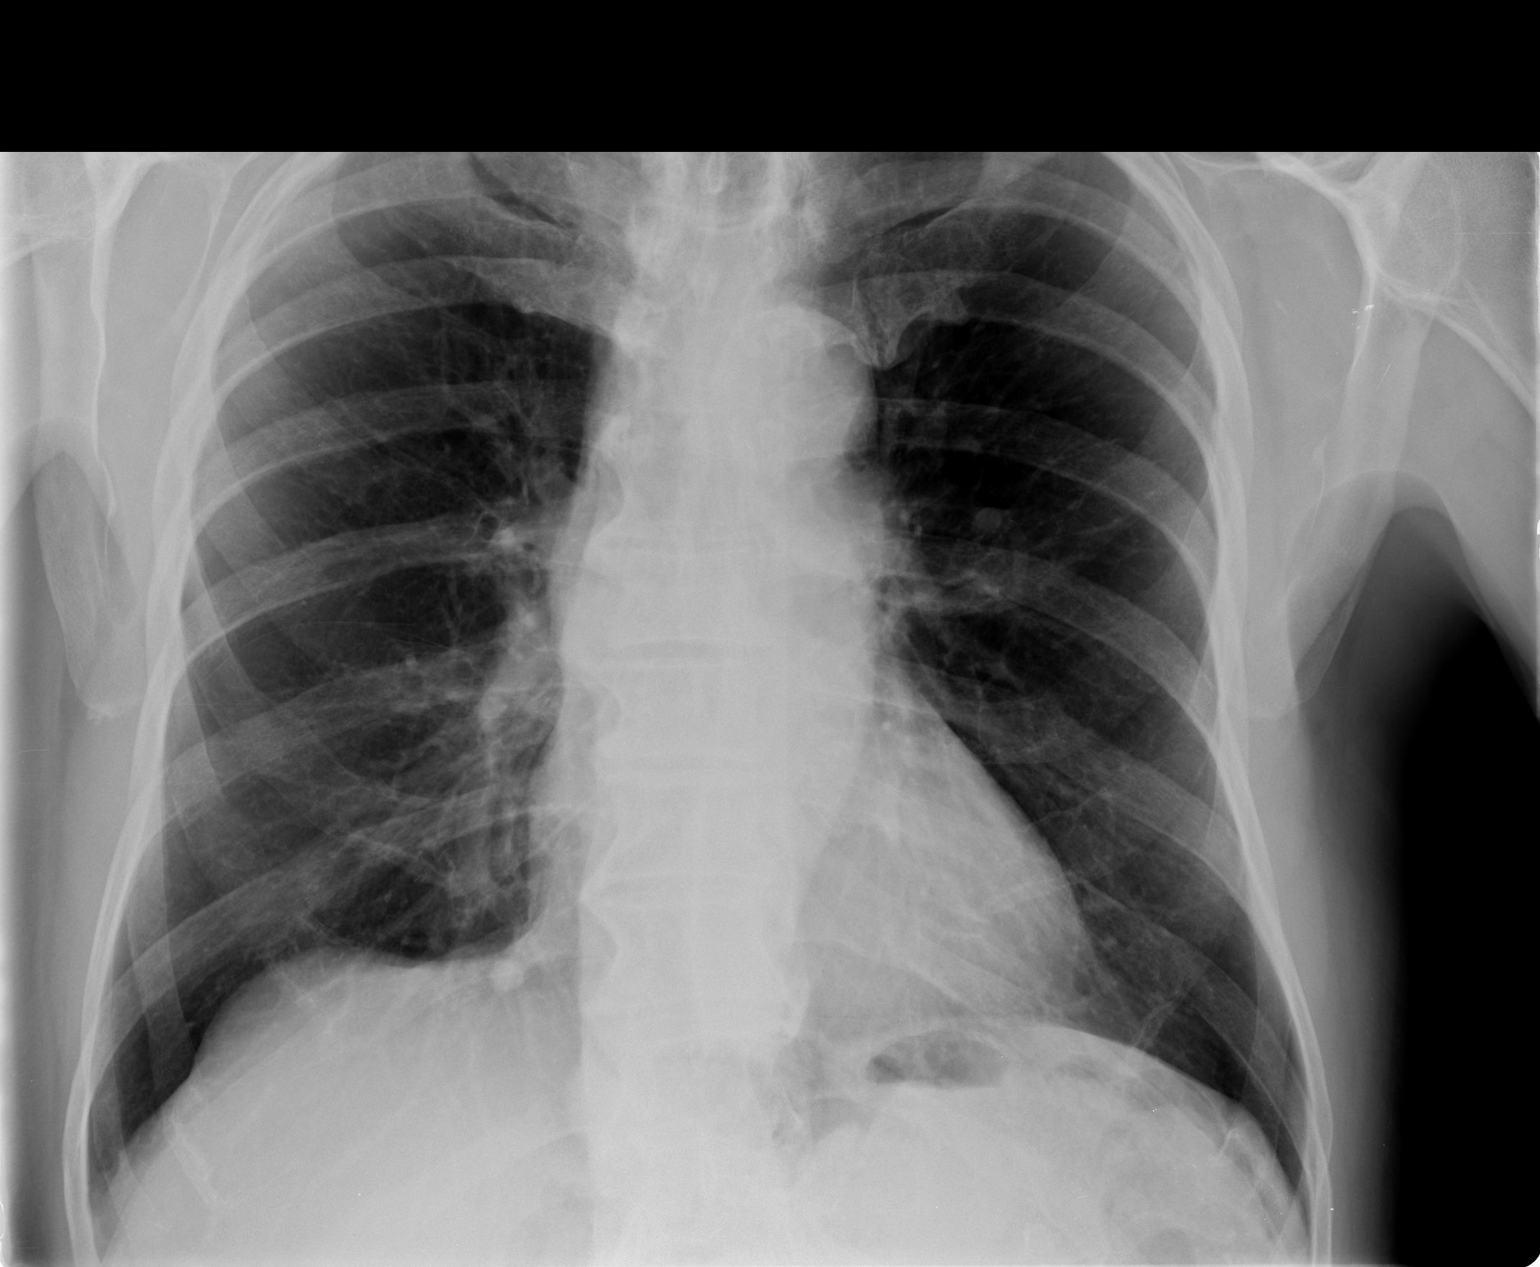

[view not recorded (2 of 2)]
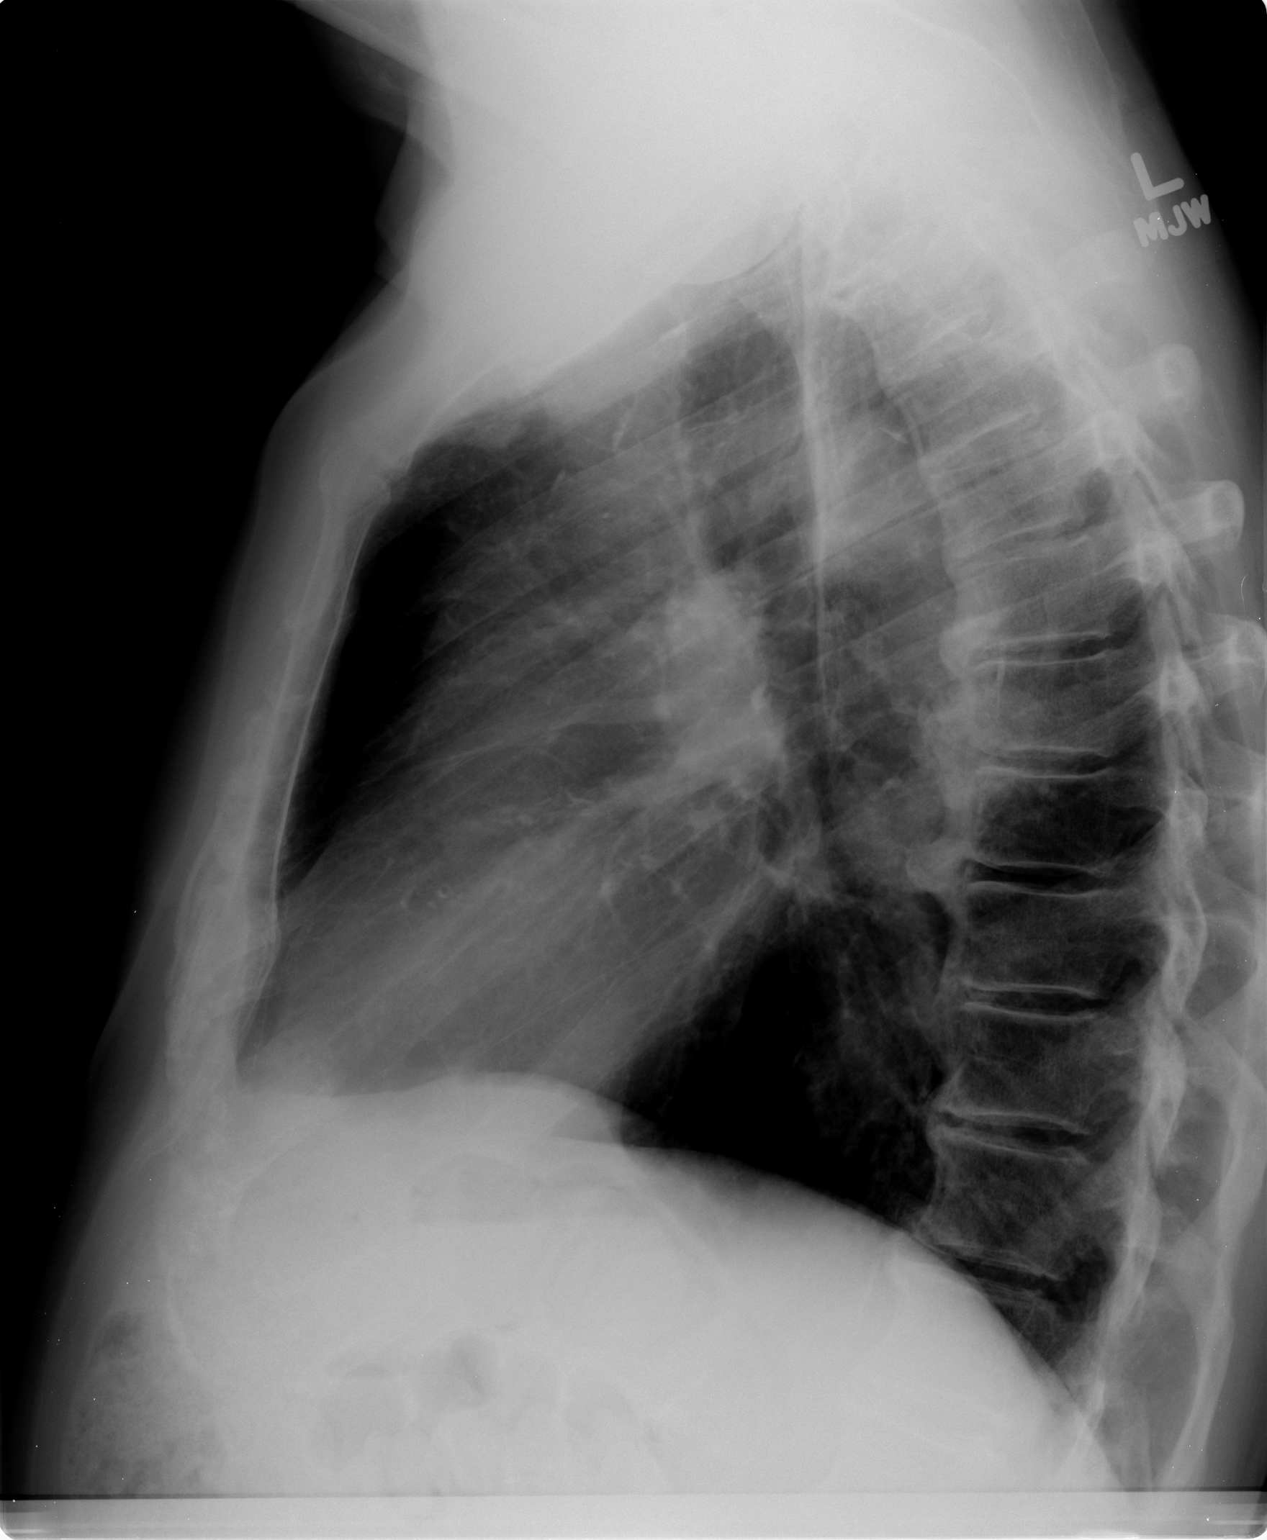

[2 of 2 positions shown; findings below may reference images not displayed]

FINDINGS: Cardiac silhouette is normal size and shape.  Ectasia and
tortuosity of thoracic aorta are present.  Mediastinal and hilar
contours appear stable. No pleural abnormality is evident.  No
pulmonary infiltrates or nodules were evident.  There is slight
hyperinflation configuration.  Osteophytes are present in the
spine.
IMPRESSION: No evidence of pulmonary edema, pneumonia, or pleural effusion.
Slight hyperinflation configuration.  Osteophytes are present in
the spine.

## 2012-06-16 NOTE — Telephone Encounter (Signed)
LMOVM to call back 

## 2012-06-16 NOTE — Telephone Encounter (Signed)
Per [name], her and [name] will discuss at patirnt appointment on 06/23/2012. Facility are to keep doing what they are doing until then. Ronald Hunt son has been notified

## 2012-06-20 ENCOUNTER — Encounter (HOSPITAL_COMMUNITY): Payer: Self-pay | Admitting: *Deleted

## 2012-06-20 ENCOUNTER — Emergency Department (HOSPITAL_COMMUNITY): Payer: Medicare Other

## 2012-06-20 ENCOUNTER — Inpatient Hospital Stay (HOSPITAL_COMMUNITY)
Admission: EM | Admit: 2012-06-20 | Discharge: 2012-06-26 | DRG: 884 | Disposition: A | Discharge status: Skilled Nursing Facility | Payer: Medicare Other | Attending: Internal Medicine | Admitting: Internal Medicine

## 2012-06-20 DIAGNOSIS — R4182 Altered mental status, unspecified: Secondary | ICD-10-CM

## 2012-06-20 DIAGNOSIS — I359 Nonrheumatic aortic valve disorder, unspecified: Secondary | ICD-10-CM | POA: Diagnosis present

## 2012-06-20 DIAGNOSIS — R4789 Other speech disturbances: Secondary | ICD-10-CM

## 2012-06-20 DIAGNOSIS — F0391 Unspecified dementia with behavioral disturbance: Secondary | ICD-10-CM | POA: Diagnosis present

## 2012-06-20 DIAGNOSIS — Z8673 Personal history of transient ischemic attack (TIA), and cerebral infarction without residual deficits: Secondary | ICD-10-CM

## 2012-06-20 DIAGNOSIS — R279 Unspecified lack of coordination: Secondary | ICD-10-CM

## 2012-06-20 DIAGNOSIS — H3530 Unspecified macular degeneration: Secondary | ICD-10-CM | POA: Diagnosis present

## 2012-06-20 DIAGNOSIS — E119 Type 2 diabetes mellitus without complications: Secondary | ICD-10-CM | POA: Diagnosis present

## 2012-06-20 DIAGNOSIS — R270 Ataxia, unspecified: Secondary | ICD-10-CM

## 2012-06-20 DIAGNOSIS — F03918 Unspecified dementia, unspecified severity, with other behavioral disturbance: Secondary | ICD-10-CM | POA: Diagnosis present

## 2012-06-20 DIAGNOSIS — K219 Gastro-esophageal reflux disease without esophagitis: Secondary | ICD-10-CM | POA: Diagnosis present

## 2012-06-20 DIAGNOSIS — N179 Acute kidney failure, unspecified: Secondary | ICD-10-CM

## 2012-06-20 DIAGNOSIS — I658 Occlusion and stenosis of other precerebral arteries: Secondary | ICD-10-CM | POA: Diagnosis present

## 2012-06-20 DIAGNOSIS — I10 Essential (primary) hypertension: Secondary | ICD-10-CM | POA: Diagnosis present

## 2012-06-20 DIAGNOSIS — I6789 Other cerebrovascular disease: Secondary | ICD-10-CM | POA: Diagnosis present

## 2012-06-20 DIAGNOSIS — I6529 Occlusion and stenosis of unspecified carotid artery: Secondary | ICD-10-CM | POA: Diagnosis present

## 2012-06-20 DIAGNOSIS — H409 Unspecified glaucoma: Secondary | ICD-10-CM

## 2012-06-20 DIAGNOSIS — Z794 Long term (current) use of insulin: Secondary | ICD-10-CM

## 2012-06-20 DIAGNOSIS — G459 Transient cerebral ischemic attack, unspecified: Secondary | ICD-10-CM

## 2012-06-20 DIAGNOSIS — Z79899 Other long term (current) drug therapy: Secondary | ICD-10-CM

## 2012-06-20 DIAGNOSIS — C61 Malignant neoplasm of prostate: Secondary | ICD-10-CM | POA: Diagnosis present

## 2012-06-20 DIAGNOSIS — F0390 Unspecified dementia without behavioral disturbance: Principal | ICD-10-CM | POA: Diagnosis present

## 2012-06-20 DIAGNOSIS — I639 Cerebral infarction, unspecified: Secondary | ICD-10-CM

## 2012-06-20 DIAGNOSIS — E876 Hypokalemia: Secondary | ICD-10-CM

## 2012-06-20 DIAGNOSIS — Z7902 Long term (current) use of antithrombotics/antiplatelets: Secondary | ICD-10-CM

## 2012-06-20 DIAGNOSIS — R4781 Slurred speech: Secondary | ICD-10-CM

## 2012-06-20 LAB — CBC
HCT: 33 % — ABNORMAL LOW (ref 39.0–52.0)
MCH: 29.8 pg (ref 26.0–34.0)
MCHC: 34.8 g/dL (ref 30.0–36.0)
MCV: 85.5 fL (ref 78.0–100.0)
Platelets: 135 10*3/uL — ABNORMAL LOW (ref 150–400)
RDW: 13.6 % (ref 11.5–15.5)
WBC: 6.8 10*3/uL (ref 4.0–10.5)

## 2012-06-20 LAB — URINALYSIS, ROUTINE W REFLEX MICROSCOPIC
Bilirubin Urine: NEGATIVE
Ketones, ur: 15 mg/dL — AB
Nitrite: NEGATIVE
Specific Gravity, Urine: 1.022 (ref 1.005–1.030)
Urobilinogen, UA: 1 mg/dL (ref 0.0–1.0)

## 2012-06-20 LAB — COMPREHENSIVE METABOLIC PANEL
AST: 12 U/L (ref 0–37)
Albumin: 3.4 g/dL — ABNORMAL LOW (ref 3.5–5.2)
BUN: 25 mg/dL — ABNORMAL HIGH (ref 6–23)
Calcium: 9.3 mg/dL (ref 8.4–10.5)
Creatinine, Ser: 1.36 mg/dL — ABNORMAL HIGH (ref 0.50–1.35)
Total Protein: 6.4 g/dL (ref 6.0–8.3)

## 2012-06-20 LAB — URINE MICROSCOPIC-ADD ON

## 2012-06-20 LAB — GLUCOSE, CAPILLARY: Glucose-Capillary: 163 mg/dL — ABNORMAL HIGH (ref 70–99)

## 2012-06-20 MED ORDER — ONDANSETRON HCL 4 MG/2ML IJ SOLN: 4.0000 mg | Freq: Three times a day (TID) | INTRAMUSCULAR | Status: AC | PRN

## 2012-06-20 MED ORDER — CLOPIDOGREL BISULFATE 75 MG PO TABS
75.0000 mg | ORAL_TABLET | Freq: Every day | ORAL | Status: DC
  Administered 2012-06-21 – 2012-06-26 (×6): 75 mg via ORAL
  Filled 2012-06-20 (×10): qty 1

## 2012-06-20 MED ORDER — INSULIN ASPART 100 UNIT/ML ~~LOC~~ SOLN
0.0000 [IU] | Freq: Three times a day (TID) | SUBCUTANEOUS | Status: DC
  Administered 2012-06-21: 3 [IU] via SUBCUTANEOUS
  Administered 2012-06-21 (×2): 1 [IU] via SUBCUTANEOUS
  Administered 2012-06-22: 2 [IU] via SUBCUTANEOUS
  Administered 2012-06-22 (×2): 3 [IU] via SUBCUTANEOUS
  Administered 2012-06-23: 5 [IU] via SUBCUTANEOUS
  Administered 2012-06-23: 3 [IU] via SUBCUTANEOUS
  Administered 2012-06-23: 2 [IU] via SUBCUTANEOUS
  Administered 2012-06-24: 5 [IU] via SUBCUTANEOUS
  Administered 2012-06-24: 3 [IU] via SUBCUTANEOUS
  Administered 2012-06-24: 1 [IU] via SUBCUTANEOUS
  Administered 2012-06-25: 3 [IU] via SUBCUTANEOUS
  Administered 2012-06-25 (×2): 1 [IU] via SUBCUTANEOUS
  Administered 2012-06-26: 3 [IU] via SUBCUTANEOUS
  Administered 2012-06-26: 2 [IU] via SUBCUTANEOUS

## 2012-06-20 MED ORDER — TAMSULOSIN HCL 0.4 MG PO CAPS
0.4000 mg | ORAL_CAPSULE | Freq: Two times a day (BID) | ORAL | Status: DC
  Administered 2012-06-20 – 2012-06-26 (×11): 0.4 mg via ORAL
  Filled 2012-06-20 (×13): qty 1

## 2012-06-20 MED ORDER — AMLODIPINE BESYLATE 10 MG PO TABS
10.0000 mg | ORAL_TABLET | Freq: Every day | ORAL | Status: DC
  Administered 2012-06-21 – 2012-06-26 (×6): 10 mg via ORAL
  Filled 2012-06-20 (×6): qty 1

## 2012-06-20 MED ORDER — ENOXAPARIN SODIUM 40 MG/0.4ML ~~LOC~~ SOLN
40.0000 mg | SUBCUTANEOUS | Status: DC
  Administered 2012-06-21 – 2012-06-25 (×5): 40 mg via SUBCUTANEOUS
  Filled 2012-06-20 (×7): qty 0.4

## 2012-06-20 MED ORDER — SODIUM CHLORIDE 0.9 % IV BOLUS (SEPSIS)
2000.0000 mL | Freq: Once | INTRAVENOUS | Status: AC
  Administered 2012-06-20: 2000 mL via INTRAVENOUS

## 2012-06-20 MED ORDER — SENNOSIDES-DOCUSATE SODIUM 8.6-50 MG PO TABS: 1.0000 | ORAL_TABLET | Freq: Every evening | ORAL | Status: DC | PRN

## 2012-06-20 MED ORDER — TIMOLOL HEMIHYDRATE 0.5 % OP SOLN: 1.0000 [drp] | Freq: Two times a day (BID) | OPHTHALMIC | Status: DC

## 2012-06-20 MED ORDER — BIMATOPROST 0.01 % OP SOLN
1.0000 [drp] | Freq: Every day | OPHTHALMIC | Status: DC
Start: 2012-06-20 — End: 2012-06-26
  Administered 2012-06-20 – 2012-06-25 (×5): 1 [drp] via OPHTHALMIC
  Filled 2012-06-20 (×2): qty 2.5

## 2012-06-20 MED ORDER — IRBESARTAN 150 MG PO TABS
150.0000 mg | ORAL_TABLET | Freq: Every day | ORAL | Status: DC
  Administered 2012-06-21 – 2012-06-26 (×6): 150 mg via ORAL
  Filled 2012-06-20 (×6): qty 1

## 2012-06-20 MED ORDER — TIMOLOL MALEATE 0.5 % OP SOLN
1.0000 [drp] | Freq: Two times a day (BID) | OPHTHALMIC | Status: DC
  Administered 2012-06-20 – 2012-06-26 (×11): 1 [drp] via OPHTHALMIC
  Filled 2012-06-20 (×2): qty 5

## 2012-06-20 NOTE — ED Notes (Signed)
PT ambulates in hall to restroom. States chest pain of 2 when she got up.

## 2012-06-20 NOTE — Consult Note (Signed)
Referring Physician: Rhunette Croft    Chief Complaint: Speech disturbance  HPI: Ronald Hunt is an 79 y.o. male with a history of TIA's and CEA who awakened today with difficulty with speech.  His son provides much of the history. It seems that yesterday he was at baseline for the most part other than not looking quite right per family when he went to the doctor.  He was able to come back and eat normally.  Gait was normal and he had his usual conversation with his sone by phone in the evening.  This morning his speech was altered.  At times it was jibberish.  His gait seemed slow as well although he maintains the ability to ambulate.  He is at baseline often uncooperative but today has been more easy-going than usual.  He has had many of these type episodes in the past but they usually last about 15 minutes before resolving on their own.  This particular event has been more protracted.    Date last known well: 06/19/2012 Time last known well: 1915 tPA Given: No: Presented outside time window  Past Medical History  Diagnosis Date  . Dizziness   . Macular degeneration   . History of TIAs   . Carotid artery occlusion   . Glaucoma   . Stroke     TIA  . Mitral valvular disorder   . Hypertension     takes Amlodipine and Benicar daily  . Heart murmur   . Hoarseness     states pretty much all the time  . Diabetes mellitus     takes Glimepiride daily  . GERD (gastroesophageal reflux disease)     doesn't require meds  . Constipation     occasionally Mag Citrate  . Urinary frequency   . Cancer     prostate;takes Flomax daily  . Nocturia   . Depression     but doesn't require meds  . Anxiety     doesn't take any meds  . Short-term memory loss   . Long-term memory loss   . Aortic insufficiency     mild-moderate AR by echo 07/2011 (Dr. Rennis Golden)  . Acute kidney failure, unspecified   . Urinary tract infection, site not specified   . Orthostatic hypotension   . Dementia   . Other symptoms  involving cardiovascular system   . Unspecified transient cerebral ischemia   . Unspecified vitamin D deficiency   . Malignant neoplasm of prostate   . Unspecified glaucoma(365.9)   . Senile cataract, unspecified   . Unspecified essential hypertension   . Other abnormal blood chemistry   . Proteinuria     Past Surgical History  Procedure Laterality Date  . Retinal detachment surgery    . Cataract extraction    . Endarterectomy  09/12/2011    Procedure: ENDARTERECTOMY CAROTID;  Surgeon: Sherren Kerns, MD;  Location: [facility] OR;  Service: Vascular;  Laterality: Right;  right carotid artery endarterectomy  with dacron patch angioplasty  . Carotid endarterectomy  09/12/11    RIGHT  cea    Family History  Problem Relation Age of Onset  . Cancer Brother   . Crohn's disease Son    Social History:  reports that he has never smoked. He has never used smokeless tobacco. He reports that  drinks alcohol. He reports that he does not use illicit drugs.  Allergies: No Known Allergies  Medications: I have reviewed the patient's current medications. Prior to Admission:  Current outpatient prescriptions: acetaminophen (TYLENOL)  325 MG tablet, Take 1 tablet (325 mg total) by mouth every 6 (six) hours as needed (or Fever >/= 101)., Disp: 30 tablet, Rfl: 0;   amLODipine (NORVASC) 10 MG tablet, Take 10 mg by mouth daily.  , Disp: , Rfl: ;   BETIMOL 0.5 % ophthalmic solution, Place 1 drop into both eyes 2 (two) times daily. , Disp: , Rfl:  clopidogrel (PLAVIX) 75 MG tablet, Take 1 tablet (75 mg total) by mouth daily with breakfast., Disp: 30 tablet, Rfl: 0;  insulin glargine (LANTUS) 100 UNIT/ML injection, Inject 10 Units into the skin at bedtime., Disp: , Rfl: ;   insulin lispro (HUMALOG) 100 UNIT/ML injection, Inject 10-15 Units into the skin 2 (two) times daily with a meal. Takes 15 units in the morning with breakfast and 10 units with supper, Disp: , Rfl:  LUMIGAN 0.01 % SOLN, Place 1 drop into both  eyes at bedtime. , Disp: , Rfl: ;   olmesartan (BENICAR) 20 MG tablet, Take 1 tablet (20 mg total) by mouth daily., Disp: 30 tablet, Rfl: 0;  phenylephrine-shark liver oil-mineral oil-petrolatum (PREPARATION H) 0.25-3-14-71.9 % rectal ointment, Place 1 application rectally every 4 (four) hours as needed for hemorrhoids. For hemorrhoids, Disp: , Rfl:  Tamsulosin HCl (FLOMAX) 0.4 MG CAPS, Take 0.4 mg by mouth 2 (two) times daily. , Disp: , Rfl:   ROS: History obtained from the patient  General ROS: negative for - chills, fatigue, fever, night sweats, weight gain or weight loss Psychological ROS: negative for - behavioral disorder, hallucinations, memory difficulties, mood swings or suicidal ideation Ophthalmic ROS: negative for - blurry vision, double vision, eye pain or loss of vision ENT ROS: negative for - epistaxis, nasal discharge, oral lesions, sore throat, tinnitus or vertigo Allergy and Immunology ROS: negative for - hives or itchy/watery eyes Hematological and Lymphatic ROS: negative for - bleeding problems, bruising or swollen lymph nodes Endocrine ROS: negative for - galactorrhea, hair pattern changes, polydipsia/polyuria or temperature intolerance Respiratory ROS: negative for - cough, hemoptysis, shortness of breath or wheezing Cardiovascular ROS: negative for - chest pain, dyspnea on exertion, edema or irregular heartbeat Gastrointestinal ROS: negative for - abdominal pain, diarrhea, hematemesis, nausea/vomiting or stool incontinence Genito-Urinary ROS: negative for - dysuria, hematuria, incontinence or urinary frequency/urgency Musculoskeletal ROS: negative for - joint swelling or muscular weakness Neurological ROS: as noted in HPI Dermatological ROS: negative for rash and skin lesion changes  Physical Examination: Blood pressure 158/75, pulse 67, temperature 97.7 F (36.5 C), temperature source Oral, resp. rate 15, SpO2 99.00%.  Neurologic Examination: Mental Status: Alert,  oriented, thought content appropriate.  Speech fluent with short answers.  When response is spontaneous or requires more speech patient eventually develops nonsensical language.  Able to follow simple commands but unable to perform 3 step commands. Cranial Nerves: II: Discs flat bilaterally; Visual fields grossly normal, pupils pinpoint III,IV, VI: ptosis not present, extra-ocular motions intact bilaterally V,VII: decreased left NLF, facial light touch sensation normal bilaterally VIII: hearing normal bilaterally IX,X: gag reflex present XI: bilateral shoulder shrug XII: midline tongue extension Motor: Right : Upper extremity   5/5    Left:     Upper extremity   5/5  Lower extremity   5/5     Lower extremity   5/5 Tone and bulk:normal tone throughout; no atrophy noted Sensory: Pinprick and light touch intact throughout, bilaterally Deep Tendon Reflexes: 2+ throughout with absent AJ's bilaterally Plantars: Right: downgoing   Left: upgoing Cerebellar: Normal finger-to-nose.  Unable to  follow directions enough to perform heel-to-shin test Gait: Unable to test CV: pulses palpable throughout   Laboratory Studies:  Basic Metabolic Panel:  Recent Labs Lab 06/20/12 1828  NA 141  K 3.6  CL 105  CO2 27  GLUCOSE 174*  BUN 25*  CREATININE 1.36*  CALCIUM 9.3    Liver Function Tests:  Recent Labs Lab 06/20/12 1828  AST 12  ALT 8  ALKPHOS 59  BILITOT 0.3  PROT 6.4  ALBUMIN 3.4*   No results found for this basename: LIPASE, AMYLASE,  in the last 168 hours No results found for this basename: AMMONIA,  in the last 168 hours  CBC:  Recent Labs Lab 06/20/12 1828  WBC 6.8  HGB 11.5*  HCT 33.0*  MCV 85.5  PLT 135*    Cardiac Enzymes:  Recent Labs Lab 06/20/12 1914  TROPONINI <0.30    BNP: No components found with this basename: POCBNP,   CBG:  Recent Labs Lab 06/20/12 1903  GLUCAP 163*    Microbiology: Results for orders placed during the hospital  encounter of 10/20/11  URINE CULTURE     Status: None   Collection Time    10/20/11 12:38 PM      Result Value Range Status   Specimen Description URINE, CLEAN CATCH   Final   Special Requests ADDED ON 10/20/11 AT 1406   Final   Culture  Setup Time 10/20/2011 19:29   Final   Colony Count >=100,000 COLONIES/ML   Final   Culture ESCHERICHIA COLI   Final   Report Status 10/23/2011 FINAL   Final   Organism ID, Bacteria ESCHERICHIA COLI   Final    Coagulation Studies:  Recent Labs  06/20/12 1828  LABPROT 14.0  INR 1.09    Urinalysis:  Recent Labs Lab 06/20/12 1939  COLORURINE YELLOW  LABSPEC 1.022  PHURINE 5.5  GLUCOSEU NEGATIVE  HGBUR NEGATIVE  BILIRUBINUR NEGATIVE  KETONESUR 15*  PROTEINUR 100*  UROBILINOGEN 1.0  NITRITE NEGATIVE  LEUKOCYTESUR TRACE*    Lipid Panel:    Component Value Date/Time   CHOL 107 10/22/2011 0645   TRIG 125 10/22/2011 0645   HDL 25* 10/22/2011 0645   CHOLHDL 4.3 10/22/2011 0645   VLDL 25 10/22/2011 0645   LDLCALC 57 10/22/2011 0645    HgbA1C:  No results found for this basename: HGBA1C    Urine Drug Screen:   No results found for this basename: labopia, cocainscrnur, labbenz, amphetmu, thcu, labbarb    Alcohol Level: No results found for this basename: ETH,  in the last 168 hours   Imaging: Dg Chest 2 View  06/20/2012  *RADIOLOGY REPORT*  Clinical Data: Chest pain.  CHEST - 2 VIEW  Comparison: 10/20/2011.  Findings: Normal sized heart.  Clear lungs.  Thoracic spine degenerative changes, including changes of DISH.  Stable calcified granuloma overlying the medial right lung base.  IMPRESSION: No acute abnormality.   Original Report Authenticated By: Beckie Salts, M.D.    Ct Head Wo Contrast  06/20/2012  *RADIOLOGY REPORT*  Clinical Data: Progressive confusion, altered mental status, new since 06/19/2012  CT HEAD WITHOUT CONTRAST  Technique:  Contiguous axial images were obtained from the base of the skull through the vertex without  contrast.  Comparison: 10/20/2011  Findings: Generalized atrophy. Normal ventricular morphology. No midline shift or mass effect. Small vessel chronic ischemic changes of deep cerebral white matter. Old left periventricular white matter infarct. No intracranial hemorrhage, mass lesion or evidence of acute infarction. No extra-axial fluid  collections. Bones and sinuses unremarkable.  IMPRESSION: Atrophy with small vessel chronic ischemic changes of deep cerebral white matter. Old left parietal periventricular white matter infarct. No acute intracranial abnormalities.   Original Report Authenticated By: Ulyses Southward, M.D.     Assessment: 79 y.o. male with new onset speech deficits.  Symptoms prolonged and lasting longer than they have in the past when felt to be TIA's.  Can not rule out the possibility of a small infarct.  Patient on Plavix prior to admission.  Further work up recommended.  Recent carotid doppler performed in January.  Last echo in July of 2013.    Stroke Risk Factors - diabetes mellitus and hypertension  Plan: 1. HgbA1c, fasting lipid panel 2. MRI, MRA  of the brain without contrast 3. PT consult, OT consult, Speech consult 4. Echocardiogram 5. Prophylactic therapy-Antiplatelet med: Plavix - dose 75mg  daily to be continued 6. Risk factor modification 7. Telemetry monitoring 8. Frequent neuro checks  Case discussed with Dr. Carver Fila, MD [facility] [phone] 06/20/2012, 8:56 PM

## 2012-06-20 NOTE — ED Provider Notes (Addendum)
History     CSN: [id number]  Arrival date & time 06/20/12  1719   First MD Initiated Contact with Patient 06/20/12 1837      Chief Complaint  Patient presents with  . Altered Mental Status    Last seen normal 3/27    (Consider location/radiation/quality/duration/timing/severity/associated sxs/prior treatment) HPI Comments: LEVEL 5 CAVEAT- DEMENTIA. Pt with hx of IDDM, TIA x 3, s/p carotid endarterectomy in 2013, glaucoma comes in with cc of AMS> Pt livesat a nursing home, and per family, they received a call y'day from the nursing home stating that patient was slightly sluggish with his gait. No recent infections. Pt has no complains.  Patient is a 79 y.o. male presenting with altered mental status. The history is provided by the patient, medical records and a relative.  Altered Mental Status    Past Medical History  Diagnosis Date  . Dizziness   . Macular degeneration   . History of TIAs   . Carotid artery occlusion   . Glaucoma   . Stroke     TIA  . Mitral valvular disorder   . Hypertension     takes Amlodipine and Benicar daily  . Heart murmur   . Hoarseness     states pretty much all the time  . Diabetes mellitus     takes Glimepiride daily  . GERD (gastroesophageal reflux disease)     doesn't require meds  . Constipation     occasionally Mag Citrate  . Urinary frequency   . Cancer     prostate;takes Flomax daily  . Nocturia   . Depression     but doesn't require meds  . Anxiety     doesn't take any meds  . Short-term memory loss   . Long-term memory loss   . Aortic insufficiency     mild-moderate AR by echo 07/2011 (Dr. Rennis Golden)  . Acute kidney failure, unspecified   . Urinary tract infection, site not specified   . Orthostatic hypotension   . Dementia   . Other symptoms involving cardiovascular system   . Unspecified transient cerebral ischemia   . Unspecified vitamin D deficiency   . Malignant neoplasm of prostate   . Unspecified glaucoma(365.9)    . Senile cataract, unspecified   . Unspecified essential hypertension   . Other abnormal blood chemistry   . Proteinuria     Past Surgical History  Procedure Laterality Date  . Retinal detachment surgery    . Cataract extraction    . Endarterectomy  09/12/2011    Procedure: ENDARTERECTOMY CAROTID;  Surgeon: Sherren Kerns, MD;  Location: [facility] OR;  Service: Vascular;  Laterality: Right;  right carotid artery endarterectomy  with dacron patch angioplasty  . Carotid endarterectomy  09/12/11    RIGHT  cea    Family History  Problem Relation Age of Onset  . Cancer Brother   . Crohn's disease Son     History  Substance Use Topics  . Smoking status: Never Smoker   . Smokeless tobacco: Never Used  . Alcohol Use: Yes     Comment: occasionally beer 2-3 times/month      Review of Systems  Unable to perform ROS: Dementia  Psychiatric/Behavioral: Positive for altered mental status.    Allergies  Review of patient's allergies indicates no known allergies.  Home Medications   Current Outpatient Rx  Name  Route  Sig  Dispense  Refill  . acetaminophen (TYLENOL) 325 MG tablet   Oral   Take 1  tablet (325 mg total) by mouth every 6 (six) hours as needed (or Fever >/= 101).   30 tablet   0   . amLODipine (NORVASC) 10 MG tablet   Oral   Take 10 mg by mouth daily.           Marland Kitchen BETIMOL 0.5 % ophthalmic solution   Both Eyes   Place 1 drop into both eyes 2 (two) times daily.          . clopidogrel (PLAVIX) 75 MG tablet   Oral   Take 1 tablet (75 mg total) by mouth daily with breakfast.   30 tablet   0   . insulin glargine (LANTUS) 100 UNIT/ML injection   Subcutaneous   Inject 10 Units into the skin at bedtime.         . insulin lispro (HUMALOG) 100 UNIT/ML injection   Subcutaneous   Inject 10-15 Units into the skin 2 (two) times daily with a meal. Takes 15 units in the morning with breakfast and 10 units with supper         . LUMIGAN 0.01 % SOLN   Both Eyes    Place 1 drop into both eyes at bedtime.          Marland Kitchen olmesartan (BENICAR) 20 MG tablet   Oral   Take 1 tablet (20 mg total) by mouth daily.   30 tablet   0   . phenylephrine-shark liver oil-mineral oil-petrolatum (PREPARATION H) 0.25-3-14-71.9 % rectal ointment   Rectal   Place 1 application rectally every 4 (four) hours as needed for hemorrhoids. For hemorrhoids         . Tamsulosin HCl (FLOMAX) 0.4 MG CAPS   Oral   Take 0.4 mg by mouth 2 (two) times daily.            BP 158/75  Pulse 67  Temp(Src) 97.7 F (36.5 C) (Oral)  Resp 15  SpO2 99%  Physical Exam  Nursing note reviewed. Constitutional: He is oriented to person, place, and time. He appears well-developed and well-nourished.  HENT:  Head: Normocephalic and atraumatic.  Eyes: EOM are normal. Pupils are equal, round, and reactive to light.  Neck: Normal range of motion. Neck supple. No JVD present.  Cardiovascular: Normal rate and regular rhythm.   Pulmonary/Chest: Effort normal and breath sounds normal. No respiratory distress. He has no wheezes.  Abdominal: Soft. Bowel sounds are normal. He exhibits no distension. There is no tenderness. There is no rebound and no guarding.  Neurological: He is alert and oriented to person, place, and time. No cranial nerve deficit. Coordination normal.  NIHSS - 0 No objective sensory deficits, Motor strength upper and lower extremity 4+ and equal Normal cerebellar exam - mild dysmetria GAIT IS ATAXIC  Skin: Skin is warm and dry.    ED Course  Procedures (including critical care time)  Labs Reviewed  CBC - Abnormal; Notable for the following:    RBC 3.86 (*)    Hemoglobin 11.5 (*)    HCT 33.0 (*)    Platelets 135 (*)    All other components within normal limits  COMPREHENSIVE METABOLIC PANEL - Abnormal; Notable for the following:    Glucose, Bld 174 (*)    BUN 25 (*)    Creatinine, Ser 1.36 (*)    Albumin 3.4 (*)    GFR calc non Af Amer 45 (*)    GFR calc Af  Amer 52 (*)    All other components within  normal limits  URINALYSIS, ROUTINE W REFLEX MICROSCOPIC - Abnormal; Notable for the following:    Ketones, ur 15 (*)    Protein, ur 100 (*)    Leukocytes, UA TRACE (*)    All other components within normal limits  GLUCOSE, CAPILLARY - Abnormal; Notable for the following:    Glucose-Capillary 163 (*)    All other components within normal limits  URINE MICROSCOPIC-ADD ON - Abnormal; Notable for the following:    Casts GRANULAR CAST (*)    All other components within normal limits  URINE CULTURE  PROTIME-INR  TROPONIN I   Dg Chest 2 View  06/20/2012  *RADIOLOGY REPORT*  Clinical Data: Chest pain.  CHEST - 2 VIEW  Comparison: 10/20/2011.  Findings: Normal sized heart.  Clear lungs.  Thoracic spine degenerative changes, including changes of DISH.  Stable calcified granuloma overlying the medial right lung base.  IMPRESSION: No acute abnormality.   Original Report Authenticated By: Beckie Salts, M.D.      No diagnosis found.    MDM   Date: 06/20/2012  Rate: 73  Rhythm: normal sinus rhythm  QRS Axis: left  Intervals: normal  ST/T Wave abnormalities: nonspecific ST/T changes  Conduction Disutrbances:right bundle branch block  Narrative Interpretation:   Old EKG Reviewed: changes noted  Pt comes in with cc of AMS. Reportedly, he has been "talking jibberish" and slightly confused and ataxic. Our exam is positive for ataxia. He is aox2, slightly confused, but also has demnetia.'  We will get basic labs to ensure there is no underlying infection, or ACS. Or elyte abn. I think given the hx of TIAs - this could be TIa/Stroke - although delerium is other possibility. Will consult Neuro,.  9:19 PM Neuro recommends admission for stroke. Dr. Jodi Mourning also appreciated some speech disturbance.    Derwood Kaplan, MD 06/20/12 2119  Derwood Kaplan, MD 07/05/12 1610

## 2012-06-20 NOTE — H&P (Signed)
History and Physical  Ronald Hunt NWG:[id number] DOB: 05/16/1923 DOA: 06/20/2012  Referring physician: Dr Rhunette Croft PCP: Terald Sleeper, MD   Chief Complaint: Speech difficulty  HPI: Ronald Hunt is an 79 y.o. male with a history of insulin dependent DM, TIA's s/p  CEA who awakened today with difficulty with speech. His son provides much of the history. It seems that yesterday he was at baseline for the most part other than not looking quite right per family when he went to the doctor. He was able to come back and eat normally. Gait was normal and he had his usual conversation with his sone by phone in the evening. This morning his speech was altered. At times it was jibberish. His gait seemed slow as well although he maintains the ability to ambulate. He is at baseline often uncooperative but today has been more easy-going than usual. He has had many of these type episodes in the past but they usually last about 15 minutes before resolving on their own. This particular event has been more protracted.   Review of Systems:  15 point ROS is negative except what is noted above.  Past Medical History  Diagnosis Date  . Dizziness   . Macular degeneration   . History of TIAs   . Carotid artery occlusion   . Glaucoma   . Stroke     TIA  . Mitral valvular disorder   . Hypertension     takes Amlodipine and Benicar daily  . Heart murmur   . Hoarseness     states pretty much all the time  . Diabetes mellitus     takes Glimepiride daily  . GERD (gastroesophageal reflux disease)     doesn't require meds  . Constipation     occasionally Mag Citrate  . Urinary frequency   . Cancer     prostate;takes Flomax daily  . Nocturia   . Depression     but doesn't require meds  . Anxiety     doesn't take any meds  . Short-term memory loss   . Long-term memory loss   . Aortic insufficiency     mild-moderate AR by echo 07/2011 (Dr. Rennis Golden)  . Acute kidney failure, unspecified   . Urinary  tract infection, site not specified   . Orthostatic hypotension   . Dementia   . Other symptoms involving cardiovascular system   . Unspecified transient cerebral ischemia   . Unspecified vitamin D deficiency   . Malignant neoplasm of prostate   . Unspecified glaucoma(365.9)   . Senile cataract, unspecified   . Unspecified essential hypertension   . Other abnormal blood chemistry   . Proteinuria     Past Surgical History  Procedure Laterality Date  . Retinal detachment surgery    . Cataract extraction    . Endarterectomy  09/12/2011    Procedure: ENDARTERECTOMY CAROTID;  Surgeon: Sherren Kerns, MD;  Location: [facility] OR;  Service: Vascular;  Laterality: Right;  right carotid artery endarterectomy  with dacron patch angioplasty  . Carotid endarterectomy  09/12/11    RIGHT  cea    Social History:  reports that he has never smoked. He has never used smokeless tobacco. He reports that  drinks alcohol. He reports that he does not use illicit drugs.  No Known Allergies  Family History  Problem Relation Age of Onset  . Cancer Brother   . Crohn's disease Son      Prior to Admission medications   Medication Sig  Start Date End Date Taking? Authorizing Provider  acetaminophen (TYLENOL) 325 MG tablet Take 1 tablet (325 mg total) by mouth every 6 (six) hours as needed (or Fever >/= 101). 10/22/11 10/21/12 Yes Dawood Elgergawy, MD  amLODipine (NORVASC) 10 MG tablet Take 10 mg by mouth daily.     Yes Historical Provider, MD  BETIMOL 0.5 % ophthalmic solution Place 1 drop into both eyes 2 (two) times daily.  07/21/11  Yes Historical Provider, MD  clopidogrel (PLAVIX) 75 MG tablet Take 1 tablet (75 mg total) by mouth daily with breakfast. 10/22/11 10/21/12 Yes Dawood Elgergawy, MD  insulin glargine (LANTUS) 100 UNIT/ML injection Inject 10 Units into the skin at bedtime.   Yes Historical Provider, MD  insulin lispro (HUMALOG) 100 UNIT/ML injection Inject 10-15 Units into the skin 2 (two) times daily  with a meal. Takes 15 units in the morning with breakfast and 10 units with supper   Yes Historical Provider, MD  LUMIGAN 0.01 % SOLN Place 1 drop into both eyes at bedtime.  11/29/10  Yes Historical Provider, MD  olmesartan (BENICAR) 20 MG tablet Take 1 tablet (20 mg total) by mouth daily. 10/22/11 10/21/12 Yes Dawood Elgergawy, MD  phenylephrine-shark liver oil-mineral oil-petrolatum (PREPARATION H) 0.25-3-14-71.9 % rectal ointment Place 1 application rectally every 4 (four) hours as needed for hemorrhoids. For hemorrhoids   Yes Historical Provider, MD  Tamsulosin HCl (FLOMAX) 0.4 MG CAPS Take 0.4 mg by mouth 2 (two) times daily.  12/23/10  Yes Historical Provider, MD   Physical Exam: Filed Vitals:   06/20/12 1746 06/20/12 1833 06/20/12 1900  BP: 138/84 158/75 161/71  Pulse: 74 67 70  Temp: 98.4 F (36.9 C) 97.7 F (36.5 C)   TempSrc: Oral Oral   Resp: 18 15 14   SpO2: 99% 99% 100%     Wt Readings from Last 3 Encounters:  04/10/12 186 lb 3.2 oz (84.46 kg)  10/22/11 199 lb 9.6 oz (90.538 kg)  10/04/11 194 lb (87.998 kg)    Labs on Admission:  Basic Metabolic Panel:  Recent Labs Lab 06/20/12 1828  NA 141  K 3.6  CL 105  CO2 27  GLUCOSE 174*  BUN 25*  CREATININE 1.36*  CALCIUM 9.3    Liver Function Tests:  Recent Labs Lab 06/20/12 1828  AST 12  ALT 8  ALKPHOS 59  BILITOT 0.3  PROT 6.4  ALBUMIN 3.4*   CBC:  Recent Labs Lab 06/20/12 1828  WBC 6.8  HGB 11.5*  HCT 33.0*  MCV 85.5  PLT 135*    Cardiac Enzymes:  Recent Labs Lab 06/20/12 1914  TROPONINI <0.30    CBG:  Recent Labs Lab 06/20/12 1903  GLUCAP 163*     Radiological Exams on Admission: Dg Chest 2 View  06/20/2012  *RADIOLOGY REPORT*  Clinical Data: Chest pain.  CHEST - 2 VIEW  Comparison: 10/20/2011.  Findings: Normal sized heart.  Clear lungs.  Thoracic spine degenerative changes, including changes of DISH.  Stable calcified granuloma overlying the medial right lung base.  IMPRESSION:  No acute abnormality.   Original Report Authenticated By: Beckie Salts, M.D.    Ct Head Wo Contrast  06/20/2012  *RADIOLOGY REPORT*  Clinical Data: Progressive confusion, altered mental status, new since 06/19/2012  CT HEAD WITHOUT CONTRAST  Technique:  Contiguous axial images were obtained from the base of the skull through the vertex without contrast.  Comparison: 10/20/2011  Findings: Generalized atrophy. Normal ventricular morphology. No midline shift or mass effect. Small vessel chronic  ischemic changes of deep cerebral white matter. Old left periventricular white matter infarct. No intracranial hemorrhage, mass lesion or evidence of acute infarction. No extra-axial fluid collections. Bones and sinuses unremarkable.  IMPRESSION: Atrophy with small vessel chronic ischemic changes of deep cerebral white matter. Old left parietal periventricular white matter infarct. No acute intracranial abnormalities.   Original Report Authenticated By: Ulyses Southward, M.D.     EKG: Independently reviewed.    Principal Problem:   CVA (cerebral infarction) Active Problems:   Carotid stenosis   HTN (hypertension)   DM (diabetes mellitus)   GERD (gastroesophageal reflux disease)   Assessment/Plan  CVA: Full neurological workup as per neurology including MRI/MRA and 2d echo. -to be continued on plavix for secondary prevention -PT/OT consult -Telemetry -neurochecks -speech consult -Risk stratification with lipid panel  Carotid stenosis: Recent CEA  HTN; Continue home meds.  DM: SSI, Hba1c  GERD: Continue home meds.  Code Status: full Family Communication: updated at bedside Disposition Plan/Anticipated LOS: guarded  Time spent: 70 minutes  Lars Mage, MD  [facility] Team 5  If 7PM-7AM, please contact night-coverage at [email or web address], password [facility] 06/20/2012, 9:37 PM

## 2012-06-20 NOTE — ED Notes (Signed)
Pt with hx of dementia and tia to ED from [facility] for AMS.  Son states that pt was shuffling last night while at MD, but otherwise was normal.  RN called to confirm with [facility] staff and they stated that pt was LSN last night and awoke this am confused and has become increasingly confused.  Family was called at 1637 pm.  Staff states that pt also c/o L neck pain (H/O carotid occlusion).  Pt alert to name only and is aphasic.

## 2012-06-21 ENCOUNTER — Inpatient Hospital Stay (HOSPITAL_COMMUNITY): Payer: Medicare Other

## 2012-06-21 DIAGNOSIS — I10 Essential (primary) hypertension: Secondary | ICD-10-CM

## 2012-06-21 DIAGNOSIS — E119 Type 2 diabetes mellitus without complications: Secondary | ICD-10-CM

## 2012-06-21 DIAGNOSIS — K219 Gastro-esophageal reflux disease without esophagitis: Secondary | ICD-10-CM

## 2012-06-21 LAB — URINALYSIS, ROUTINE W REFLEX MICROSCOPIC
Bilirubin Urine: NEGATIVE
Glucose, UA: 100 mg/dL — AB
Hgb urine dipstick: NEGATIVE
Protein, ur: 30 mg/dL — AB
Urobilinogen, UA: 1 mg/dL (ref 0.0–1.0)

## 2012-06-21 LAB — GLUCOSE, CAPILLARY
Glucose-Capillary: 130 mg/dL — ABNORMAL HIGH (ref 70–99)
Glucose-Capillary: 210 mg/dL — ABNORMAL HIGH (ref 70–99)
Glucose-Capillary: 148 mg/dL — ABNORMAL HIGH (ref 70–99)

## 2012-06-21 LAB — URINE MICROSCOPIC-ADD ON

## 2012-06-21 LAB — LIPID PANEL
HDL: 35 mg/dL — ABNORMAL LOW (ref >39)
LDL Cholesterol: 93 mg/dL (ref 0–99)
Triglycerides: 127 mg/dL (ref <150)
VLDL: 25 mg/dL (ref 0–40)

## 2012-06-21 LAB — MRSA PCR SCREENING: MRSA by PCR: POSITIVE — AB

## 2012-06-21 MED ORDER — QUETIAPINE 12.5 MG HALF TABLET
12.5000 mg | ORAL_TABLET | Freq: Two times a day (BID) | ORAL | Status: DC | PRN
  Administered 2012-06-21: 12.5 mg via ORAL
  Filled 2012-06-21 (×2): qty 1

## 2012-06-21 MED ORDER — CHLORHEXIDINE GLUCONATE CLOTH 2 % EX PADS
6.0000 | MEDICATED_PAD | Freq: Every day | CUTANEOUS | Status: AC
  Administered 2012-06-21 – 2012-06-25 (×5): 6 via TOPICAL

## 2012-06-21 MED ORDER — MUPIROCIN 2 % EX OINT
1.0000 "application " | TOPICAL_OINTMENT | Freq: Two times a day (BID) | CUTANEOUS | Status: AC
  Administered 2012-06-21 – 2012-06-25 (×9): 1 via NASAL
  Filled 2012-06-21 (×2): qty 22

## 2012-06-21 NOTE — Progress Notes (Signed)
Pt. Had episode of agitation and confusion. Pt. Wanted to "leave and go to the hospital." Attempted to kick this RN. This RN attempted to call the pt.'s son but he was not available. Dr. Elvera Lennox notified and said he will come see patient. Will continue to monitor.

## 2012-06-21 NOTE — Evaluation (Signed)
Physical Therapy Evaluation Patient Details Name: Ronald Hunt MRN: [id number] DOB: 17-Apr-1923 Today's Date: 06/21/2012 Time: 6295-2841 PT Time Calculation (min): 18 min  PT Assessment / Plan / Recommendation Clinical Impression  Pt is a 79 yo male admitted for slurred speech and weakness. Pt reports residing in independent living and didn't require use of AD for safe ambulation. Pt now with balance impairment, altered cognition and is at increased falls risk. Pt to benefit from ST-SNF placement to achieve mod I function for safe transition back to independent living.    PT Assessment  Patient needs continued PT services    Follow Up Recommendations  SNF;Supervision/Assistance - 24 hour    Does the patient have the potential to tolerate intense rehabilitation      Barriers to Discharge Decreased caregiver support      Equipment Recommendations   (TBD)    Recommendations for Other Services     Frequency Min 4X/week    Precautions / Restrictions Precautions Precautions: Fall Precaution Comments: pt with noted vision impairment. minimal vision out of R eye Restrictions Weight Bearing Restrictions: No   Pertinent Vitals/Pain Pt denies pain      Mobility  Bed Mobility Bed Mobility: Supine to Sit Supine to Sit: 5: Supervision;HOB flat Details for Bed Mobility Assistance: increased time Transfers Transfers: Sit to Stand;Stand to Sit Sit to Stand: 4: Min assist;With upper extremity assist;From bed Stand to Sit: 4: Min assist;With upper extremity assist;To chair/3-in-1;With armrests Details for Transfer Assistance: v/c's for hand placement, unsteady upon intial stand. v/c's for safe foot placement, pt reaching for something to hold onto once stand achieved Ambulation/Gait Ambulation/Gait Assistance: 4: Min assist Ambulation Distance (Feet): 100 Feet Assistive device: 1 person hand held assist Ambulation/Gait Assistance Details: attempted using RW however pt unsafe due to  limited vision and increased tripping over RW. Pt with vision deficits causing pt to think there was a step when the tile color changed. pt unsteady wihtout use of RW however pt unsafe with RW as well. Pt safest when amb while holding PT's hand.. Gait Pattern: Step-through pattern;Shuffle;Trunk flexed;Narrow base of support Gait velocity: slow Stairs: No Modified Rankin (Stroke Patients Only) Pre-Morbid Rankin Score: No significant disability Modified Rankin: Moderately severe disability    Exercises     PT Diagnosis: Difficulty walking  PT Problem List: Decreased strength;Decreased balance;Decreased mobility;Decreased coordination PT Treatment Interventions: DME instruction;Gait training;Stair training;Functional mobility training;Therapeutic activities;Therapeutic exercise;Balance training   PT Goals Acute Rehab PT Goals PT Goal Formulation: With patient Time For Goal Achievement: 06/28/12 Potential to Achieve Goals: Good Pt will go Supine/Side to Sit: with supervision;with HOB 0 degrees PT Goal: Supine/Side to Sit - Progress: Goal set today Pt will go Sit to Supine/Side: with supervision PT Goal: Sit to Supine/Side - Progress: Goal set today Pt will go Sit to Stand: with supervision;with upper extremity assist PT Goal: Sit to Stand - Progress: Goal set today Pt will Ambulate: 51 - 150 feet;with supervision;with least restrictive assistive device PT Goal: Ambulate - Progress: Goal set today  Visit Information  Last PT Received On: 06/21/12 Assistance Needed: +1    Subjective Data  Subjective: Pt received supine in bed agreeable to PT.   Prior Functioning  Home Living Lives With:  (? history - left message with son for clarification) Type of Home: Independent living facility Home Layout: One level Bathroom Shower/Tub: Engineer, manufacturing systems: Standard Prior Function Level of Independence: Independent Able to Take Stairs?: Yes Driving: No Vocation:  Retired Comments: pt reprots  making his own meals, occasionally goes to dinning room Communication Communication: No difficulties Dominant Hand: Right    Cognition  Cognition Overall Cognitive Status: Impaired Area of Impairment: Attention;Memory;Safety/judgement Arousal/Alertness: Awake/alert Orientation Level: Disoriented to;Place;Time;Situation Behavior During Session: WFL for tasks performed Current Attention Level: Sustained Memory Deficits: unable to recall date/place despite re-orientation Safety/Judgement: Decreased awareness of need for assistance    Extremity/Trunk Assessment Right Upper Extremity Assessment RUE ROM/Strength/Tone: Within functional levels RUE Sensation: WFL - Light Touch RUE Coordination: WFL - gross/fine motor Left Upper Extremity Assessment LUE ROM/Strength/Tone: Within functional levels LUE Sensation: WFL - Light Touch LUE Coordination: WFL - gross/fine motor Right Lower Extremity Assessment RLE ROM/Strength/Tone: Deficits RLE ROM/Strength/Tone Deficits: generalized weakness 4/5 RLE Sensation: WFL - Light Touch RLE Coordination: WFL - gross/fine motor Left Lower Extremity Assessment LLE ROM/Strength/Tone: Deficits LLE ROM/Strength/Tone Deficits: generalized weakness 4/5 grossly LLE Sensation: WFL - Light Touch LLE Coordination: WFL - gross/fine motor Trunk Assessment Trunk Assessment: Kyphotic   Balance    End of Session PT - End of Session Equipment Utilized During Treatment: Gait belt Activity Tolerance: Patient tolerated treatment well Patient left: in chair;with call bell/phone within reach Nurse Communication: Mobility status  GP     Marcene Brawn 06/21/2012, 2:28 PM   Lewis Shock, PT, DPT Pager #: [phone] Office #: [phone]

## 2012-06-21 NOTE — Progress Notes (Signed)
SLP Cancellation Note  Patient Details Name: Ronald Hunt MRN: [id number] DOB: 09-30-1923   Cancelled treatment:         Pt. Out of room for testing.  Will continue efforts.   Breck Coons [place].[employer] [phone]  06/21/2012

## 2012-06-21 NOTE — Progress Notes (Signed)
[facility] PROGRESS NOTE  GJON LETARTE QMV:[id number] DOB: 1923/09/10 DOA: 06/20/2012 PCP: Terald Sleeper, MD  Brief Narrative: Ronald Hunt is an 79 y.o. male with a history of insulin dependent DM, TIA's s/p CEA who awakened today with difficulty with speech. His son provides much of the history. It seems that yesterday he was at baseline for the most part other than not looking quite right per family when he went to the doctor. He was able to come back and eat normally. Gait was normal and he had his usual conversation with his sone by phone in the evening. This morning his speech was altered. At times it was jibberish. His gait seemed slow as well although he maintains the ability to ambulate. He is at baseline often uncooperative but today has been more easy-going than usual. He has had many of these type episodes in the past but they usually last about 15 minutes before resolving on their own. This particular event has been more protracted.  Assessment/Plan:  CVA:  Full neurological workup as per neurology including MRI/MRA and 2d echo.  -to be continued on plavix for secondary prevention  -PT/OT consult  -Telemetry  -neurochecks  -speech consult   Carotid stenosis:  Recent CEA   HTN;  Continue home meds.   DM:  SSI, Hba1c   GERD:  Continue home meds.  Code Status: Presumed Full Family Communication: none  Disposition Plan: pending CVA evaluation  Consultants:  Neurology  Procedures:  none  Antibiotics:  none  HPI/Subjective: Feels well this morning, denies speech problems but does sound somewhat slurred  Objective: Filed Vitals:   06/21/12 0001 06/21/12 0100 06/21/12 0350 06/21/12 0604  BP: 183/85 152/77 160/73 175/76  Pulse: 70 64 64 59  Temp: 98 F (36.7 C) 97.4 F (36.3 C) 97.4 F (36.3 C) 97.7 F (36.5 C)  TempSrc: Oral Oral Oral Oral  Resp: 18 18 20 20   Height:      Weight:      SpO2: 96% 99% 96% 98%    Intake/Output  Summary (Last 24 hours) at 06/21/12 1031 Last data filed at 06/21/12 0700  Gross per 24 hour  Intake      0 ml  Output   2025 ml  Net  -2025 ml   Filed Weights   06/20/12 2211  Weight: 76.6 kg (168 lb 14 oz)    Exam:   General:  NAD  Cardiovascular: regular rate and rhythm, without MRG  Respiratory: good air movement, clear to auscultation throughout, no wheezing, ronchi or rales  Abdomen: soft, not tender to palpation, positive bowel sounds  MSK: no peripheral edema  Neuro:  MS 5/5 in all 4  Data Reviewed: Basic Metabolic Panel:  Recent Labs Lab 06/20/12 1828  NA 141  K 3.6  CL 105  CO2 27  GLUCOSE 174*  BUN 25*  CREATININE 1.36*  CALCIUM 9.3   Liver Function Tests:  Recent Labs Lab 06/20/12 1828  AST 12  ALT 8  ALKPHOS 59  BILITOT 0.3  PROT 6.4  ALBUMIN 3.4*   CBC:  Recent Labs Lab 06/20/12 1828  WBC 6.8  HGB 11.5*  HCT 33.0*  MCV 85.5  PLT 135*   Cardiac Enzymes:  Recent Labs Lab 06/20/12 1914  TROPONINI <0.30   CBG:  Recent Labs Lab 06/20/12 1903 06/20/12 2322 06/21/12 0704  GLUCAP 163* 59* 130*    Recent Results (from the past 240 hour(s))  MRSA PCR SCREENING     Status:  Abnormal   Collection Time    06/21/12  6:03 AM      Result Value Range Status   MRSA by PCR POSITIVE (*) NEGATIVE Final   Comment:            The GeneXpert MRSA Assay (FDA     approved for NASAL specimens     only), is one component of a     comprehensive MRSA colonization     surveillance program. It is not     intended to diagnose MRSA     infection nor to guide or     monitor treatment for     MRSA infections.     RESULT CALLED TO, READ BACK BY AND VERIFIED WITH:     RIMMER,A RN 06/21/12 0855 WOOTEN,K     Studies: Dg Chest 2 View  06/20/2012  *RADIOLOGY REPORT*  Clinical Data: Chest pain.  CHEST - 2 VIEW  Comparison: 10/20/2011.  Findings: Normal sized heart.  Clear lungs.  Thoracic spine degenerative changes, including changes of DISH.   Stable calcified granuloma overlying the medial right lung base.  IMPRESSION: No acute abnormality.   Original Report Authenticated By: Beckie Salts, M.D.    Ct Head Wo Contrast  06/20/2012  *RADIOLOGY REPORT*  Clinical Data: Progressive confusion, altered mental status, new since 06/19/2012  CT HEAD WITHOUT CONTRAST  Technique:  Contiguous axial images were obtained from the base of the skull through the vertex without contrast.  Comparison: 10/20/2011  Findings: Generalized atrophy. Normal ventricular morphology. No midline shift or mass effect. Small vessel chronic ischemic changes of deep cerebral white matter. Old left periventricular white matter infarct. No intracranial hemorrhage, mass lesion or evidence of acute infarction. No extra-axial fluid collections. Bones and sinuses unremarkable.  IMPRESSION: Atrophy with small vessel chronic ischemic changes of deep cerebral white matter. Old left parietal periventricular white matter infarct. No acute intracranial abnormalities.   Original Report Authenticated By: Ulyses Southward, M.D.     Scheduled Meds: . amLODipine  10 mg Oral Daily  . bimatoprost  1 drop Both Eyes QHS  . Chlorhexidine Gluconate Cloth  6 each Topical Q0600  . clopidogrel  75 mg Oral Q breakfast  . enoxaparin (LOVENOX) injection  40 mg Subcutaneous Q24H  . insulin aspart  0-9 Units Subcutaneous TID WC  . irbesartan  150 mg Oral Daily  . mupirocin ointment  1 application Nasal BID  . tamsulosin  0.4 mg Oral BID  . timolol  1 drop Both Eyes BID   Continuous Infusions:   Principal Problem:   CVA (cerebral infarction) Active Problems:   Carotid stenosis   HTN (hypertension)   DM (diabetes mellitus)   GERD (gastroesophageal reflux disease)  [name], MD [facility] Pager [phone]. If 7 PM - 7 AM, please contact night-coverage at [email or web address], password [facility] 06/21/2012, 10:31 AM  LOS: 1 day

## 2012-06-22 LAB — GLUCOSE, CAPILLARY
Glucose-Capillary: 208 mg/dL — ABNORMAL HIGH (ref 70–99)
Glucose-Capillary: 209 mg/dL — ABNORMAL HIGH (ref 70–99)

## 2012-06-22 LAB — BASIC METABOLIC PANEL
BUN: 22 mg/dL (ref 6–23)
Chloride: 102 mEq/L (ref 96–112)
Creatinine, Ser: 1.21 mg/dL (ref 0.50–1.35)
GFR calc Af Amer: 60 mL/min — ABNORMAL LOW (ref >90)
GFR calc non Af Amer: 52 mL/min — ABNORMAL LOW (ref >90)
Potassium: 3.3 mEq/L — ABNORMAL LOW (ref 3.5–5.1)

## 2012-06-22 LAB — URINE CULTURE

## 2012-06-22 LAB — URINALYSIS, ROUTINE W REFLEX MICROSCOPIC
Bilirubin Urine: NEGATIVE
Glucose, UA: NEGATIVE mg/dL
Hgb urine dipstick: NEGATIVE
Ketones, ur: NEGATIVE mg/dL
pH: 6.5 (ref 5.0–8.0)

## 2012-06-22 MED ORDER — HALOPERIDOL 1 MG PO TABS
1.0000 mg | ORAL_TABLET | Freq: Once | ORAL | Status: AC
  Administered 2012-06-22: 1 mg via ORAL
  Filled 2012-06-22: qty 1

## 2012-06-22 MED ORDER — POTASSIUM CHLORIDE CRYS ER 20 MEQ PO TBCR
30.0000 meq | EXTENDED_RELEASE_TABLET | Freq: Once | ORAL | Status: AC
  Administered 2012-06-22: 30 meq via ORAL
  Filled 2012-06-22: qty 1

## 2012-06-22 NOTE — Progress Notes (Signed)
Bilateral:  No evidence of hemodynamically significant internal carotid artery stenosis.   Vertebral artery flow is antegrade.     

## 2012-06-22 NOTE — Progress Notes (Signed)
[facility] PROGRESS NOTE  Ronald Hunt ZOX:[id number] DOB: 1923/06/24 DOA: 06/20/2012 PCP: Terald Sleeper, MD  Brief Narrative: Ronald Hunt is an 79 y.o. male with a history of insulin dependent DM, TIA's s/p CEA who awakened today with difficulty with speech. His son provides much of the history. It seems that yesterday he was at baseline for the most part other than not looking quite right per family when he went to the doctor. He was able to come back and eat normally. Gait was normal and he had his usual conversation with his sone by phone in the evening. This morning his speech was altered. At times it was jibberish. His gait seemed slow as well although he maintains the ability to ambulate. He is at baseline often uncooperative but today has been more easy-going than usual. He has had many of these type episodes in the past but they usually last about 15 minutes before resolving on their own. This particular event has been more protracted.  Assessment/Plan:  Slurred speech Full neurological workup as per neurology including MRI/MRA and 2d echo.  - to be continued on plavix for secondary prevention  - PT/OT consult  - Telemetry  - neurochecks  - speech consult  - neurology following, appreciate their input.   Dementia - patient has been more and more cognitively impaired over the past few months with episodes of hallucinations  - while they feel like along with this episode he has been more confused than normal, but they do feel that he has been overall declining over the last few months.  Carotid stenosis:  Recent CEA   HTN;  Continue home meds.   DM:  SSI, Hba1c   GERD:  Continue home meds.  Code Status: Full Family Communication: none  Disposition Plan: SNF 1-2 days  Consultants:  Neurology  Procedures:  none  Antibiotics:  none  HPI/Subjective: - pleasantly confused   Objective: Filed Vitals:   06/21/12 0604 06/21/12 1145 06/21/12  2200 06/22/12 0200  BP: 175/76 177/76 181/83 141/70  Pulse: 59 64 72 64  Temp: 97.7 F (36.5 C) 98.1 F (36.7 C) 98.5 F (36.9 C) 98.3 F (36.8 C)  TempSrc: Oral Oral Oral Oral  Resp: 20 20 20 20   Height:      Weight:      SpO2: 98% 99% 99% 98%   No intake or output data in the 24 hours ending 06/22/12 0743 Filed Weights   06/20/12 2211  Weight: 76.6 kg (168 lb 14 oz)   Exam:  General:  NAD, pleasantly confused  Cardiovascular: regular rate and rhythm, without MRG  Respiratory: good air movement, clear to auscultation throughout, no wheezing, ronchi or rales  Abdomen: soft, not tender to palpation, positive bowel sounds  MSK: no peripheral edema  Neuro:  MS 5/5 in all 4  Data Reviewed: Basic Metabolic Panel:  Recent Labs Lab 06/20/12 1828 06/22/12 0526  NA 141 139  K 3.6 3.3*  CL 105 102  CO2 27 27  GLUCOSE 174* 180*  BUN 25* 22  CREATININE 1.36* 1.21  CALCIUM 9.3 9.1   Liver Function Tests:  Recent Labs Lab 06/20/12 1828  AST 12  ALT 8  ALKPHOS 59  BILITOT 0.3  PROT 6.4  ALBUMIN 3.4*   CBC:  Recent Labs Lab 06/20/12 1828  WBC 6.8  HGB 11.5*  HCT 33.0*  MCV 85.5  PLT 135*   Cardiac Enzymes:  Recent Labs Lab 06/20/12 1914  TROPONINI <0.30  CBG:  Recent Labs Lab 06/21/12 0704 06/21/12 1213 06/21/12 1701 06/21/12 2109 06/22/12 0649  GLUCAP 130* 210* 148* 232* 169*    Recent Results (from the past 240 hour(s))  MRSA PCR SCREENING     Status: Abnormal   Collection Time    06/21/12  6:03 AM      Result Value Range Status   MRSA by PCR POSITIVE (*) NEGATIVE Final   Comment:            The GeneXpert MRSA Assay (FDA     approved for NASAL specimens     only), is one component of a     comprehensive MRSA colonization     surveillance program. It is not     intended to diagnose MRSA     infection nor to guide or     monitor treatment for     MRSA infections.     RESULT CALLED TO, READ BACK BY AND VERIFIED WITH:      RIMMER,A RN 06/21/12 0855 WOOTEN,K    Studies: Dg Chest 2 View  06/20/2012  *RADIOLOGY REPORT*  Clinical Data: Chest pain.  CHEST - 2 VIEW  Comparison: 10/20/2011.  Findings: Normal sized heart.  Clear lungs.  Thoracic spine degenerative changes, including changes of DISH.  Stable calcified granuloma overlying the medial right lung base.  IMPRESSION: No acute abnormality.   Original Report Authenticated By: Beckie Salts, M.D.    Ct Head Wo Contrast  06/20/2012  *RADIOLOGY REPORT*  Clinical Data: Progressive confusion, altered mental status, new since 06/19/2012  CT HEAD WITHOUT CONTRAST  Technique:  Contiguous axial images were obtained from the base of the skull through the vertex without contrast.  Comparison: 10/20/2011  Findings: Generalized atrophy. Normal ventricular morphology. No midline shift or mass effect. Small vessel chronic ischemic changes of deep cerebral white matter. Old left periventricular white matter infarct. No intracranial hemorrhage, mass lesion or evidence of acute infarction. No extra-axial fluid collections. Bones and sinuses unremarkable.  IMPRESSION: Atrophy with small vessel chronic ischemic changes of deep cerebral white matter. Old left parietal periventricular white matter infarct. No acute intracranial abnormalities.   Original Report Authenticated By: Ulyses Southward, M.D.    Mr Brain Wo Contrast  06/21/2012  *RADIOLOGY REPORT*  Clinical Data:  Speech difficulty.  The stroke.  MRI HEAD WITHOUT CONTRAST MRA HEAD WITHOUT CONTRAST  Technique:  Multiplanar, multiecho pulse sequences of the brain and surrounding structures were obtained without intravenous contrast. Angiographic images of the head were obtained using MRA technique without contrast.  Comparison:  CT head without contrast 06/20/2012.  MRI HEAD  Findings:  The diffusion weighted sequence demonstrates no evidence for acute or subacute infarction.  Advanced generalized atrophy and white matter disease is present.  Remote  lacunar infarcts are noted in the corona radiata bilaterally, left greater than right.  Remote lacunar infarcts are present in the basal ganglia bilaterally as well.  No hemorrhage or mass lesion is present.  The ventricles are proportionate to the degree of atrophy.  Flow is present in the major intracranial arteries.  There is some ectasia of the right internal carotid artery.  The patient is status post scleral banding.  The right maxillary sinus shrunken, compatible with a history of chronic disease.  No active disease is evident.  Minimal mucosal thickening is noted in the ethmoid air cells and frontal sinuses.  There is fluid in the right mastoid air cells.  No obstructing nasopharyngeal lesion is evident.  IMPRESSION:  1.  No acute intracranial abnormality. 2.  Age advanced atrophy and moderate white matter disease likely reflects the sequelae of chronic microvascular ischemia. 3.  Remote lacunar infarcts of the corona radiata and basal ganglia.  MRA HEAD  Findings: The cervical left internal carotid artery is tortuous. Mild irregularities are noted within the cavernous carotid arteries, right greater than left.  There is no significant stenosis relative to the distal vessel.  Mild moderate narrowing mild narrowing is present proximal left A1 segment.  The M1 segments are intact bilaterally.  There is focal signal loss at the MCA bifurcation bilaterally.  Moderate small vessel disease is evident bilaterally, left greater than right.  The left vertebral artery is dominant vessel.  The left PICA origin is below the field of view.  The basilar artery is narrowed, less than 50%.  Both posterior cerebral arteries originate from the basilar tip.  Moderate branch vessel attenuation is present bilaterally.  IMPRESSION:  1.  Moderate small vessel disease.  This is worse left than right in the MCA branch vessels. 2.  Mild diffuse narrowing of the basilar artery, less than 50%. 3.  Atherosclerotic changes within the  cavernous carotid arteries bilaterally, right greater than left, without a significant stenosis.   Original Report Authenticated By: Marin Roberts, M.D.    Mr Mra Head/brain Wo Cm  06/21/2012  *RADIOLOGY REPORT*  Clinical Data:  Speech difficulty.  The stroke.  MRI HEAD WITHOUT CONTRAST MRA HEAD WITHOUT CONTRAST  Technique:  Multiplanar, multiecho pulse sequences of the brain and surrounding structures were obtained without intravenous contrast. Angiographic images of the head were obtained using MRA technique without contrast.  Comparison:  CT head without contrast 06/20/2012.  MRI HEAD  Findings:  The diffusion weighted sequence demonstrates no evidence for acute or subacute infarction.  Advanced generalized atrophy and white matter disease is present.  Remote lacunar infarcts are noted in the corona radiata bilaterally, left greater than right.  Remote lacunar infarcts are present in the basal ganglia bilaterally as well.  No hemorrhage or mass lesion is present.  The ventricles are proportionate to the degree of atrophy.  Flow is present in the major intracranial arteries.  There is some ectasia of the right internal carotid artery.  The patient is status post scleral banding.  The right maxillary sinus shrunken, compatible with a history of chronic disease.  No active disease is evident.  Minimal mucosal thickening is noted in the ethmoid air cells and frontal sinuses.  There is fluid in the right mastoid air cells.  No obstructing nasopharyngeal lesion is evident.  IMPRESSION:  1.  No acute intracranial abnormality. 2.  Age advanced atrophy and moderate white matter disease likely reflects the sequelae of chronic microvascular ischemia. 3.  Remote lacunar infarcts of the corona radiata and basal ganglia.  MRA HEAD  Findings: The cervical left internal carotid artery is tortuous. Mild irregularities are noted within the cavernous carotid arteries, right greater than left.  There is no significant  stenosis relative to the distal vessel.  Mild moderate narrowing mild narrowing is present proximal left A1 segment.  The M1 segments are intact bilaterally.  There is focal signal loss at the MCA bifurcation bilaterally.  Moderate small vessel disease is evident bilaterally, left greater than right.  The left vertebral artery is dominant vessel.  The left PICA origin is below the field of view.  The basilar artery is narrowed, less than 50%.  Both posterior cerebral arteries originate from the basilar tip.  Moderate  branch vessel attenuation is present bilaterally.  IMPRESSION:  1.  Moderate small vessel disease.  This is worse left than right in the MCA branch vessels. 2.  Mild diffuse narrowing of the basilar artery, less than 50%. 3.  Atherosclerotic changes within the cavernous carotid arteries bilaterally, right greater than left, without a significant stenosis.   Original Report Authenticated By: Marin Roberts, M.D.    Scheduled Meds: . amLODipine  10 mg Oral Daily  . bimatoprost  1 drop Both Eyes QHS  . Chlorhexidine Gluconate Cloth  6 each Topical Q0600  . clopidogrel  75 mg Oral Q breakfast  . enoxaparin (LOVENOX) injection  40 mg Subcutaneous Q24H  . insulin aspart  0-9 Units Subcutaneous TID WC  . irbesartan  150 mg Oral Daily  . mupirocin ointment  1 application Nasal BID  . potassium chloride  30 mEq Oral Once  . tamsulosin  0.4 mg Oral BID  . timolol  1 drop Both Eyes BID   Continuous Infusions:   Principal Problem:   CVA (cerebral infarction) Active Problems:   Carotid stenosis   HTN (hypertension)   DM (diabetes mellitus)   GERD (gastroesophageal reflux disease)  [name], MD [facility] Pager [phone]. If 7 PM - 7 AM, please contact night-coverage at [email or web address], password [facility] 06/22/2012, 7:43 AM  LOS: 2 days

## 2012-06-22 NOTE — Progress Notes (Signed)
Utilization review completed.  

## 2012-06-22 NOTE — Progress Notes (Signed)
[facility]URO HOSPITALIST PROGRESS NOTE   SUBJECTIVE:                                                                                                                        Stated that he is getting " better and better". Speech seems to be back to baseline. Denies headache, vertigo, double vision, difficulty swallowing, focal weakness or numbness, language or visual impairment. MRI brain showed no acute intracranial abnormality. Carotid ultrasound revealed no evidence of hemodynamically significant carotid disease. No complains. Importantly, family reported to the primary medical team that Ronald Hunt has been experiencing progressive cognitive and behavioral decline.   OBJECTIVE:                                                                                                                           Vital signs in last 24 hours: Temp:  [97.5 F (36.4 C)-98.5 F (36.9 C)] 97.7 F (36.5 C) (03/30 1318) Pulse Rate:  [64-72] 65 (03/30 1318) Resp:  [18-20] 20 (03/30 1318) BP: (141-181)/(63-83) 163/74 mmHg (03/30 1318) SpO2:  [98 %-100 %] 100 % (03/30 1318)  Intake/Output from previous day:   Intake/Output this shift: Total I/O In: 560 [P.O.:560] Out: 1400 [Urine:1400] Nutritional status: Carb Control  Past Medical History  Diagnosis Date  . Dizziness   . Macular degeneration   . History of TIAs   . Carotid artery occlusion   . Glaucoma   . Stroke     TIA  . Mitral valvular disorder   . Hypertension     takes Amlodipine and Benicar daily  . Heart murmur   . Hoarseness     states pretty much all the time  . Diabetes mellitus     takes Glimepiride daily  . GERD (gastroesophageal reflux disease)     doesn't require meds  . Constipation     occasionally Mag Citrate  . Urinary frequency   . Cancer     prostate;takes Flomax daily  . Nocturia   . Depression     but doesn't require meds  . Anxiety     doesn't take any meds  . Short-term  memory loss   . Long-term memory loss   . Aortic insufficiency  mild-moderate AR by echo 07/2011 (Dr. Rennis Golden)  . Acute kidney failure, unspecified   . Urinary tract infection, site not specified   . Orthostatic hypotension   . Dementia   . Other symptoms involving cardiovascular system   . Unspecified transient cerebral ischemia   . Unspecified vitamin D deficiency   . Malignant neoplasm of prostate   . Unspecified glaucoma(365.9)   . Senile cataract, unspecified   . Unspecified essential hypertension   . Other abnormal blood chemistry   . Proteinuria     Neurologic ROS negative with exception of above.  Neurologic Exam:  Alert, awake, oriented, thought content appropriate. Speech fluent with short answers. Able to follow simple commands. Cranial Nerves:  II: Discs flat bilaterally; Visual fields grossly normal, pupils pinpoint  III,IV, VI: ptosis not present, extra-ocular motions intact bilaterally  V,VII: decreased left NLF, facial light touch sensation normal bilaterally  VIII: hearing normal bilaterally  IX,X: gag reflex present  XI: bilateral shoulder shrug  XII: midline tongue extension  Motor:  Right : Upper extremity 5/5 Left: Upper extremity 5/5  Lower extremity 5/5 Lower extremity 5/5  Tone and bulk:normal tone throughout; no atrophy noted  Sensory: Pinprick and light touch intact throughout, bilaterally  Deep Tendon Reflexes: 2+ throughout with absent AJ's bilaterally  Plantars:  Right: downgoing Left: upgoing  Cerebellar:  Normal finger-to-nose. Unable to follow directions enough to perform heel-to-shin test  Gait: Unable to test  CV: pulses palpable throughout    Lab Results: Lab Results  Component Value Date/Time   CHOL 153 06/21/2012  6:00 AM   Lipid Panel  Recent Labs  06/21/12 0600  CHOL 153  TRIG 127  HDL 35*  CHOLHDL 4.4  VLDL 25  LDLCALC 93    Studies/Results: Dg Chest 2 View  06/20/2012  *RADIOLOGY REPORT*  Clinical Data: Chest  pain.  CHEST - 2 VIEW  Comparison: 10/20/2011.  Findings: Normal sized heart.  Clear lungs.  Thoracic spine degenerative changes, including changes of DISH.  Stable calcified granuloma overlying the medial right lung base.  IMPRESSION: No acute abnormality.   Original Report Authenticated By: Beckie Salts, M.D.    Ct Head Wo Contrast  06/20/2012  *RADIOLOGY REPORT*  Clinical Data: Progressive confusion, altered mental status, new since 06/19/2012  CT HEAD WITHOUT CONTRAST  Technique:  Contiguous axial images were obtained from the base of the skull through the vertex without contrast.  Comparison: 10/20/2011  Findings: Generalized atrophy. Normal ventricular morphology. No midline shift or mass effect. Small vessel chronic ischemic changes of deep cerebral white matter. Old left periventricular white matter infarct. No intracranial hemorrhage, mass lesion or evidence of acute infarction. No extra-axial fluid collections. Bones and sinuses unremarkable.  IMPRESSION: Atrophy with small vessel chronic ischemic changes of deep cerebral white matter. Old left parietal periventricular white matter infarct. No acute intracranial abnormalities.   Original Report Authenticated By: Ulyses Southward, M.D.    Mr Brain Wo Contrast  06/21/2012  *RADIOLOGY REPORT*  Clinical Data:  Speech difficulty.  The stroke.  MRI HEAD WITHOUT CONTRAST MRA HEAD WITHOUT CONTRAST  Technique:  Multiplanar, multiecho pulse sequences of the brain and surrounding structures were obtained without intravenous contrast. Angiographic images of the head were obtained using MRA technique without contrast.  Comparison:  CT head without contrast 06/20/2012.  MRI HEAD  Findings:  The diffusion weighted sequence demonstrates no evidence for acute or subacute infarction.  Advanced generalized atrophy and white matter disease is present.  Remote lacunar infarcts are noted in  the corona radiata bilaterally, left greater than right.  Remote lacunar infarcts are  present in the basal ganglia bilaterally as well.  No hemorrhage or mass lesion is present.  The ventricles are proportionate to the degree of atrophy.  Flow is present in the major intracranial arteries.  There is some ectasia of the right internal carotid artery.  The patient is status post scleral banding.  The right maxillary sinus shrunken, compatible with a history of chronic disease.  No active disease is evident.  Minimal mucosal thickening is noted in the ethmoid air cells and frontal sinuses.  There is fluid in the right mastoid air cells.  No obstructing nasopharyngeal lesion is evident.  IMPRESSION:  1.  No acute intracranial abnormality. 2.  Age advanced atrophy and moderate white matter disease likely reflects the sequelae of chronic microvascular ischemia. 3.  Remote lacunar infarcts of the corona radiata and basal ganglia.  MRA HEAD  Findings: The cervical left internal carotid artery is tortuous. Mild irregularities are noted within the cavernous carotid arteries, right greater than left.  There is no significant stenosis relative to the distal vessel.  Mild moderate narrowing mild narrowing is present proximal left A1 segment.  The M1 segments are intact bilaterally.  There is focal signal loss at the MCA bifurcation bilaterally.  Moderate small vessel disease is evident bilaterally, left greater than right.  The left vertebral artery is dominant vessel.  The left PICA origin is below the field of view.  The basilar artery is narrowed, less than 50%.  Both posterior cerebral arteries originate from the basilar tip.  Moderate branch vessel attenuation is present bilaterally.  IMPRESSION:  1.  Moderate small vessel disease.  This is worse left than right in the MCA branch vessels. 2.  Mild diffuse narrowing of the basilar artery, less than 50%. 3.  Atherosclerotic changes within the cavernous carotid arteries bilaterally, right greater than left, without a significant stenosis.   Original Report  Authenticated By: Marin Roberts, M.D.    Mr Mra Head/brain Wo Cm  06/21/2012  *RADIOLOGY REPORT*  Clinical Data:  Speech difficulty.  The stroke.  MRI HEAD WITHOUT CONTRAST MRA HEAD WITHOUT CONTRAST  Technique:  Multiplanar, multiecho pulse sequences of the brain and surrounding structures were obtained without intravenous contrast. Angiographic images of the head were obtained using MRA technique without contrast.  Comparison:  CT head without contrast 06/20/2012.  MRI HEAD  Findings:  The diffusion weighted sequence demonstrates no evidence for acute or subacute infarction.  Advanced generalized atrophy and white matter disease is present.  Remote lacunar infarcts are noted in the corona radiata bilaterally, left greater than right.  Remote lacunar infarcts are present in the basal ganglia bilaterally as well.  No hemorrhage or mass lesion is present.  The ventricles are proportionate to the degree of atrophy.  Flow is present in the major intracranial arteries.  There is some ectasia of the right internal carotid artery.  The patient is status post scleral banding.  The right maxillary sinus shrunken, compatible with a history of chronic disease.  No active disease is evident.  Minimal mucosal thickening is noted in the ethmoid air cells and frontal sinuses.  There is fluid in the right mastoid air cells.  No obstructing nasopharyngeal lesion is evident.  IMPRESSION:  1.  No acute intracranial abnormality. 2.  Age advanced atrophy and moderate white matter disease likely reflects the sequelae of chronic microvascular ischemia. 3.  Remote lacunar infarcts of the corona radiata  and basal ganglia.  MRA HEAD  Findings: The cervical left internal carotid artery is tortuous. Mild irregularities are noted within the cavernous carotid arteries, right greater than left.  There is no significant stenosis relative to the distal vessel.  Mild moderate narrowing mild narrowing is present proximal left A1 segment.   The M1 segments are intact bilaterally.  There is focal signal loss at the MCA bifurcation bilaterally.  Moderate small vessel disease is evident bilaterally, left greater than right.  The left vertebral artery is dominant vessel.  The left PICA origin is below the field of view.  The basilar artery is narrowed, less than 50%.  Both posterior cerebral arteries originate from the basilar tip.  Moderate branch vessel attenuation is present bilaterally.  IMPRESSION:  1.  Moderate small vessel disease.  This is worse left than right in the MCA branch vessels. 2.  Mild diffuse narrowing of the basilar artery, less than 50%. 3.  Atherosclerotic changes within the cavernous carotid arteries bilaterally, right greater than left, without a significant stenosis.   Original Report Authenticated By: Marin Roberts, M.D.     MEDICATIONS                                                                                                                       I have reviewed the patient's current medications.  ASSESSMENT/PLAN:                                                                                                           Transient neurological dysfunction characterized by speech changes, now resolved. Unremarkable MRI-DWI and CUS. I suspect patient's cognitive impairment could be playing a role in those transient episodes of speech disturbances. No further inpatient neurological testing needed.  Wyatt Portela ,MD  [facility] [phone]  06/22/2012, 4:27 PM

## 2012-06-23 ENCOUNTER — Ambulatory Visit: Payer: Self-pay | Admitting: Pharmacotherapy

## 2012-06-23 DIAGNOSIS — N179 Acute kidney failure, unspecified: Secondary | ICD-10-CM

## 2012-06-23 DIAGNOSIS — R4182 Altered mental status, unspecified: Secondary | ICD-10-CM

## 2012-06-23 DIAGNOSIS — G459 Transient cerebral ischemic attack, unspecified: Secondary | ICD-10-CM

## 2012-06-23 DIAGNOSIS — I6350 Cerebral infarction due to unspecified occlusion or stenosis of unspecified cerebral artery: Secondary | ICD-10-CM

## 2012-06-23 DIAGNOSIS — I6529 Occlusion and stenosis of unspecified carotid artery: Secondary | ICD-10-CM

## 2012-06-23 LAB — BASIC METABOLIC PANEL
BUN: 23 mg/dL (ref 6–23)
Calcium: 9.1 mg/dL (ref 8.4–10.5)
Creatinine, Ser: 1.22 mg/dL (ref 0.50–1.35)
GFR calc Af Amer: 60 mL/min — ABNORMAL LOW (ref >90)

## 2012-06-23 LAB — GLUCOSE, CAPILLARY
Glucose-Capillary: 291 mg/dL — ABNORMAL HIGH (ref 70–99)
Glucose-Capillary: 239 mg/dL — ABNORMAL HIGH (ref 70–99)
Glucose-Capillary: 129 mg/dL — ABNORMAL HIGH (ref 70–99)

## 2012-06-23 MED ORDER — ALUM & MAG HYDROXIDE-SIMETH 200-200-20 MG/5ML PO SUSP
30.0000 mL | ORAL | Status: DC | PRN
  Filled 2012-06-23: qty 30

## 2012-06-23 NOTE — Clinical Social Work Psychosocial (Signed)
Clinical Social Work Department BRIEF PSYCHOSOCIAL ASSESSMENT 06/23/2012  Patient:  Ronald Hunt, Ronald Hunt     Account Number:  [id number]     Admit date:  06/20/2012  Clinical Social Worker:  Peggyann Shoals  Date/Time:  06/23/2012 06:29 PM  Referred by:  Physician  Date Referred:  06/23/2012 Referred for  SNF Placement  Other - See comment   Other Referral:   Pt was admitted from a Facility.   Interview type:  Family Other interview type:    PSYCHOSOCIAL DATA Living Status:  FACILITY Admitted from facility:  [facility] Level of care:  Assisted Living Primary support name:  Kartier Bennison Primary support relationship to patient:  CHILD, ADULT Degree of support available:   Supportive.    CURRENT CONCERNS Current Concerns  Post-Acute Placement   Other Concerns:    SOCIAL WORK ASSESSMENT / PLAN CSW met with pt and family to address consult. Pt is a resident of [facility]F. At this time, PT/OT are recommending SNF at discharged. Per pt's family, he has been to [facility] in the past, and the family would like pt to return, if possible. CSW and family discussed the possibility that pt will need long term care. CSW explored options for applying for Medicaid.    CSW will initiate SNF referral and follow up wtih bed offers. CSW will continue to follow.   Assessment/plan status:  Psychosocial Support/Ongoing Assessment of Needs Other assessment/ plan:   Information/referral to community resources:   SNF List  Long Term Medicaid    PATIENT'S/FAMILY'S RESPONSE TO PLAN OF CARE: Pt was working with PT during interview. Pt's family is agreeable to discharge to SNF.   Dede Query, MSW, LCSW [phone]

## 2012-06-23 NOTE — Progress Notes (Signed)
*  PRELIMINARY RESULTS* Echocardiogram 2D Echocardiogram has been performed.  Ronald Hunt 06/23/2012, 5:02 PM

## 2012-06-23 NOTE — Progress Notes (Signed)
[facility] PROGRESS NOTE  Ronald Hunt ZOX:[id number] DOB: 04-Mar-1924 DOA: 06/20/2012 PCP: Ronald Sleeper, MD  Brief Narrative: Ronald Hunt is an 79 y.o. male with a history of insulin dependent DM, TIA's s/p CEA who awakened today with difficulty with speech. His son provides much of the history. It seems that yesterday he was at baseline for the most part other than not looking quite right per family when he went to the doctor. He was able to come back and eat normally. Gait was normal and he had his usual conversation with his sone by phone in the evening. This morning his speech was altered. At times it was jibberish. His gait seemed slow as well although he maintains the ability to ambulate. He is at baseline often uncooperative but today has been more easy-going than usual. He has had many of these type episodes in the past but they usually last about 15 minutes before resolving on their own. This particular event has been more protracted.  Assessment/Plan:  Slurred speech - 2d echo pending - to be continued on plavix for secondary prevention  - PT/OT consult - SNF tomorrow  - Telemetry  - neurochecks  - speech consult  - neurology following, appreciate their input.   Dementia - patient has been more and more cognitively impaired over the past few months with episodes of hallucinations  - while they feel like along with this episode he has been more confused than normal, but they do feel that he has been overall declining over the last few months.  Carotid stenosis:  Recent CEA   HTN;  Continue home meds.   DM:  SSI, Hba1c   GERD:  Continue home meds.  Code Status: Full Family Communication: none  Disposition Plan: SNF 4/1  Consultants:  Neurology  Procedures:  none  Antibiotics:  none  HPI/Subjective: - pleasantly confused   Objective: Filed Vitals:   06/22/12 2200 06/23/12 0200 06/23/12 0600 06/23/12 1106  BP: 147/87 176/87 135/72  82/42  Pulse: 72 78 66 60  Temp: 98.2 F (36.8 C) 97.9 F (36.6 C) 98.7 F (37.1 C) 97.4 F (36.3 C)  TempSrc: Oral Oral Oral   Resp: 20 20 20 20   Height:      Weight:      SpO2: 98% 98% 98%     Intake/Output Summary (Last 24 hours) at 06/23/12 1712 Last data filed at 06/23/12 1546  Gross per 24 hour  Intake    720 ml  Output   1100 ml  Net   -380 ml   Filed Weights   06/20/12 2211  Weight: 76.6 kg (168 lb 14 oz)   Exam:  General:  NAD, pleasantly confused, walking in the hallway  Cardiovascular: regular rate and rhythm, without MRG  Respiratory: good air movement, clear to auscultation throughout, no wheezing, ronchi or rales  Abdomen: soft, not tender to palpation, positive bowel sounds  MSK: no peripheral edema  Neuro:  MS 5/5 in all 4  Data Reviewed: Basic Metabolic Panel:  Recent Labs Lab 06/20/12 1828 06/22/12 0526 06/23/12 0435  NA 141 139 142  K 3.6 3.3* 3.7  CL 105 102 106  CO2 27 27 25   GLUCOSE 174* 180* 171*  BUN 25* 22 23  CREATININE 1.36* 1.21 1.22  CALCIUM 9.3 9.1 9.1   Liver Function Tests:  Recent Labs Lab 06/20/12 1828  AST 12  ALT 8  ALKPHOS 59  BILITOT 0.3  PROT 6.4  ALBUMIN 3.4*  CBC:  Recent Labs Lab 06/20/12 1828  WBC 6.8  HGB 11.5*  HCT 33.0*  MCV 85.5  PLT 135*   Cardiac Enzymes:  Recent Labs Lab 06/20/12 1914  TROPONINI <0.30   CBG:  Recent Labs Lab 06/22/12 1219 06/22/12 1644 06/22/12 2111 06/23/12 0637 06/23/12 1136  GLUCAP 208* 228* 209* 153* 291*    Recent Results (from the past 240 hour(s))  URINE CULTURE     Status: None   Collection Time    06/20/12  7:41 PM      Result Value Range Status   Specimen Description URINE, CLEAN CATCH   Final   Special Requests NONE   Final   Culture  Setup Time 06/21/2012 02:12   Final   Colony Count 6,000 COLONIES/ML   Final   Culture INSIGNIFICANT GROWTH   Final   Report Status 06/22/2012 FINAL   Final  MRSA PCR SCREENING     Status: Abnormal    Collection Time    06/21/12  6:03 AM      Result Value Range Status   MRSA by PCR POSITIVE (*) NEGATIVE Final   Comment:            The GeneXpert MRSA Assay (FDA     approved for NASAL specimens     only), is one component of a     comprehensive MRSA colonization     surveillance program. It is not     intended to diagnose MRSA     infection nor to guide or     monitor treatment for     MRSA infections.     RESULT CALLED TO, READ BACK BY AND VERIFIED WITH:     RIMMER,A RN 06/21/12 0855 WOOTEN,K    Studies: No results found. Scheduled Meds: . amLODipine  10 mg Oral Daily  . bimatoprost  1 drop Both Eyes QHS  . Chlorhexidine Gluconate Cloth  6 each Topical Q0600  . clopidogrel  75 mg Oral Q breakfast  . enoxaparin (LOVENOX) injection  40 mg Subcutaneous Q24H  . insulin aspart  0-9 Units Subcutaneous TID WC  . irbesartan  150 mg Oral Daily  . mupirocin ointment  1 application Nasal BID  . tamsulosin  0.4 mg Oral BID  . timolol  1 drop Both Eyes BID   Continuous Infusions:   Principal Problem:   CVA (cerebral infarction) Active Problems:   Carotid stenosis   HTN (hypertension)   DM (diabetes mellitus)   GERD (gastroesophageal reflux disease)  Ronald Pert, MD [facility] Pager [phone]. If 7 PM - 7 AM, please contact night-coverage at [email or web address], password [facility] 06/23/2012, 5:12 PM  LOS: 3 days

## 2012-06-23 NOTE — Progress Notes (Signed)
Inpatient Diabetes Program Recommendations  AACE/ADA: New Consensus Statement on Inpatient Glycemic Control (2013)  Target Ranges:  Prepandial:   less than 140 mg/dL      Peak postprandial:   less than 180 mg/dL (1-2 hours)      Critically ill patients:  140 - 180 mg/dL  Results for Ronald Hunt, Ronald Hunt (MRN [id number]) as of 06/23/2012 16:39  Ref. Range 06/22/2012 12:19 06/22/2012 16:44 06/22/2012 21:11 06/23/2012 06:37 06/23/2012 11:36  Glucose-Capillary Latest Range: 70-99 mg/dL 962 (H) 952 (H) 841 (H) 153 (H) 291 (H)    Inpatient Diabetes Program Recommendations Insulin - Basal: start home dose Lantus 10 units  May also need Novolog prandial insulin Will follow. Thank you  Piedad Climes BSN, RN,CDE Inpatient Diabetes Coordinator [phone] (team pager)

## 2012-06-23 NOTE — Evaluation (Addendum)
Speech Language Pathology Evaluation Patient Details Name: Ronald Hunt MRN: [id number] DOB: [birth year] Today's Date: 06/23/2012 Time: 0865-7846 SLP Time Calculation (min): 29 min  Problem List:  Patient Active Problem List  Diagnosis  . Carotid stenosis  . Occlusion and stenosis of carotid artery without mention of cerebral infarction  . Glaucoma  . CVA (cerebral infarction)  . HTN (hypertension)  . DM (diabetes mellitus)  . GERD (gastroesophageal reflux disease)  . Slurred speech  . Acute renal failure  . Hypokalemia  . TIA (transient ischemic attack)   Past Medical History:  Past Medical History  Diagnosis Date  . Dizziness   . Macular degeneration   . History of TIAs   . Carotid artery occlusion   . Glaucoma   . Stroke     TIA  . Mitral valvular disorder   . Hypertension     takes Amlodipine and Benicar daily  . Heart murmur   . Hoarseness     states pretty much all the time  . Diabetes mellitus     takes Glimepiride daily  . GERD (gastroesophageal reflux disease)     doesn't require meds  . Constipation     occasionally Mag Citrate  . Urinary frequency   . Cancer     prostate;takes Flomax daily  . Nocturia   . Depression     but doesn't require meds  . Anxiety     doesn't take any meds  . Short-term memory loss   . Long-term memory loss   . Aortic insufficiency     mild-moderate AR by echo 07/2011 (Dr. Rennis Golden)  . Acute kidney failure, unspecified   . Urinary tract infection, site not specified   . Orthostatic hypotension   . Dementia   . Other symptoms involving cardiovascular system   . Unspecified transient cerebral ischemia   . Unspecified vitamin D deficiency   . Malignant neoplasm of prostate   . Unspecified glaucoma(365.9)   . Senile cataract, unspecified   . Unspecified essential hypertension   . Other abnormal blood chemistry   . Proteinuria    Past Surgical History:  Past Surgical History  Procedure Laterality Date  . Retinal  detachment surgery    . Cataract extraction    . Endarterectomy  09/12/2011    Procedure: ENDARTERECTOMY CAROTID;  Surgeon: Sherren Kerns, MD;  Location: [facility] OR;  Service: Vascular;  Laterality: Right;  right carotid artery endarterectomy  with dacron patch angioplasty  . Carotid endarterectomy  09/12/11    RIGHT  cea   HPI: Pt admitted with speech changes, CT head negative, CXR negative.  Pt oriented to his address.  Has h/o TIAs, memory loss, CVA.  Speech evaluation ordered.  Pt resides at ALF  And reports his son Barbara Cower manages his finances and his shopping.      Assessment / Plan / Recommendation Clinical Impression  Pt presents with mild cogling deficits, mild dysarthria and word finding difficulties.   Pt has known h/o memory difficulties and left parietal cva and no family is present to establish baseline.  Pt's able to verbalize needs, desires but is disoriented to his age, current city.  He was able to follow two step directions.  Mild word finding deficits noted resulting in dysfluencies at conversation level.  Pt reports he is near baseline.   SLP to follow up with family to establish baseline- pt agreeable.  Goals established for current situation, note PT recommends 24/7 supervision.       SLP  Assessment  Patient needs continued Speech Lanaguage Pathology Services    Follow Up Recommendations    TBD   Frequency and Duration min 2x/week  1 week   Pertinent Vitals/Pain Afebrile, decreased   SLP Goals  SLP Goals Potential to Achieve Goals: Fair Potential Considerations: Ability to learn/carryover information;Previous level of function SLP Goal #1: Pt will demonstrate speech intelligibility to 90% at brief conversation level with mod assistance.  SLP Goal #1 - Progress: Not met SLP Goal #2: Pt will be oriented x4 with min assistance/verbal cues.   SLP Goal #2 - Progress: Not met  SLP Evaluation Prior Functioning  Vocation: Retired Social research officer, government)   Cognition   Arousal/Alertness: Awake/alert Orientation Level: Oriented to person;Oriented to situation;Oriented to place;Disoriented to time Attention: Sustained Sustained Attention: Appears intact Memory: Impaired Memory Impairment: Retrieval deficit;Decreased recall of new information (pt has h/o memory impairment per chart) Awareness: Impaired (? decreased visual attn to right side- pt denies) Awareness Impairment: Intellectual impairment Problem Solving: Impaired (pt questioning why need to call for help, ) Problem Solving Impairment: Verbal basic Safety/Judgment: Impaired (pt on fall risk precautions)    Comprehension  Auditory Comprehension Commands: Impaired Two Step Basic Commands: 75-100% accurate Multistep Basic Commands: 50-74% accurate Conversation: Simple EffectiveTechniques: Extra processing time;Increased volume Visual Recognition/Discrimination Discrimination: Not tested Reading Comprehension Reading Status: Not tested (decr ability to read calendar, read 3 for 31)    Expression Expression Primary Mode of Expression: Verbal Verbal Expression Initiation: Impaired Level of Generative/Spontaneous Verbalization: Conversation Repetition: No impairment Naming: No impairment Pragmatics: No impairment Other Verbal Expression Comments: pt with decreased fluency at conversation level, ? word finding difficulties, named 8 animals in 60 seconds, baseline parietal cva Written Expression Dominant Hand: Right Written Expression: Not tested   Oral / Motor Oral Motor/Sensory Function Overall Oral Motor/Sensory Function: Appears within functional limits for tasks assessed Motor Speech Overall Motor Speech: Impaired (mildly dysarthric, uncertain of baseline) Respiration: Within functional limits Intelligibility: Intelligibility reduced Word: 75-100% accurate Phrase: 75-100% accurate Sentence: 75-100% accurate Conversation: 50-74% accurate Motor Planning: Witnin functional limits Motor  Speech Errors: Aware Interfering Components: Premorbid status;Hearing loss Effective Techniques: Slow rate;Increased vocal intensity   GO     Mickie Bail [place], [place] [facility] SLP [phone]

## 2012-06-23 NOTE — Plan of Care (Signed)
Problem: Phase II Progression Outcomes Goal: Discharge plan established Recommend SNF for further therapy needs after acute care d/c     

## 2012-06-23 NOTE — Clinical Social Work Placement (Addendum)
Clinical Social Work Department CLINICAL SOCIAL WORK PLACEMENT NOTE 06/23/2012  Patient:  Ronald Hunt, Ronald Hunt  Account Number:  [id number] Admit date:  06/20/2012  Clinical Social Worker:  Peggyann Shoals  Date/time:  06/23/2012 06:35 PM  Clinical Social Work is seeking post-discharge placement for this patient at the following level of care:   SKILLED NURSING   (*CSW will update this form in Epic as items are completed)   06/23/2012  Patient/family provided with [facility] Department of Clinical Social Work's list of facilities offering this level of care within the geographic area requested by the patient (or if unable, by the patient's family).  06/23/2012  Patient/family informed of their freedom to choose among providers that offer the needed level of care, that participate in Medicare, Medicaid or managed care program needed by the patient, have an available bed and are willing to accept the patient.  06/23/2012  Patient/family informed of [facility]' ownership interest in [facility], as well as of the fact that they are under no obligation to receive care at this facility.  PASARR submitted to EDS on Existing PASARR PASARR number received from EDS on   FL2 transmitted to all facilities in geographic area requested by pt/family on  06/24/2012 FL2 transmitted to all facilities within larger geographic area on   Patient informed that his/her managed care company has contracts with or will negotiate with  certain facilities, including the following:     Patient/family informed of bed offers received:  4/1/103 Patient chooses bed at [facility] Physician recommends and patient chooses bed at  SNF  Patient to be transferred to [facility] on 06/26/2012 Patient to be transferred to facility by [facility]  The following physician request were entered in Epic:   Additional Comments:  Dede Query, MSW, LCSW [phone]

## 2012-06-23 NOTE — Progress Notes (Signed)
Physical Therapy Treatment Note   06/23/12 0841  PT Visit Information  Last PT Received On 06/23/12  Assistance Needed +1  PT Time Calculation  PT Start Time 0841  PT Stop Time 0904  PT Time Calculation (min) 23 min  Subjective Data  Subjective Pt received supine in bed agreeable to PT.  Precautions  Precautions Fall  Precaution Comments pt with noted vision impairment. minimal vision out of R eye  Restrictions  Weight Bearing Restrictions No  Cognition  Overall Cognitive Status Impaired  Area of Impairment Attention;Memory;Safety/judgement  Arousal/Alertness Awake/alert  Orientation Level Oriented X4 / Intact  Behavior During Session [facility] for tasks performed  Current Attention Level Sustained  Memory Deficits unable to recall why he was in the hospital  Safety/Judgement Decreased awareness of need for assistance  Safety/Judgement - Other Comments sitter present due to overnight confusion  Bed Mobility  Bed Mobility Supine to Sit  Supine to Sit 4: Min assist;HOB flat  Details for Bed Mobility Assistance increased time  Transfers  Transfers Sit to Stand;Stand to Sit  Sit to Stand 4: Min assist;With upper extremity assist;From bed  Stand to Sit 4: Min assist;With upper extremity assist;To chair/3-in-1;With armrests  Details for Transfer Assistance v/c's for hand placement, unsteady upon intial stand. v/c's for safe foot placement, pt reaching for something to hold onto once stand achieved  Ambulation/Gait  Ambulation/Gait Assistance 3: Mod assist  Ambulation Distance (Feet) 200 Feet  Assistive device Rolling walker  Ambulation/Gait Assistance Details modA to manage walker due to constant vearing to R, max verbal cues to stay in walker.  Gait Pattern Step-through pattern;Shuffle;Trunk flexed;Narrow base of support  Gait velocity slow  General Gait Details took 3 standing rest breaks  Stairs No  Modified Rankin (Stroke Patients Only)  Pre-Morbid Rankin Score 1  Modified  Rankin 4  PT - End of Session  Equipment Utilized During Treatment Gait belt  Activity Tolerance Patient tolerated treatment well  Patient left in chair;with call bell/phone within reach  Nurse Communication Mobility status  PT - Assessment/Plan  Comments on Treatment Session Pt con't to be at increased falls risk and impaired R vision. Pt to strongly benefit from ST-SNF placement to achieve safe mod I function prior to returning to assited living apartment.  PT Plan Discharge plan remains appropriate;Frequency remains appropriate  PT Frequency Min 4X/week  Follow Up Recommendations SNF;Supervision/Assistance - 24 hour  Acute Rehab PT Goals  PT Goal: Supine/Side to Sit - Progress Progressing toward goal  PT Goal: Sit to Supine/Side - Progress Progressing toward goal  PT Goal: Sit to Stand - Progress Progressing toward goal  PT Goal: Ambulate - Progress Progressing toward goal  PT General Charges  $$ ACUTE PT VISIT 1 Procedure  PT Treatments  $Gait Training 23-37 mins    Pain: pt denies  Lewis Shock, PT, DPT Pager #: [phone] Office #: [phone]

## 2012-06-23 NOTE — Evaluation (Signed)
Occupational Therapy Evaluation Patient Details Name: Ronald Hunt MRN: [id number] DOB: 07-Jun-1923 Today's Date: 06/23/2012 Time: 4098-1191 OT Time Calculation (min): 26 min  OT Assessment / Plan / Recommendation Clinical Impression  Pt demos decline in function with ADLs, strength, balance, safety and activity tolerance following CVA. Pt would benefit from OT services to address these impairments to help restore PLOF to return to ALF    OT Assessment  Patient needs continued OT Services    Follow Up Recommendations  SNF;Supervision/Assistance - 24 hour    Barriers to Discharge Decreased caregiver support    Equipment Recommendations  None recommended by OT    Recommendations for Other Services    Frequency  Min 2X/week    Precautions / Restrictions Precautions Precautions: Fall Precaution Comments: pt with noted vision impairment. minimal vision out of R eye Restrictions Weight Bearing Restrictions: No       ADL  Eating/Feeding: Performed;Set up Where Assessed - Eating/Feeding: Chair Grooming: Performed;Min guard Where Assessed - Grooming: Supported standing Upper Body Bathing: Simulated;Supervision/safety;Set up Lower Body Bathing: Simulated Upper Body Dressing: Performed;Supervision/safety;Set up Lower Body Dressing: Performed;Minimal assistance Where Assessed - Lower Body Dressing: Unsupported sitting;Supported sit to stand Toilet Transfer: Performed;Minimal assistance Toilet Transfer Method: Sit to stand Toilet Transfer Equipment: Regular height toilet Toileting - Clothing Manipulation and Hygiene: Min guard Where Assessed - Glass blower/designer Manipulation and Hygiene: Standing Equipment Used: Gait belt;Rolling walker Transfers/Ambulation Related to ADLs: Pt required cues for safety for correct hand placement and to keep body positioned safely and properly inside of walker during mobility    OT Diagnosis: Generalized weakness  OT Problem List: Decreased  strength;Decreased knowledge of use of DME or AE;Decreased knowledge of precautions;Decreased activity tolerance;Impaired balance (sitting and/or standing);Decreased safety awareness OT Treatment Interventions: Self-care/ADL training;Balance training;Therapeutic exercise;Neuromuscular education;Therapeutic activities;DME and/or AE instruction;Patient/family education   OT Goals Acute Rehab OT Goals OT Goal Formulation: With patient Time For Goal Achievement: 06/30/12 Potential to Achieve Goals: Good ADL Goals Pt Will Perform Grooming: with set-up;with supervision;Standing at sink ADL Goal: Grooming - Progress: Goal set today Pt Will Perform Lower Body Bathing: with set-up;with supervision ADL Goal: Lower Body Bathing - Progress: Goal set today Pt Will Perform Lower Body Dressing: with set-up;with supervision ADL Goal: Lower Body Dressing - Progress: Goal set today Pt Will Transfer to Toilet: with supervision;with DME;Grab bars ADL Goal: Toilet Transfer - Progress: Goal set today Pt Will Perform Toileting - Clothing Manipulation: with supervision;Standing ADL Goal: Toileting - Clothing Manipulation - Progress: Goal set today Pt Will Perform Tub/Shower Transfer: with supervision;with DME;Grab bars ADL Goal: Tub/Shower Transfer - Progress: Goal set today  Visit Information  Last OT Received On: 06/23/12    Subjective Data  Subjective: " I think I am doing better " Patient Stated Goal: To return to ALF   Prior Functioning     Home Living Type of Home: Independent living facility Home Layout: One level Bathroom Shower/Tub: Engineer, manufacturing systems: Standard Prior Function Level of Independence: Independent Able to Take Stairs?: Yes Driving: No Vocation: Retired Musician: No difficulties Dominant Hand: Right         Vision/Perception Vision - History Baseline Vision: Wears glasses all the time Patient Visual Report: No change from  baseline Perception Perception: Within Functional Limits   Cognition  Cognition Overall Cognitive Status: Impaired Area of Impairment: Attention;Memory;Safety/judgement;Awareness of deficits Behavior During Session: [facility] for tasks performed Current Attention Level: Sustained Safety/Judgement: Decreased awareness of need for assistance    Extremity/Trunk Assessment Right  Upper Extremity Assessment RUE ROM/Strength/Tone: [facility] for tasks assessed Left Upper Extremity Assessment LUE ROM/Strength/Tone: WFL for tasks assessed     Mobility Bed Mobility Bed Mobility: Not assessed Transfers Transfers: Sit to Stand;Stand to Sit Sit to Stand: With upper extremity assist;4: Min guard;From chair/3-in-1;From toilet;With armrests Stand to Sit: With upper extremity assist;To chair/3-in-1;With armrests;4: Min guard;To toilet Details for Transfer Assistance: v/c's for hand placement, unsteady upon intial stand. v/c's for safe foot placement, pt reaching for something to hold onto once stand achieved     Exercise     Balance Balance Balance Assessed: No   End of Session OT - End of Session Equipment Utilized During Treatment: Gait belt;Other (comment) (RW) Activity Tolerance: Patient tolerated treatment well Patient left: in chair;with call bell/phone within reach;with family/visitor present  GO     Galen Manila 06/23/2012, 4:08 PM

## 2012-06-24 LAB — GLUCOSE, CAPILLARY

## 2012-06-24 NOTE — Progress Notes (Addendum)
[facility] PROGRESS NOTE  Ronald Hunt:[id number] DOB: 09/05/1923 DOA: 06/20/2012 PCP: Terald Sleeper, MD  Brief Narrative: Ronald Hunt is an 79 y.o. male with a history of insulin dependent DM, TIA's s/p CEA who awakened today with difficulty with speech. His son provides much of the history. It seems that yesterday he was at baseline for the most part other than not looking quite right per family when he went to the doctor. He was able to come back and eat normally. Gait was normal and he had his usual conversation with his sone by phone in the evening. This morning his speech was altered. At times it was jibberish. His gait seemed slow as well although he maintains the ability to ambulate. He is at baseline often uncooperative but today has been more easy-going than usual. He has had many of these type episodes in the past but they usually last about 15 minutes before resolving on their own. This particular event has been more protracted.  Assessment/Plan:  Slurred speech - neurology following, signed off, appreciate their input, may be related to cognitive impairment per neurololy.  Dementia - patient has been more and more cognitively impaired over the past few months with episodes of hallucinations  - while they feel like along with this episode he has been more confused than normal, but they do feel that he has been overall declining over the last few months.  Carotid stenosis:  Recent CEA   HTN;  Continue home meds.   DM:  SSI, Hba1c with poor control  GERD:  Continue home meds.  Code Status: Full Family Communication: none  Disposition Plan: SNF when bed available.   Consultants:  Neurology  Procedures:  2D echo - Procedure narrative: Transthoracic echocardiography. Image quality was poor. - Left ventricle: The cavity size was normal. Wall thickness was increased in a pattern of moderate LVH. Systolic function was normal. The estimated  ejection fraction was in the range of 50% to 55%. Regional wall motion abnormalities cannot be excluded. Doppler parameters are consistent with abnormal left ventricular relaxation (grade 1 diastolic dysfunction). - Aortic valve: Mild regurgitation. - Mitral valve: Mild regurgitation. Impressions:  Antibiotics:  none  HPI/Subjective: - pleasantly confused   Objective: Filed Vitals:   06/23/12 1106 06/23/12 1941 06/23/12 2136 06/24/12 1332  BP: 82/42 142/70 126/73 105/56  Pulse: 60 65 63 61  Temp: 97.4 F (36.3 C) 97.9 F (36.6 C) 97.8 F (36.6 C) 97.8 F (36.6 C)  TempSrc:   Oral Oral  Resp: 20 20 18 18   Height:      Weight:      SpO2:   100% 100%    Intake/Output Summary (Last 24 hours) at 06/24/12 1445 Last data filed at 06/24/12 1220  Gross per 24 hour  Intake   1520 ml  Output    300 ml  Net   1220 ml   Filed Weights   06/20/12 2211  Weight: 76.6 kg (168 lb 14 oz)   Exam:  General:  NAD, pleasantly confused  Cardiovascular: regular rate and rhythm, without MRG  Respiratory: good air movement, clear to auscultation throughout, no wheezing, ronchi or rales  Abdomen: soft, not tender to palpation, positive bowel sounds  MSK: no peripheral edema  Neuro:  MS 5/5 in all 4  Data Reviewed: Basic Metabolic Panel:  Recent Labs Lab 06/20/12 1828 06/22/12 0526 06/23/12 0435  NA 141 139 142  K 3.6 3.3* 3.7  CL 105 102 106  CO2  27 27 25   GLUCOSE 174* 180* 171*  BUN 25* 22 23  CREATININE 1.36* 1.21 1.22  CALCIUM 9.3 9.1 9.1   Liver Function Tests:  Recent Labs Lab 06/20/12 1828  AST 12  ALT 8  ALKPHOS 59  BILITOT 0.3  PROT 6.4  ALBUMIN 3.4*   CBC:  Recent Labs Lab 06/20/12 1828  WBC 6.8  HGB 11.5*  HCT 33.0*  MCV 85.5  PLT 135*   Cardiac Enzymes:  Recent Labs Lab 06/20/12 1914  TROPONINI <0.30   CBG:  Recent Labs Lab 06/23/12 1136 06/23/12 1707 06/23/12 2159 06/24/12 0557 06/24/12 1131  GLUCAP 291* 239* 129* 123*  225*    Recent Results (from the past 240 hour(s))  URINE CULTURE     Status: None   Collection Time    06/20/12  7:41 PM      Result Value Range Status   Specimen Description URINE, CLEAN CATCH   Final   Special Requests NONE   Final   Culture  Setup Time 06/21/2012 02:12   Final   Colony Count 6,000 COLONIES/ML   Final   Culture INSIGNIFICANT GROWTH   Final   Report Status 06/22/2012 FINAL   Final  MRSA PCR SCREENING     Status: Abnormal   Collection Time    06/21/12  6:03 AM      Result Value Range Status   MRSA by PCR POSITIVE (*) NEGATIVE Final   Comment:            The GeneXpert MRSA Assay (FDA     approved for NASAL specimens     only), is one component of a     comprehensive MRSA colonization     surveillance program. It is not     intended to diagnose MRSA     infection nor to guide or     monitor treatment for     MRSA infections.     RESULT CALLED TO, READ BACK BY AND VERIFIED WITH:     RIMMER,A RN 06/21/12 0855 WOOTEN,K    Studies: No results found. Scheduled Meds: . amLODipine  10 mg Oral Daily  . bimatoprost  1 drop Both Eyes QHS  . Chlorhexidine Gluconate Cloth  6 each Topical Q0600  . clopidogrel  75 mg Oral Q breakfast  . enoxaparin (LOVENOX) injection  40 mg Subcutaneous Q24H  . insulin aspart  0-9 Units Subcutaneous TID WC  . irbesartan  150 mg Oral Daily  . mupirocin ointment  1 application Nasal BID  . tamsulosin  0.4 mg Oral BID  . timolol  1 drop Both Eyes BID   Continuous Infusions:   Principal Problem:   CVA (cerebral infarction) Active Problems:   Carotid stenosis   HTN (hypertension)   DM (diabetes mellitus)   GERD (gastroesophageal reflux disease)  [name], MD [facility] Pager [phone]. If 7 PM - 7 AM, please contact night-coverage at [email or web address], password [facility] 06/24/2012, 2:45 PM  LOS: 4 days

## 2012-06-24 NOTE — Progress Notes (Signed)
Physical Therapy Treatment Patient Details Name: Ronald Hunt MRN: [id number] DOB: Apr 14, 1923 Today's Date: 06/24/2012 Time: 4098-1191 PT Time Calculation (min): 25 min  PT Assessment / Plan / Recommendation Comments on Treatment Session  Continue to recommend SNF; 24-7 (A)/(S) to maximize functional recovery & safety with mobility.   Pt able to increase ambulation distance today but still requires significant (A) for safe navigation due to vision impairment.      Follow Up Recommendations  SNF;Supervision/Assistance - 24 hour     Does the patient have the potential to tolerate intense rehabilitation     Barriers to Discharge        Equipment Recommendations  Other (comment) (TBD)    Recommendations for Other Services    Frequency Min 4X/week   Plan Discharge plan remains appropriate;Frequency remains appropriate    Precautions / Restrictions Precautions Precautions: Fall Precaution Comments: pt with noted vision impairment. minimal vision out of R eye Restrictions Weight Bearing Restrictions: No   Pertinent Vitals/Pain Pt denies pain.    Mobility  Bed Mobility Bed Mobility: Supine to Sit Supine to Sit: 5: Supervision;HOB flat Details for Bed Mobility Assistance: increased time Transfers Transfers: Sit to Stand;Stand to Sit Sit to Stand: 4: Min guard;With upper extremity assist;From bed Stand to Sit: 4: Min guard;With upper extremity assist;With armrests;To chair/3-in-1 Details for Transfer Assistance: cues for hand placement and safety. Ambulation/Gait Ambulation/Gait Assistance: 4: Min assist + max cueing for safe navigation.   Ambulation Distance (Feet): 300 Feet Assistive device: Rolling walker Ambulation/Gait Assistance Details: Cues to stay within RW.  (A) to avoid obstacles.  Darker colored floor tiles appear as steps to pt resulting in decreased gait and unsteadiness. Gait Pattern: Step-to pattern;Decreased stride length;Trunk flexed;Narrow base of  support Gait velocity: decreased General Gait Details: Able to increase gait velocity this session over white tiles, insecure over dark tiles and gait decreases significantly. Stairs: No    Exercises General Exercises - Lower Extremity Heel Slides: AROM;Both;10 reps;Seated (with leg rest up) Hip ABduction/ADduction: AROM;Strengthening;Both;10 reps;Seated (with leg rest up) Toe Raises: AROM;Strengthening;Both;20 reps;Seated Heel Raises: AROM;Both;20 reps;Seated     PT Goals Acute Rehab PT Goals Time For Goal Achievement: 06/28/12 Potential to Achieve Goals: Good Pt will go Supine/Side to Sit: with supervision;with HOB 0 degrees PT Goal: Supine/Side to Sit - Progress: Progressing toward goal Pt will go Sit to Stand: with supervision;with upper extremity assist PT Goal: Sit to Stand - Progress: Progressing toward goal Pt will Ambulate: 51 - 150 feet;with supervision;with least restrictive assistive device PT Goal: Ambulate - Progress: Progressing toward goal  Visit Information  Last PT Received On: 06/24/12 Assistance Needed: +1    Subjective Data  Subjective: Pt reported no pain - he just feels "not right"   Cognition  Cognition Overall Cognitive Status: Impaired Area of Impairment: Attention;Memory;Safety/judgement;Awareness of deficits Arousal/Alertness: Awake/alert Orientation Level: Appears intact for tasks assessed Behavior During Session: [facility] for tasks performed Current Attention Level: Sustained Safety/Judgement: Decreased awareness of need for assistance    Balance     End of Session PT - End of Session Equipment Utilized During Treatment: Gait belt Activity Tolerance: Patient tolerated treatment well Patient left: in chair;with call bell/phone within reach Nurse Communication: Mobility status   GP     Enid Baas, SPTA 06/24/2012, 9:16 AM  Verdell Face, PTA [phone] 06/24/2012

## 2012-06-24 NOTE — Clinical Social Work Note (Signed)
Clinical Social Work  CSW met with pt and provided list of bed offers. Pt is agreeable to [facility] at discharge. CSW contacted [facility] and confirmed bed availability. CSW will follow up with family regarding bed offers. CSW will continue to follow.   Dede Query, MSW, LCSW [phone]

## 2012-06-25 DIAGNOSIS — F0391 Unspecified dementia with behavioral disturbance: Secondary | ICD-10-CM | POA: Diagnosis present

## 2012-06-25 LAB — GLUCOSE, CAPILLARY
Glucose-Capillary: 146 mg/dL — ABNORMAL HIGH (ref 70–99)
Glucose-Capillary: 197 mg/dL — ABNORMAL HIGH (ref 70–99)
Glucose-Capillary: 218 mg/dL — ABNORMAL HIGH (ref 70–99)

## 2012-06-25 NOTE — Clinical Social Work Note (Signed)
Clincial Social Work   CSW met with pt's family to address discharge plan. Pt's family have chosen to go to [facility] at discharge. Anticipated discharge will be tomorrow (06/26/2012). CSW will continue to follow.   Dede Query, MSW, LCSW [phone]

## 2012-06-25 NOTE — Progress Notes (Signed)
Inpatient Diabetes Program Recommendations  AACE/ADA: New Consensus Statement on Inpatient Glycemic Control (2013)  Target Ranges:  Prepandial:   less than 140 mg/dL      Peak postprandial:   less than 180 mg/dL (1-2 hours)      Critically ill patients:  140 - 180 mg/dL  Results for JETT, FUKUDA (MRN [id number]) as of 06/25/2012 14:55  Ref. Range 06/24/2012 11:31 06/24/2012 16:43 06/24/2012 21:28 06/25/2012 07:12 06/25/2012 11:33  Glucose-Capillary Latest Range: 70-99 mg/dL 629 (H) 528 (H) 413 (H) 146 (H) 197 (H)    Inpatient Diabetes Program Recommendations Insulin - Basal: start home dose Lantus 10 units  Consider adding Novolog 4 units TID as meal coverage Thank you  Piedad Climes BSN, RN,CDE Inpatient Diabetes Coordinator [phone] (team pager)

## 2012-06-25 NOTE — Progress Notes (Signed)
Patient ID: Ronald Hunt  male  RUE:[id number]    DOB: 01/29/1924    DOA: 06/20/2012  PCP: Terald Sleeper, MD  Assessment/Plan: Principal Problem Slurred speech  - MRI negative for ac CVA, MRA showed moderate small vessel disease.  - Per neurology, may be related to cognitive impairment  - 2D ECHO showed EF 50-55% - carotid dopplers showed no significant carotid artery stenosis - No further inpatient neuro testing needed   Dementia  - patient has been more and more cognitively impaired over the past few months with episodes of hallucinations   Carotid stenosis:  Recent CEA   HTN;  Continue home meds.   DM:  SSI, Hba1c with poor control   GERD:  Continue home meds.   DVT Prophylaxis:  Code Status:  Disposition: pending SNF     Subjective: Pleasant cooperative, denies any specific complaints, waxing and waning mental status  Objective: Weight change:   Intake/Output Summary (Last 24 hours) at 06/25/12 1636 Last data filed at 06/25/12 0828  Gross per 24 hour  Intake    600 ml  Output   1375 ml  Net   -775 ml   Blood pressure 142/78, pulse 68, temperature 98.6 F (37 C), temperature source Oral, resp. rate 20, height 6\' 1"  (1.854 m), weight 76.6 kg (168 lb 14 oz), SpO2 98.00%.  Physical Exam: General: Alert and awake, oriented x3, not in any acute distress. CVS: S1-S2 clear, no murmur rubs or gallops Chest: clear to auscultation bilaterally, no wheezing, rales or rhonchi Abdomen: soft nontender, nondistended, normal bowel sounds Extremities: no cyanosis, clubbing or edema noted bilaterally Neuro: strength 5/5 in all 4 extremeties    Lab Results: Basic Metabolic Panel:  Recent Labs Lab 06/22/12 0526 06/23/12 0435  NA 139 142  K 3.3* 3.7  CL 102 106  CO2 27 25  GLUCOSE 180* 171*  BUN 22 23  CREATININE 1.21 1.22  CALCIUM 9.1 9.1   Liver Function Tests:  Recent Labs Lab 06/20/12 1828  AST 12  ALT 8  ALKPHOS 59  BILITOT 0.3  PROT 6.4   ALBUMIN 3.4*   No results found for this basename: LIPASE, AMYLASE,  in the last 168 hours No results found for this basename: AMMONIA,  in the last 168 hours CBC:  Recent Labs Lab 06/20/12 1828  WBC 6.8  HGB 11.5*  HCT 33.0*  MCV 85.5  PLT 135*   Cardiac Enzymes:  Recent Labs Lab 06/20/12 1914  TROPONINI <0.30   BNP: No components found with this basename: POCBNP,  CBG:  Recent Labs Lab 06/24/12 1131 06/24/12 1643 06/24/12 2128 06/25/12 0712 06/25/12 1133  GLUCAP 225* 268* 187* 146* 197*     Micro Results: Recent Results (from the past 240 hour(s))  URINE CULTURE     Status: None   Collection Time    06/20/12  7:41 PM      Result Value Range Status   Specimen Description URINE, CLEAN CATCH   Final   Special Requests NONE   Final   Culture  Setup Time 06/21/2012 02:12   Final   Colony Count 6,000 COLONIES/ML   Final   Culture INSIGNIFICANT GROWTH   Final   Report Status 06/22/2012 FINAL   Final  MRSA PCR SCREENING     Status: Abnormal   Collection Time    06/21/12  6:03 AM      Result Value Range Status   MRSA by PCR POSITIVE (*) NEGATIVE Final   Comment:  The GeneXpert MRSA Assay (FDA     approved for NASAL specimens     only), is one component of a     comprehensive MRSA colonization     surveillance program. It is not     intended to diagnose MRSA     infection nor to guide or     monitor treatment for     MRSA infections.     RESULT CALLED TO, READ BACK BY AND VERIFIED WITH:     RIMMER,A RN 06/21/12 0855 WOOTEN,K    Studies/Results: Dg Chest 2 View  06/20/2012  *RADIOLOGY REPORT*  Clinical Data: Chest pain.  CHEST - 2 VIEW  Comparison: 10/20/2011.  Findings: Normal sized heart.  Clear lungs.  Thoracic spine degenerative changes, including changes of DISH.  Stable calcified granuloma overlying the medial right lung base.  IMPRESSION: No acute abnormality.   Original Report Authenticated By: Beckie Salts, M.D.    Ct Head Wo  Contrast  06/20/2012  *RADIOLOGY REPORT*  Clinical Data: Progressive confusion, altered mental status, new since 06/19/2012  CT HEAD WITHOUT CONTRAST  Technique:  Contiguous axial images were obtained from the base of the skull through the vertex without contrast.  Comparison: 10/20/2011  Findings: Generalized atrophy. Normal ventricular morphology. No midline shift or mass effect. Small vessel chronic ischemic changes of deep cerebral white matter. Old left periventricular white matter infarct. No intracranial hemorrhage, mass lesion or evidence of acute infarction. No extra-axial fluid collections. Bones and sinuses unremarkable.  IMPRESSION: Atrophy with small vessel chronic ischemic changes of deep cerebral white matter. Old left parietal periventricular white matter infarct. No acute intracranial abnormalities.   Original Report Authenticated By: Ulyses Southward, M.D.    Mr Brain Wo Contrast  06/21/2012  *RADIOLOGY REPORT*  Clinical Data:  Speech difficulty.  The stroke.  MRI HEAD WITHOUT CONTRAST MRA HEAD WITHOUT CONTRAST  Technique:  Multiplanar, multiecho pulse sequences of the brain and surrounding structures were obtained without intravenous contrast. Angiographic images of the head were obtained using MRA technique without contrast.  Comparison:  CT head without contrast 06/20/2012.  MRI HEAD  Findings:  The diffusion weighted sequence demonstrates no evidence for acute or subacute infarction.  Advanced generalized atrophy and white matter disease is present.  Remote lacunar infarcts are noted in the corona radiata bilaterally, left greater than right.  Remote lacunar infarcts are present in the basal ganglia bilaterally as well.  No hemorrhage or mass lesion is present.  The ventricles are proportionate to the degree of atrophy.  Flow is present in the major intracranial arteries.  There is some ectasia of the right internal carotid artery.  The patient is status post scleral banding.  The right maxillary  sinus shrunken, compatible with a history of chronic disease.  No active disease is evident.  Minimal mucosal thickening is noted in the ethmoid air cells and frontal sinuses.  There is fluid in the right mastoid air cells.  No obstructing nasopharyngeal lesion is evident.  IMPRESSION:  1.  No acute intracranial abnormality. 2.  Age advanced atrophy and moderate white matter disease likely reflects the sequelae of chronic microvascular ischemia. 3.  Remote lacunar infarcts of the corona radiata and basal ganglia.  MRA HEAD  Findings: The cervical left internal carotid artery is tortuous. Mild irregularities are noted within the cavernous carotid arteries, right greater than left.  There is no significant stenosis relative to the distal vessel.  Mild moderate narrowing mild narrowing is present proximal left A1 segment.  The M1 segments are intact bilaterally.  There is focal signal loss at the MCA bifurcation bilaterally.  Moderate small vessel disease is evident bilaterally, left greater than right.  The left vertebral artery is dominant vessel.  The left PICA origin is below the field of view.  The basilar artery is narrowed, less than 50%.  Both posterior cerebral arteries originate from the basilar tip.  Moderate branch vessel attenuation is present bilaterally.  IMPRESSION:  1.  Moderate small vessel disease.  This is worse left than right in the MCA branch vessels. 2.  Mild diffuse narrowing of the basilar artery, less than 50%. 3.  Atherosclerotic changes within the cavernous carotid arteries bilaterally, right greater than left, without a significant stenosis.   Original Report Authenticated By: Marin Roberts, M.D.    Mr Mra Head/brain Wo Cm  06/21/2012  *RADIOLOGY REPORT*  Clinical Data:  Speech difficulty.  The stroke.  MRI HEAD WITHOUT CONTRAST MRA HEAD WITHOUT CONTRAST  Technique:  Multiplanar, multiecho pulse sequences of the brain and surrounding structures were obtained without intravenous  contrast. Angiographic images of the head were obtained using MRA technique without contrast.  Comparison:  CT head without contrast 06/20/2012.  MRI HEAD  Findings:  The diffusion weighted sequence demonstrates no evidence for acute or subacute infarction.  Advanced generalized atrophy and white matter disease is present.  Remote lacunar infarcts are noted in the corona radiata bilaterally, left greater than right.  Remote lacunar infarcts are present in the basal ganglia bilaterally as well.  No hemorrhage or mass lesion is present.  The ventricles are proportionate to the degree of atrophy.  Flow is present in the major intracranial arteries.  There is some ectasia of the right internal carotid artery.  The patient is status post scleral banding.  The right maxillary sinus shrunken, compatible with a history of chronic disease.  No active disease is evident.  Minimal mucosal thickening is noted in the ethmoid air cells and frontal sinuses.  There is fluid in the right mastoid air cells.  No obstructing nasopharyngeal lesion is evident.  IMPRESSION:  1.  No acute intracranial abnormality. 2.  Age advanced atrophy and moderate white matter disease likely reflects the sequelae of chronic microvascular ischemia. 3.  Remote lacunar infarcts of the corona radiata and basal ganglia.  MRA HEAD  Findings: The cervical left internal carotid artery is tortuous. Mild irregularities are noted within the cavernous carotid arteries, right greater than left.  There is no significant stenosis relative to the distal vessel.  Mild moderate narrowing mild narrowing is present proximal left A1 segment.  The M1 segments are intact bilaterally.  There is focal signal loss at the MCA bifurcation bilaterally.  Moderate small vessel disease is evident bilaterally, left greater than right.  The left vertebral artery is dominant vessel.  The left PICA origin is below the field of view.  The basilar artery is narrowed, less than 50%.  Both  posterior cerebral arteries originate from the basilar tip.  Moderate branch vessel attenuation is present bilaterally.  IMPRESSION:  1.  Moderate small vessel disease.  This is worse left than right in the MCA branch vessels. 2.  Mild diffuse narrowing of the basilar artery, less than 50%. 3.  Atherosclerotic changes within the cavernous carotid arteries bilaterally, right greater than left, without a significant stenosis.   Original Report Authenticated By: Marin Roberts, M.D.     Medications: Scheduled Meds: . amLODipine  10 mg Oral Daily  . bimatoprost  1 drop Both Eyes QHS  . clopidogrel  75 mg Oral Q breakfast  . enoxaparin (LOVENOX) injection  40 mg Subcutaneous Q24H  . insulin aspart  0-9 Units Subcutaneous TID WC  . irbesartan  150 mg Oral Daily  . mupirocin ointment  1 application Nasal BID  . tamsulosin  0.4 mg Oral BID  . timolol  1 drop Both Eyes BID      LOS: 5 days   Alexzander Dolinger M.D. [facility] 06/25/2012, 4:36 PM Pager: [phone]  If 7PM-7AM, please contact night-coverage [email or web address] Password [email or web address]

## 2012-06-25 NOTE — Progress Notes (Signed)
Physical Therapy Treatment Patient Details Name: Ronald Hunt MRN: [id number] DOB: 03-31-1923 Today's Date: 06/25/2012 Time: 0802-0825 PT Time Calculation (min): 23 min  PT Assessment / Plan / Recommendation Comments on Treatment Session  Pt pleasant & willing to participate in therapy this AM.   Ambulated without AD, required min (A) for balance & safety but max VC's for safe navigation.      Follow Up Recommendations  SNF;Supervision/Assistance - 24 hour     Does the patient have the potential to tolerate intense rehabilitation     Barriers to Discharge        Equipment Recommendations  Other (comment) (TBD)    Recommendations for Other Services    Frequency Min 4X/week   Plan Discharge plan remains appropriate;Frequency remains appropriate    Precautions / Restrictions Precautions Precautions: Fall Restrictions Weight Bearing Restrictions: No   Pertinent Vitals/Pain Denies pain.      Mobility  Bed Mobility Bed Mobility: Not assessed Transfers Transfers: Sit to Stand;Stand to Sit Sit to Stand: 4: Min guard;With upper extremity assist;With armrests;From chair/3-in-1 Stand to Sit: 4: Min guard;With upper extremity assist;With armrests;To chair/3-in-1 Details for Transfer Assistance: Guarding for safety due to pt mildly unsteady with initial static standing.   Ambulation/Gait Ambulation/Gait Assistance: 4: Min assist Ambulation Distance (Feet): 320 Feet Assistive device: 1 person hand held assist Ambulation/Gait Assistance Details: Max cueing for safe navigation through doorway, & around obstacles in environment.   Pt must slow down & grab ahold of rail in hall when he comes to tile color change in hallway.    Gait Pattern: Step-through pattern;Decreased stride length General Gait Details: Able to increase gait velocity this session over white tiles, insecure over dark tiles and gait decreases significantly. Stairs: No Naval architect Mobility:  No Modified Rankin (Stroke Patients Only) Pre-Morbid Rankin Score: No significant disability Modified Rankin: Moderately severe disability      PT Goals Acute Rehab PT Goals Time For Goal Achievement: 06/28/12 Potential to Achieve Goals: Good Pt will go Supine/Side to Sit: with supervision;with HOB 0 degrees Pt will go Sit to Supine/Side: with supervision Pt will go Sit to Stand: with supervision;with upper extremity assist PT Goal: Sit to Stand - Progress: Progressing toward goal Pt will Ambulate: 51 - 150 feet;with supervision;with least restrictive assistive device PT Goal: Ambulate - Progress: Progressing toward goal  Visit Information  Last PT Received On: 06/25/12 Assistance Needed: +1    Subjective Data      Cognition  Cognition Overall Cognitive Status: Impaired Area of Impairment: Memory;Safety/judgement Arousal/Alertness: Awake/alert Orientation Level: Appears intact for tasks assessed Behavior During Session: [name] for tasks performed Safety/Judgement: Decreased awareness of safety precautions;Decreased safety judgement for tasks assessed;Decreased awareness of need for assistance    Balance  Balance Balance Assessed: Yes Static Standing Balance Static Standing - Balance Support: No upper extremity supported Static Standing - Level of Assistance: 4: Min assist Static Standing - Comment/# of Minutes: Pt with anterior-posterior sway with static standing without UE support requiring Min (A) for balance.  Increased sway noted with standing with eyes closed & required increased (A) for balance.    End of Session PT - End of Session Equipment Utilized During Treatment: Gait belt Activity Tolerance: Patient tolerated treatment well Patient left: in chair;with call bell/phone within reach;Other (comment) Psychiatrist) Nurse Communication: Mobility status     Verdell Face, [place] [phone] 06/25/2012

## 2012-06-26 DIAGNOSIS — I10 Essential (primary) hypertension: Secondary | ICD-10-CM

## 2012-06-26 DIAGNOSIS — I6789 Other cerebrovascular disease: Secondary | ICD-10-CM

## 2012-06-26 DIAGNOSIS — E119 Type 2 diabetes mellitus without complications: Secondary | ICD-10-CM

## 2012-06-26 LAB — GLUCOSE, CAPILLARY: Glucose-Capillary: 219 mg/dL — ABNORMAL HIGH (ref 70–99)

## 2012-06-26 MED ORDER — INSULIN ASPART 100 UNIT/ML ~~LOC~~ SOLN: 0.0000 [IU] | Freq: Three times a day (TID) | SUBCUTANEOUS | Status: DC

## 2012-06-26 MED ORDER — QUETIAPINE 12.5 MG HALF TABLET: 12.5000 mg | ORAL_TABLET | Freq: Two times a day (BID) | ORAL | Status: DC | PRN

## 2012-06-26 MED ORDER — TIMOLOL MALEATE 0.5 % OP SOLN: 1.0000 [drp] | Freq: Two times a day (BID) | OPHTHALMIC | Status: DC

## 2012-06-26 MED ORDER — SENNOSIDES-DOCUSATE SODIUM 8.6-50 MG PO TABS: 1.0000 | ORAL_TABLET | Freq: Every evening | ORAL | Status: DC | PRN

## 2012-06-26 MED ORDER — INSULIN ASPART 100 UNIT/ML ~~LOC~~ SOLN
4.0000 [IU] | Freq: Three times a day (TID) | SUBCUTANEOUS | Status: DC
  Administered 2012-06-26: 4 [IU] via SUBCUTANEOUS

## 2012-06-26 NOTE — Discharge Summary (Signed)
Physician Discharge Summary  Patient ID: Ronald Hunt MRN: [id number] DOB/AGE: 77-Jan-1925 77 y.o.  Admit date: 06/20/2012 Discharge date: 06/26/2012  Primary Care Physician:  Terald Sleeper, MD  Discharge Diagnoses:    . Carotid stenosis . HTN (hypertension) . DM (diabetes mellitus) . GERD (gastroesophageal reflux disease) . CVA (cerebral infarction) . Dementia with behavioral disturbance  Consults:  Neurology, Dr. Cyril Mourning    Discharge Instructions: 1) Fall precautions  Discharge Medications:   Medication List    STOP taking these medications       insulin lispro 100 UNIT/ML injection  Commonly known as:  HUMALOG      TAKE these medications       acetaminophen 325 MG tablet  Commonly known as:  TYLENOL  Take 1 tablet (325 mg total) by mouth every 6 (six) hours as needed (or Fever >/= 101).     amLODipine 10 MG tablet  Commonly known as:  NORVASC  Take 10 mg by mouth daily.     BETIMOL 0.5 % ophthalmic solution  Generic drug:  timolol  Place 1 drop into both eyes 2 (two) times daily.     clopidogrel 75 MG tablet  Commonly known as:  PLAVIX  Take 1 tablet (75 mg total) by mouth daily with breakfast.     insulin aspart 100 UNIT/ML injection  Commonly known as:  novoLOG  Inject 0-9 Units into the skin 3 (three) times daily with meals. CBG 70 - 120: 0 units CBG 121 - 150: 1 unit,  CBG 151 - 200: 2 units,  CBG 201 - 250: 3 units,  CBG 251 - 300: 5 units,  CBG 301 - 350: 7 units,  CBG 351 - 400: 9 units   CBG > 400: 9 units and notify your MD     insulin glargine 100 UNIT/ML injection  Commonly known as:  LANTUS  Inject 10 Units into the skin at bedtime.     LUMIGAN 0.01 % Soln  Generic drug:  bimatoprost  Place 1 drop into both eyes at bedtime.     olmesartan 20 MG tablet  Commonly known as:  BENICAR  Take 1 tablet (20 mg total) by mouth daily.     PREPARATION H 0.25-3-14-71.9 % rectal ointment  Generic drug:  phenylephrine-shark liver oil-mineral  oil-petrolatum  Place 1 application rectally every 4 (four) hours as needed for hemorrhoids. For hemorrhoids     QUEtiapine 12.5 mg Tabs  Commonly known as:  SEROQUEL  Take 0.5 tablets (12.5 mg total) by mouth 2 (two) times daily as needed (agitation).     senna-docusate 8.6-50 MG per tablet  Commonly known as:  Senokot-S  Take 1 tablet by mouth at bedtime as needed.     tamsulosin 0.4 MG Caps  Commonly known as:  FLOMAX  Take 0.4 mg by mouth 2 (two) times daily.     timolol 0.5 % ophthalmic solution  Commonly known as:  TIMOPTIC  Place 1 drop into both eyes 2 (two) times daily.         Brief H and P: For complete details please refer to admission H and P, but in brief Ronald Hunt is an 79 y.o. male with a history of insulin dependent DM, TIA's s/p CEA who awakened on the day of admission with difficulty with speech. His son provided much of the history. Per family it seemed that he was at his baseline a day before and had gone to his doctor's appointment. He was able to  come back and eat normally. Gait was normal and he had his usual conversation with his sone by phone in the evening. The next morning, his speech was altered. At times it was jibberish. His gait seemed slow as well although he maintained the ability to ambulate. He is at baseline often uncooperative but today has been more easy-going than usual. He has had many of these type episodes in the past but they usually last about 15 minutes before resolving on their own. This particular event has been more protracted. Patient was admitted for further workup with concern of having a CVA.  Hospital Course:   Slurred speech:resolved, per neurology, this may be related to cognitive impairment due to worsening dementia. Patient was admitted for posterior workup. MRI was negative for acute CVA. MRA showed moderate small vessel disease. Neurology was consulted and felt that it is probably due to dementia and cognitive impairment  and no further inpatient neuro testing was needed. 2D ECHO showed EF 50-55%.  Carotid dopplers showed no significant carotid artery stenosis   Dementia - patient has been more and more cognitively impaired over the past few months with episodes of hallucinations. Patient's family also reported that this episode, he has been more confused than normal but they do feel that he's overall declining over the last few months.   Carotid stenosis: Recent CEA   HTN; Continue home meds.   DM:  patient was placed on sliding scale insulin, patient should resume Lantus and insulin. Poorly controlled with hemoglobin A1c of 10.8.   GERD:  Continue home meds.    Day of Discharge BP 110/57  Pulse 60  Temp(Src) 97.7 F (36.5 C) (Oral)  Resp 18  Ht 6\' 1"  (1.854 m)  Wt 76.6 kg (168 lb 14 oz)  BMI 22.28 kg/m2  SpO2 96%  Physical Exam: General: Alert and awake, sitting up in chair CVS: S1-S2 clear no murmur rubs or gallops Chest: clear to auscultation bilaterally Abdomen: soft nontender, nondistended, normal bowel sounds Extremities: no cyanosis, clubbing or edema noted bilaterally   The results of significant diagnostics from this hospitalization (including imaging, microbiology, ancillary and laboratory) are listed below for reference.    LAB RESULTS: Basic Metabolic Panel:  Recent Labs Lab 06/22/12 0526 06/23/12 0435  NA 139 142  K 3.3* 3.7  CL 102 106  CO2 27 25  GLUCOSE 180* 171*  BUN 22 23  CREATININE 1.21 1.22  CALCIUM 9.1 9.1   Liver Function Tests:  Recent Labs Lab 06/20/12 1828  AST 12  ALT 8  ALKPHOS 59  BILITOT 0.3  PROT 6.4  ALBUMIN 3.4*   No results found for this basename: LIPASE, AMYLASE,  in the last 168 hours No results found for this basename: AMMONIA,  in the last 168 hours CBC:  Recent Labs Lab 06/20/12 1828  WBC 6.8  HGB 11.5*  HCT 33.0*  MCV 85.5  PLT 135*   Cardiac Enzymes:  Recent Labs Lab 06/20/12 1914  TROPONINI <0.30   BNP: No  components found with this basename: POCBNP,  CBG:  Recent Labs Lab 06/26/12 0718 06/26/12 1131  GLUCAP 151* 219*    Significant Diagnostic Studies:  Dg Chest 2 View  06/20/2012   IMPRESSION: No acute abnormality.   Original Report Authenticated By: Beckie Salts, M.D.    Ct Head Wo Contrast  06/20/2012  IMPRESSION: Atrophy with small vessel chronic ischemic changes of deep cerebral white matter. Old left parietal periventricular white matter infarct. No acute intracranial abnormalities.  Original Report Authenticated By: Ulyses Southward, M.D.    Mr Brain Wo Contrast  06/21/2012   IMPRESSION:  1.  No acute intracranial abnormality. 2.  Age advanced atrophy and moderate white matter disease likely reflects the sequelae of chronic microvascular ischemia. 3.  Remote lacunar infarcts of the corona radiata and basal ganglia.  Mr Maxine Glenn Head/brain Wo Cm  06/21/2012    IMPRESSION:  1.  Moderate small vessel disease.  This is worse left than right in the MCA branch vessels. 2.  Mild diffuse narrowing of the basilar artery, less than 50%. 3.  Atherosclerotic changes within the cavernous carotid arteries bilaterally, right greater than left, without a significant stenosis.   Original Report Authenticated By: Marin Roberts, M.D.     2D ECHO: Procedures:  2D echo - Procedure narrative: Transthoracic echocardiography. Image quality was poor. - Left ventricle: The cavity size was normal. Wall thickness was increased in a pattern of moderate LVH. Systolic function was normal. The estimated ejection fraction was in the range of 50% to 55%. Regional wall motion abnormalities cannot be excluded. Doppler parameters are consistent with abnormal left ventricular relaxation (grade 1 diastolic dysfunction). - Aortic valve: Mild regurgitation. - Mitral valve: Mild regurgitation. Impressions:     Disposition and Follow-up:     Discharge Orders   Future Appointments Provider Department Dept Phone    10/09/2012 11:00 AM [facility] [phone]   10/09/2012 12:00 PM Evern Bio, NP [facility] [phone]   Future Orders Complete By Expires     Diet Carb Modified  As directed     Increase activity slowly  As directed         DISPOSITION: SNF DIET: Carb modified medium   ACTIVITY: As tolerated   DISCHARGE FOLLOW-UP Follow-up Information   Follow up with Terald Sleeper, MD. Schedule an appointment as soon as possible for a visit in 10 days. (for hospital follow-up)    Contact information:   [address] [place] [address] [phone]       Time spent on Discharge: 40 mins  Signed:   Lonie Newsham M.D. [facility] 06/26/2012, 11:33 AM Pager: [phone]

## 2012-06-26 NOTE — Progress Notes (Signed)
Speech Language Pathology Treatment Patient Details Name: Ronald Hunt MRN: [id number] DOB: 1923-07-26 Today's Date: 06/26/2012 Time: 7829-5621 SLP Time Calculation (min): 16 min  Assessment / Plan / Recommendation Clinical Impression  Pt seen for skilled cogling treatment today.  Pt sitting upright in chair required verbal and tactile cues to awaken.  Goals included orientation x4 and improving speech intelligibility.  Per RN, pt's spouse reported pt was sleepier today than previously but spouse not currently present.  Pt oriented to place with delayed responses and minimal verbal cues, oriented to date with maximum assist to read calendar.  Pt did not recall spouse visit earlier today after SLP provided direct question cue.   Speech was mildly dysarthric but intelligible.   Pt able to state need to call for assistance but required total assist to locate call bell in his chair.  Pt's current mental status (sleepiness) is impacting his rehabilitation.    Per RN, pt is to dc to SNF today, recommend trial SLP therapy for maximizing functional cognitive-linguistic skills in his SNF envirnoment.   Suspect mild dysfluencies and word finding deficits are secondary to previous parietal cva.      SLP Plan    Follow up at SNF for trial SLP treatment   Pertinent Vitals/Pain Afebrile, decreased  SLP Goals  SLP Goals SLP Goal #1 - Progress: Progressing toward goal SLP Goal #2 - Progress: Not met  General Temperature Spikes Noted: No Respiratory Status: Room air Behavior/Cognition: Lethargic;Distractible;Decreased sustained attention;Hard of hearing    Treatment Treatment focused on: Cognition;Dysarthria Skilled Treatment: verbal and visual cues, reorientaton, compensatory strategies   GO     Ronald Burnet, MS [facility] SLP [phone]

## 2012-06-26 NOTE — Clinical Social Work Note (Signed)
Clinical Social Work   Pt is ready for discharge to [facility]. Facility has received discharge summary and is ready to admit pt. Pt and family are agreeable with discharge plan. [facility] will provide transportation. CSW is signing off as no further needs identified.   Dede Query, MSW, LCSW [phone]

## 2012-07-01 ENCOUNTER — Non-Acute Institutional Stay (SKILLED_NURSING_FACILITY): Payer: Medicare Other | Admitting: Internal Medicine

## 2012-07-01 DIAGNOSIS — F0390 Unspecified dementia without behavioral disturbance: Secondary | ICD-10-CM

## 2012-07-01 DIAGNOSIS — I10 Essential (primary) hypertension: Secondary | ICD-10-CM

## 2012-07-01 DIAGNOSIS — R1031 Right lower quadrant pain: Secondary | ICD-10-CM

## 2012-07-01 DIAGNOSIS — IMO0001 Reserved for inherently not codable concepts without codable children: Secondary | ICD-10-CM

## 2012-07-01 DIAGNOSIS — E1159 Type 2 diabetes mellitus with other circulatory complications: Secondary | ICD-10-CM

## 2012-07-03 ENCOUNTER — Other Ambulatory Visit: Payer: Self-pay | Admitting: Internal Medicine

## 2012-07-03 DIAGNOSIS — R1030 Lower abdominal pain, unspecified: Secondary | ICD-10-CM

## 2012-07-07 ENCOUNTER — Ambulatory Visit
Admission: RE | Admit: 2012-07-07 | Discharge: 2012-07-07 | Disposition: A | Discharge status: Home / Self Care | Payer: Medicare Other | Source: Ambulatory Visit | Attending: Internal Medicine | Admitting: Internal Medicine

## 2012-07-07 DIAGNOSIS — R1030 Lower abdominal pain, unspecified: Secondary | ICD-10-CM

## 2012-07-07 MED ORDER — IOHEXOL 300 MG/ML  SOLN
75.0000 mL | Freq: Once | INTRAMUSCULAR | Status: AC | PRN
  Administered 2012-07-07: 75 mL via INTRAVENOUS

## 2012-07-16 NOTE — Progress Notes (Signed)
Patient ID: Ronald Hunt, male   DOB: 01-26-1924, 79 y.o.   MRN: [id number]        HISTORY & PHYSICAL  DATE:  07/01/2012  FACILITY: [facility]   LEVEL OF CARE: SNF   ALLERGIES:  No Known Allergies  CHIEF COMPLAINT:  Manage diabetes mellitus, hypertension and dementia.   HISTORY OF PRESENT ILLNESS:  79 year-old, Caucasian male was hospitalized secondary to difficulty with speech and, after hospitalization, he is admitted to this facility for short-term rehabilitation.  He has the following problems:  DM:pt's DM remains stable.  Pt denies polyuria, polydipsia, polyphagia, changes in vision or hypoglycemic episodes.  No complications noted from the medication presently being used.  Last hemoglobin A1c is: 10.8.  HTN: Pt 's HTN remains stable.  Denies CP, sob, DOE, pedal edema, headaches, dizziness or visual disturbances.  No complications from the medications currently being used.  Last BP : 123/69, 138/71, 129/73.  DEMENTIA: The dementia remains stable and continues to function adequately in the current living environment with supervision.  The patient has had little changes in behavior. No complications noted from the medications presently being used.  The patient has been cognitively impaired over the past few months with episodes of hallucinations, per family.  He is able to follow commands well today.    PAST MEDICAL HISTORY :  Past Medical History  Diagnosis Date  . Dizziness   . Macular degeneration   . History of TIAs   . Carotid artery occlusion   . Glaucoma   . Stroke     TIA  . Mitral valvular disorder   . Hypertension     takes Amlodipine and Benicar daily  . Heart murmur   . Hoarseness     states pretty much all the time  . Diabetes mellitus     takes Glimepiride daily  . GERD (gastroesophageal reflux disease)     doesn't require meds  . Constipation     occasionally Mag Citrate  . Urinary frequency   . Cancer     prostate;takes Flomax daily  . Nocturia    . Depression     but doesn't require meds  . Anxiety     doesn't take any meds  . Short-term memory loss   . Long-term memory loss   . Aortic insufficiency     mild-moderate AR by echo 07/2011 (Dr. Rennis Golden)  . Acute kidney failure, unspecified   . Urinary tract infection, site not specified   . Orthostatic hypotension   . Dementia   . Other symptoms involving cardiovascular system   . Unspecified transient cerebral ischemia   . Unspecified vitamin D deficiency   . Malignant neoplasm of prostate   . Unspecified glaucoma(365.9)   . Senile cataract, unspecified   . Unspecified essential hypertension   . Other abnormal blood chemistry   . Proteinuria     PAST SURGICAL HISTORY: Past Surgical History  Procedure Laterality Date  . Retinal detachment surgery    . Cataract extraction    . Endarterectomy  09/12/2011    Procedure: ENDARTERECTOMY CAROTID;  Surgeon: Sherren Kerns, MD;  Location: [facility] OR;  Service: Vascular;  Laterality: Right;  right carotid artery endarterectomy  with dacron patch angioplasty  . Carotid endarterectomy  09/12/11    RIGHT  cea    SOCIAL HISTORY:  reports that he has never smoked. He has never used smokeless tobacco. He reports that  drinks alcohol. He reports that he does not use illicit drugs.  FAMILY HISTORY:  Family History  Problem Relation Age of Onset  . Cancer Brother   . Crohn's disease Son     CURRENT MEDICATIONS: Reviewed per [facility]  REVIEW OF SYSTEMS:   GU:  Complains of right groin pain for three days.  Symptoms are intermittent.  Denies radiation.  Cannot identify precipitating or alleviating factors.    See HPI otherwise 14 point ROS is negative.  PHYSICAL EXAMINATION  VS:  T   99.1     P 72      RR 18      BP 123/69      POX%  98 room air      WT (Lb)  GENERAL: no acute distress, normal body habitus SKIN: warm & dry, no suspicious lesions or rashes, no excessive dryness EYES: conjunctivae normal, sclerae normal, normal eye  lids MOUTH/THROAT: lips without lesions,no lesions in the mouth,tongue is without lesions,uvula elevates in midline NECK: supple, trachea midline, no neck masses, no thyroid tenderness, no thyromegaly LYMPHATICS: no LAN in the neck, no supraclavicular LAN RESPIRATORY: breathing is even & unlabored, BS CTAB CARDIAC: RRR, no murmur,no extra heart sounds, no edema GI:  ABDOMEN: abdomen soft, normal BS, no masses, no tenderness  LIVER/SPLEEN: no hepatomegaly, no splenomegaly GU:  Right groin nontender to palpation.  No inflammation changes.  No hernia.  MUSCULOSKELETAL: HEAD: normal to inspection & palpation BACK: no kyphosis, scoliosis or spinal processes tenderness EXTREMITIES: LEFT UPPER EXTREMITY: full range of motion, normal strength & tone RIGHT UPPER EXTREMITY:  full range of motion, normal strength & tone LEFT LOWER EXTREMITY:  full range of motion, normal strength & tone RIGHT LOWER EXTREMITY:  full range of motion, normal strength & tone PSYCHIATRIC: the patient is alert & oriented to person, affect & behavior appropriate  LABS/RADIOLOGY: Glucose 171, otherwise BMP normal.    Albumin 3.4, otherwise liver profile normal.  Hemoglobin 11.5, MCV 85.5, platelets 135, white count 6.8.    Troponin-I less than 0.03.    Head CT:  No acute disease.  MRI of the head:  No acute findings.  MRA of the head showed moderate small vessel disease.    2D-echo showed EF 54-55%.    Carotid doppler shows no significant carotid artery stenosis.  ASSESSMENT/PLAN:  Diabetes mellitus with vascular complications.  Uncontrolled.  Monitor CBGs now.    Hypertension.  Well controlled.   Dementia.  Stable.   Right groin pain.   New problem.  No findings on exam.  We will check right groin ultrasound.  BPH.  Continue Flomax.    Check CBC and BMP.   I have reviewed patient's medical records received at admission/from hospitalization.  CPT CODE: 81191

## 2012-07-24 ENCOUNTER — Ambulatory Visit (INDEPENDENT_AMBULATORY_CARE_PROVIDER_SITE_OTHER): Payer: Self-pay | Admitting: General Surgery

## 2012-07-30 IMAGING — CT CT HEAD W/O CM
1 of 2 series · 15 of 30 positions shown, 19 images · non-contrast
Comparison: 06/18/2004 head CT and 06/07/2009 MRI

CLINICAL DATA: 88-year-old male with altered mental status and
confusion.

CT HEAD WITHOUT CONTRAST
TECHNIQUE: Contiguous axial images were obtained from the base of
the skull through the vertex without contrast.

[Series 3: recon 2: brain · axial · 0.47mm/px · z∈[+111,+264]mm · 15 of 64 slices shown, 19 images]
[im 4/64  brain]
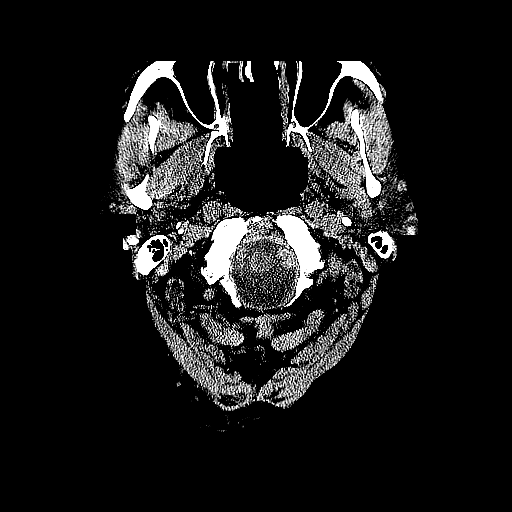
[im 4/64  bone]
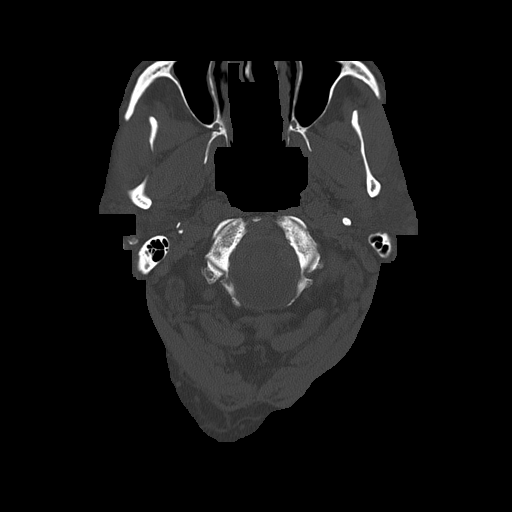
[im 7/64  brain]
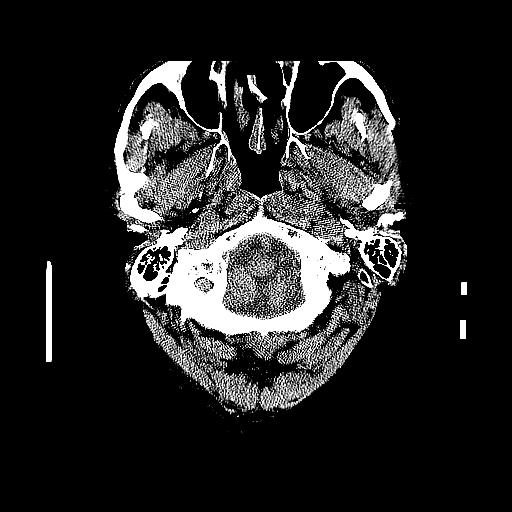
[im 14/64  brain]
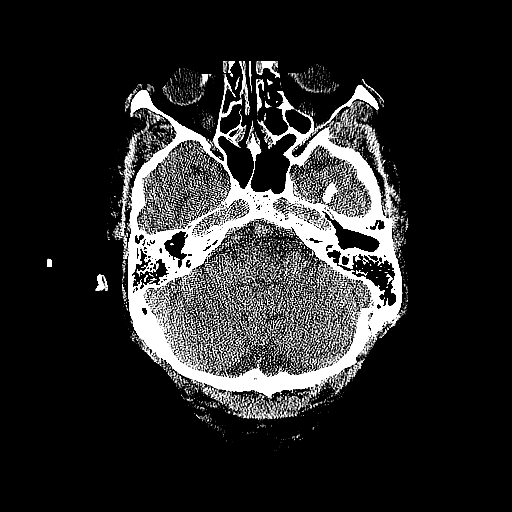
[im 17/64  brain]
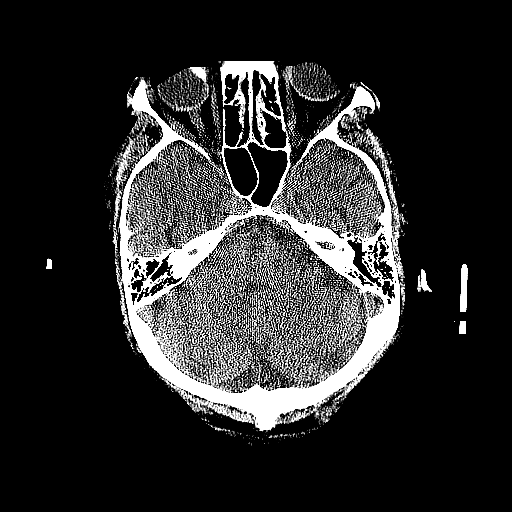
[im 20/64  brain]
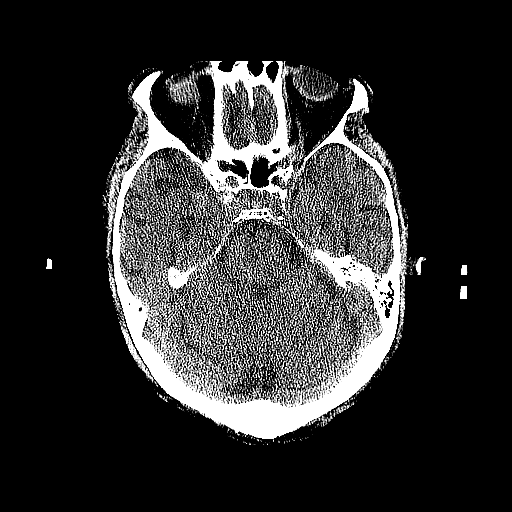
[im 20/64  bone]
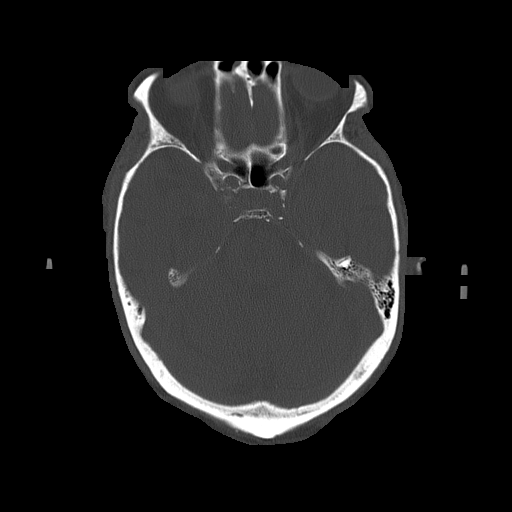
[im 24/64  brain]
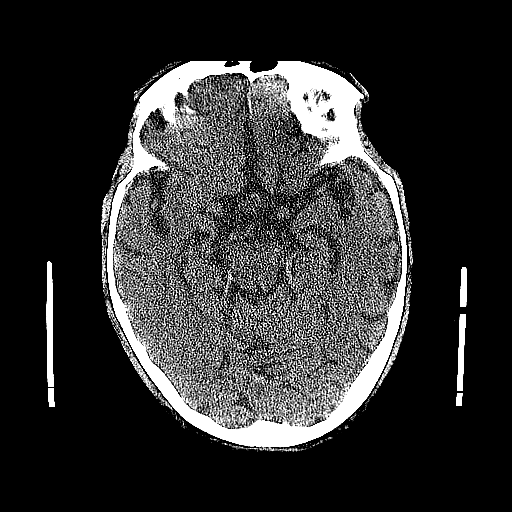
[im 27/64  brain]
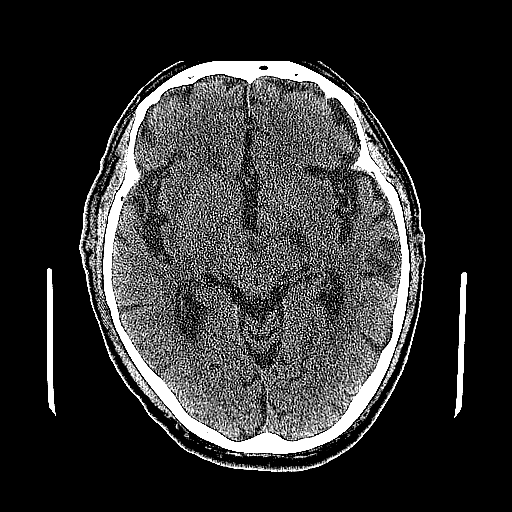
[im 34/64  brain]
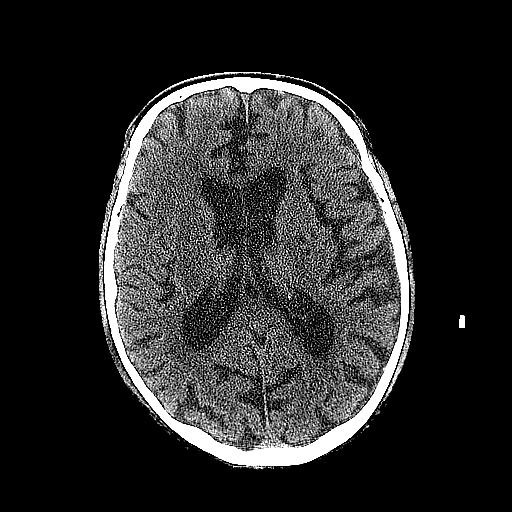
[im 37/64  brain]
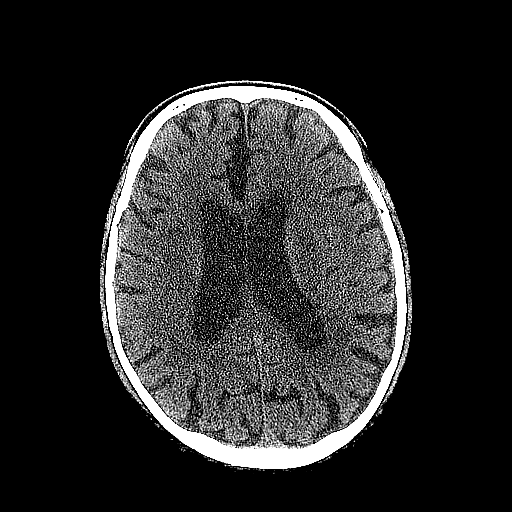
[im 37/64  bone]
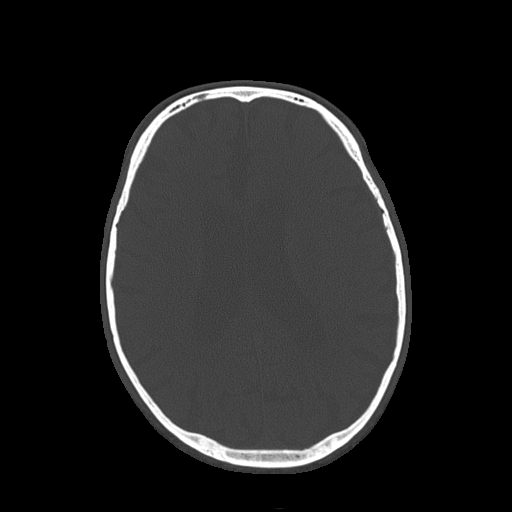
[im 40/64  brain]
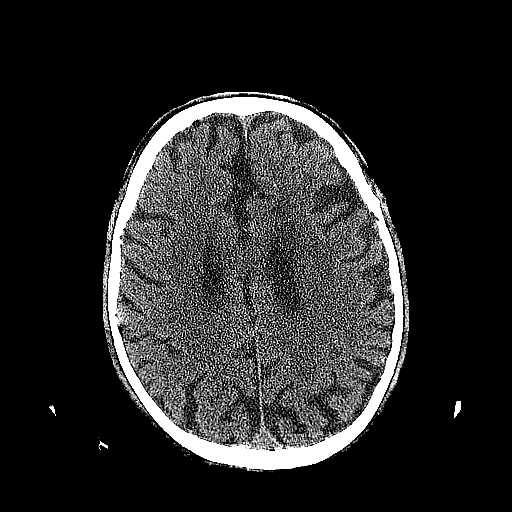
[im 44/64  brain]
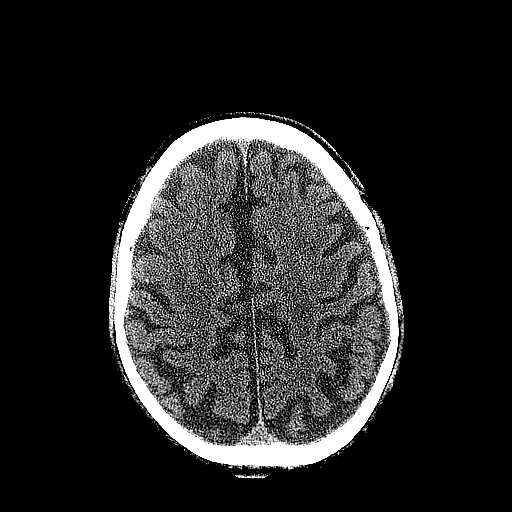
[im 47/64  brain]
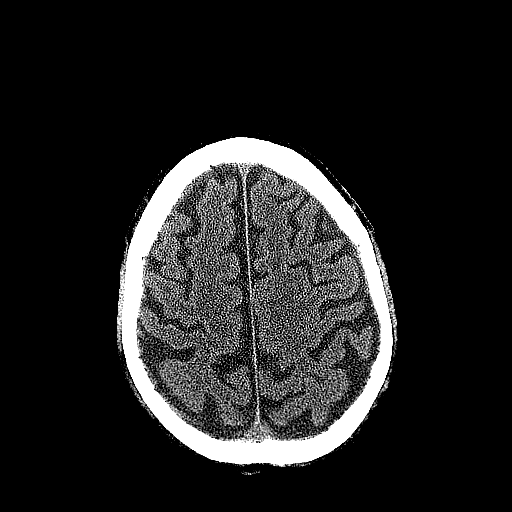
[im 54/64  brain]
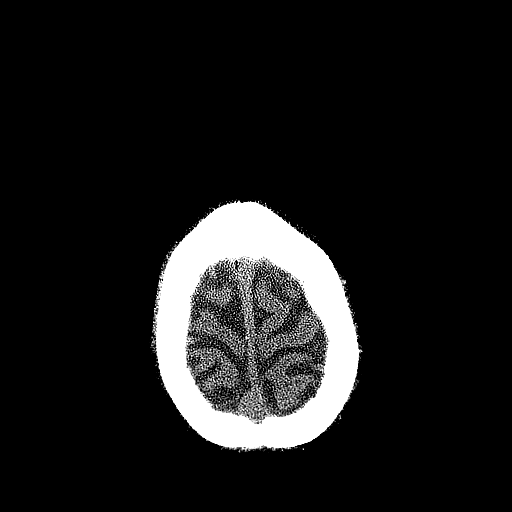
[im 54/64  bone]
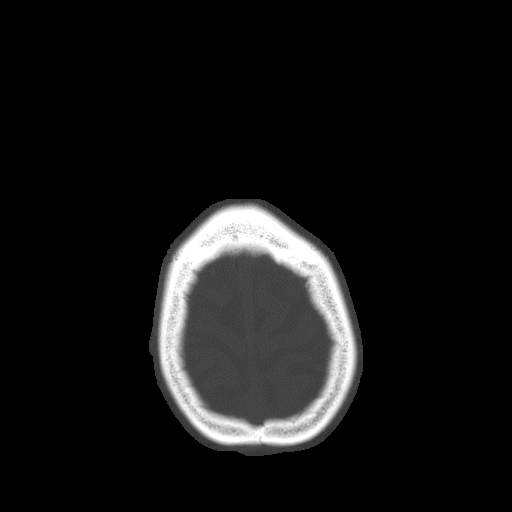
[im 57/64  brain]
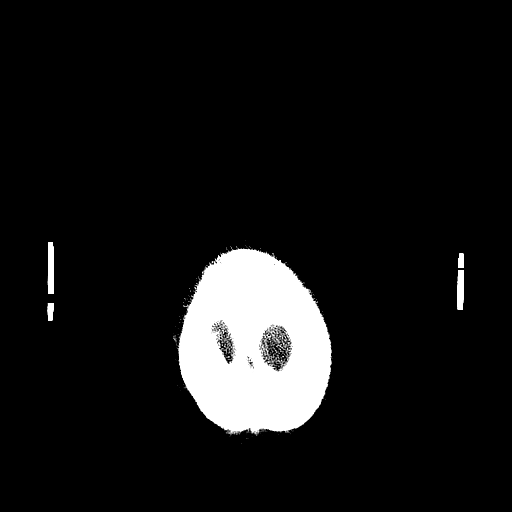
[im 60/64  brain]
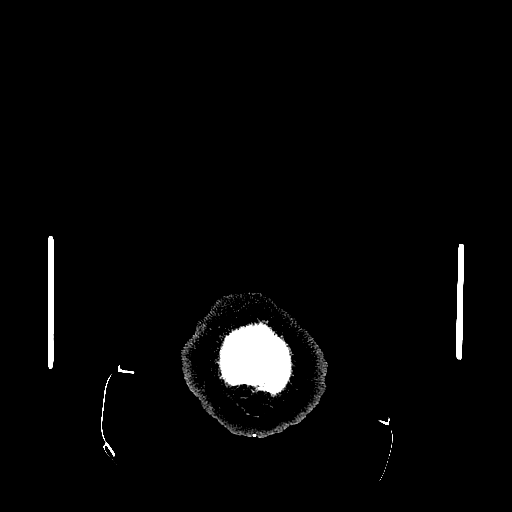

[15 of 30 positions shown; findings below may reference images not displayed]

FINDINGS: Atrophy and chronic small vessel white matter ischemic
changes are again noted.

Focal hypodensity in the left periventricular white matter (images
19 - 20) is new since 7885 and may represent chronic ischemic
changes or indeterminate age infarct.

Remote lacunar infarcts within the thalami bilaterally are noted.

No other intracranial abnormalities are identified, including mass
lesion or mass effect, hydrocephalus, extra-axial fluid collection,
midline shift, hemorrhage, or acute infarction.

The visualized bony calvarium is unremarkable.
IMPRESSION: Left periventricular white matter hypodensity - question chronic
ischemic changes or age indeterminate infarct.

Atrophy, chronic small vessel white matter ischemic changes and
remote thalamic infarcts.

## 2012-08-12 ENCOUNTER — Other Ambulatory Visit: Payer: Self-pay | Admitting: *Deleted

## 2012-08-12 MED ORDER — LOSARTAN POTASSIUM 50 MG PO TABS: 50.0000 mg | ORAL_TABLET | Freq: Every day | ORAL | Status: DC

## 2012-08-12 MED ORDER — CLOPIDOGREL BISULFATE 75 MG PO TABS: 75.0000 mg | ORAL_TABLET | Freq: Every day | ORAL | Status: AC

## 2012-09-08 ENCOUNTER — Other Ambulatory Visit: Payer: Self-pay | Admitting: Geriatric Medicine

## 2012-09-08 MED ORDER — INSULIN ASPART 100 UNIT/ML ~~LOC~~ SOLN: 0.0000 [IU] | Freq: Three times a day (TID) | SUBCUTANEOUS | Status: DC

## 2012-09-08 MED ORDER — AMLODIPINE BESYLATE 10 MG PO TABS: 10.0000 mg | ORAL_TABLET | Freq: Every day | ORAL | Status: AC

## 2012-09-08 MED ORDER — INSULIN GLARGINE 100 UNIT/ML ~~LOC~~ SOLN: 10.0000 [IU] | Freq: Every day | SUBCUTANEOUS | Status: DC

## 2012-09-30 ENCOUNTER — Other Ambulatory Visit: Payer: Self-pay | Admitting: Geriatric Medicine

## 2012-09-30 MED ORDER — TIMOLOL MALEATE 0.5 % OP SOLN: 1.0000 [drp] | Freq: Two times a day (BID) | OPHTHALMIC | Status: AC

## 2012-10-09 ENCOUNTER — Ambulatory Visit: Payer: Medicare Other | Admitting: Neurosurgery

## 2012-10-09 ENCOUNTER — Other Ambulatory Visit: Payer: Medicare Other

## 2012-10-20 ENCOUNTER — Other Ambulatory Visit: Payer: Self-pay | Admitting: Geriatric Medicine

## 2012-10-20 MED ORDER — INSULIN ASPART 100 UNIT/ML ~~LOC~~ SOLN: 0.0000 [IU] | Freq: Three times a day (TID) | SUBCUTANEOUS | Status: DC

## 2012-10-23 ENCOUNTER — Other Ambulatory Visit: Payer: Self-pay | Admitting: Geriatric Medicine

## 2012-10-23 MED ORDER — INSULIN GLARGINE 100 UNIT/ML ~~LOC~~ SOLN: 10.0000 [IU] | Freq: Every day | SUBCUTANEOUS | Status: DC

## 2012-10-29 ENCOUNTER — Other Ambulatory Visit: Payer: Self-pay | Admitting: Geriatric Medicine

## 2012-10-31 ENCOUNTER — Other Ambulatory Visit: Payer: Self-pay | Admitting: Geriatric Medicine

## 2012-11-20 ENCOUNTER — Ambulatory Visit: Payer: Medicare Other | Admitting: Vascular Surgery

## 2012-11-20 ENCOUNTER — Other Ambulatory Visit: Payer: Medicare Other

## 2012-11-27 ENCOUNTER — Ambulatory Visit: Payer: Medicare Other | Admitting: Family

## 2012-11-27 ENCOUNTER — Ambulatory Visit: Payer: Medicare Other | Admitting: Vascular Surgery

## 2012-11-27 ENCOUNTER — Other Ambulatory Visit: Payer: Medicare Other

## 2012-11-27 ENCOUNTER — Encounter: Payer: Self-pay | Admitting: Family

## 2012-11-28 ENCOUNTER — Encounter: Payer: Self-pay | Admitting: Family

## 2012-11-28 ENCOUNTER — Ambulatory Visit (INDEPENDENT_AMBULATORY_CARE_PROVIDER_SITE_OTHER): Payer: Medicare Other | Admitting: Family

## 2012-11-28 ENCOUNTER — Other Ambulatory Visit (INDEPENDENT_AMBULATORY_CARE_PROVIDER_SITE_OTHER): Payer: Medicare Other | Admitting: *Deleted

## 2012-11-28 DIAGNOSIS — I6529 Occlusion and stenosis of unspecified carotid artery: Secondary | ICD-10-CM

## 2012-11-28 DIAGNOSIS — Z48812 Encounter for surgical aftercare following surgery on the circulatory system: Secondary | ICD-10-CM

## 2012-11-28 NOTE — Progress Notes (Signed)
Established Carotid Patient  History of Present Illness  Ronald Hunt is a 79 y.o. male patient of Dr. Darrick Penna who status post right CEA in June 2013.  Son states his father has "white matter disease" in his brain but no new TIA or stroke sx's. Son also states he has less garbled speech since CEA. He denies claudication symptoms.  Patient has Negative recent history of TIA or stroke symptom.  The patient denies amaurosis fugax or monocular blindness.  The patient  denies facial drooping.  Pt. denies hemiplegia.  The patient denies receptive or expressive aphasia.  Pt. denies extremity weakness.  The patient's previous neurologic deficits are Unchanged.  Pt Diabetic: Yes Pt smoker: non smoker  Pt meds include: Statin : No: most recent lipid panel on file shows lipids at goal with LDL at 93, March, 2014. Betablocker: No ASA: No Other anticoagulants/antiplatelets: Plavix   Past Medical History  Diagnosis Date  . Dizziness   . Macular degeneration   . History of TIAs   . Carotid artery occlusion   . Glaucoma   . Stroke     TIA  . Mitral valvular disorder   . Hypertension     takes Amlodipine and Benicar daily  . Heart murmur   . Hoarseness     states pretty much all the time  . Diabetes mellitus     takes Glimepiride daily  . GERD (gastroesophageal reflux disease)     doesn't require meds  . Constipation     occasionally Mag Citrate  . Urinary frequency   . Cancer     prostate;takes Flomax daily  . Nocturia   . Depression     but doesn't require meds  . Anxiety     doesn't take any meds  . Short-term memory loss   . Long-term memory loss   . Aortic insufficiency     mild-moderate AR by echo 07/2011 (Dr. Rennis Golden)  . Acute kidney failure, unspecified   . Urinary tract infection, site not specified   . Orthostatic hypotension   . Dementia   . Other symptoms involving cardiovascular system   . Unspecified transient cerebral ischemia   . Unspecified vitamin D  deficiency   . Malignant neoplasm of prostate   . Unspecified glaucoma(365.9)   . Senile cataract, unspecified   . Unspecified essential hypertension   . Other abnormal blood chemistry   . Proteinuria     Social History History  Substance Use Topics  . Smoking status: Never Smoker   . Smokeless tobacco: Never Used  . Alcohol Use: Yes     Comment: occasionally beer 2-3 times/month    Family History Family History  Problem Relation Age of Onset  . Cancer Brother   . Crohn's disease Son     Surgical History Past Surgical History  Procedure Laterality Date  . Retinal detachment surgery    . Cataract extraction    . Endarterectomy  09/12/2011    Procedure: ENDARTERECTOMY CAROTID;  Surgeon: Sherren Kerns, MD;  Location: [facility] OR;  Service: Vascular;  Laterality: Right;  right carotid artery endarterectomy  with dacron patch angioplasty  . Carotid endarterectomy  09/12/11    RIGHT  cea    No Known Allergies  Current Outpatient Prescriptions  Medication Sig Dispense Refill  . amLODipine (NORVASC) 10 MG tablet Take 1 tablet (10 mg total) by mouth daily.  30 tablet  6  . BETIMOL 0.5 % ophthalmic solution Place 1 drop into both eyes 2 (two)  times daily.       . clopidogrel (PLAVIX) 75 MG tablet Take 1 tablet (75 mg total) by mouth daily with breakfast.  30 tablet  0  . insulin aspart (NOVOLOG) 100 UNIT/ML injection Inject 0-9 Units into the skin 3 (three) times daily with meals. CBG 70 - 120: 0 units CBG 121 - 150: 1 unit,  CBG 151 - 200: 2 units,  CBG 201 - 250: 3 units,  CBG 251 - 300: 5 units,  CBG 301 - 350: 7 units,  CBG 351 - 400: 9 units   CBG > 400: 9 units and notify your MD  1 vial  5  . insulin glargine (LANTUS) 100 UNIT/ML injection Inject 0.1 mLs (10 Units total) into the skin at bedtime.  10 mL  5  . losartan (COZAAR) 50 MG tablet Take 1 tablet (50 mg total) by mouth daily.  30 tablet  3  . LUMIGAN 0.01 % SOLN Place 1 drop into both eyes at bedtime.       Marland Kitchen olmesartan  (BENICAR) 20 MG tablet Take 1 tablet (20 mg total) by mouth daily.  30 tablet  0  . phenylephrine-shark liver oil-mineral oil-petrolatum (PREPARATION H) 0.25-3-14-71.9 % rectal ointment Place 1 application rectally every 4 (four) hours as needed for hemorrhoids. For hemorrhoids      . QUEtiapine (SEROQUEL) 12.5 mg TABS Take 0.5 tablets (12.5 mg total) by mouth 2 (two) times daily as needed (agitation).  30 tablet  0  . senna-docusate (SENOKOT-S) 8.6-50 MG per tablet Take 1 tablet by mouth at bedtime as needed.      . Tamsulosin HCl (FLOMAX) 0.4 MG CAPS Take 0.4 mg by mouth 2 (two) times daily.       . timolol (TIMOPTIC) 0.5 % ophthalmic solution Place 1 drop into both eyes 2 (two) times daily.  10 mL  3   No current facility-administered medications for this visit.    Review of Systems : [x]  Positive   [ ]  Denies  General:[ ]  Weight loss,  [ ]  Weight gain, [ ]  Loss of appetite, [ ]  Fever, [ ]  chills  Neurologic: [ ]  Dizziness, [ ]  Blackouts, [ ]  Headaches, [ ]  Seizure [ ]  Stroke, [ ]  "Mini stroke", [ ]  Slurred speech, [ ]  Temporary blindness;  [ ] weakness,  Ear/Nose/Throat: [ ]  Change in hearing, [ ]  Nose bleeds, [ ]  Hoarseness  Vascular:[ ]  Pain in legs with walking, [ ]  Pain in feet while lying flat , [ ]   Non-healing ulcer, [ ]  Blood clot in vein,    Pulmonary: [ ]  Home oxygen, [ ]   Productive cough, [ ]  Bronchitis, [ ]  Coughing up blood,  [ ]  Asthma, [ ]  Wheezing  Musculoskeletal:  [ ]  Arthritis, [ ]  Joint pain, [ ]  low back pain  Cardiac: [ ]  Chest pain, [ ]  Shortness of breath when lying flat, [ ]  Shortness of breath with exertion, [ ]  Palpitations, [ ]  Heart murmur, [ ]   Atrial fibrillation  Hematologic:[ ]  Easy Bruising, [ ]  Anemia; [ ]  Hepatitis  Psychiatric: [ ]   Depression, [ ]  Anxiety   Gastrointestinal: [ ]  Black stool, [ ]  Blood in stool, [ ]  Peptic ulcer disease,  [ ]  Gastroesophageal Reflux, [ ]  Trouble swallowing, [ ]  Diarrhea, [ ]  Constipation  Urinary: [ ]  chronic  Kidney disease, [ ]  on HD, [ ]  Burning with urination, Arly.Keller ] Frequent urination, [ ]  Difficulty urinating; Positive for hx of prostate cancer  Skin: [ ]  Rashes, [ ]  Wounds    Physical Examination  Filed Vitals:   11/28/12 0946  BP: 140/69  Pulse: 61  Resp:    Filed Weights   11/28/12 0943  Weight: 200 lb (90.719 kg)   Body mass index is 26.39 kg/(m^2).  General: WDWN male in NAD GAIT: mildly spastic Eyes: PERRLA Pulmonary:  CTAB, Negative  Rales, Negative rhonchi, & Negative wheezing.  Cardiac: regular Rhythm ,  Negative Murmurs, 2+ pretibial pitting edema bilaterally.  VASCULAR EXAM Carotid Bruits Left Right   Negative Negative                                                                                                                                LE Pulses LEFT RIGHT       FEMORAL  palpable   palpable        POPLITEAL  not palpable   not palpable       POSTERIOR TIBIAL  not palpable, audible with Doppler   not palpable, audible with Doppler         DORSALIS PEDIS      ANTERIOR TIBIAL not palpable, audible with Doppler   not palpable, audible with Doppler        Gastrointestinal: soft, nontender, BS WNL, no r/g,  negative masses.  Musculoskeletal: mild muscle atrophy/wasting, appropriate for age. M/S 5/5 throughout, Extremities without ischemic changes.  Neurologic: A&O X 3; Appropriate Affect ; SENSATION ;normal; Speech is fluent mildly aphasic, CN 2-12 intact, Pain and light touch intact in extremities, Motor exam as listed above.   Non-Invasive Vascular Imaging CAROTID DUPLEX 11/28/2012   Right ICA: Widely patent CEA site without evidence of hyperplasia or restenosis.  Left ICA <40% stenosis  CAROTID DUPLEX 04/10/2012  Right ICA 0 - 19% stenosis  Left ICA 20 - 39 % stenosis  These findings are Unchanged from previous exam  Assessment: LAWARENCE MEEK is a 79 y.o. male who presents for scheduled Duplex surveillance of carotid arteries status  post right CEA. He has no new symptoms of TIA or stroke, son states his speech is less garbled since CEA. The  ICA stenosis is  Unchanged from previous exam.   Plan: Follow-up in 1 year with Carotid Duplex scan.  I discussed in depth with the patient the nature of atherosclerosis, and emphasized the importance of maximal medical management including strict control of blood pressure, blood glucose, and lipid levels, obtaining regular exercise, and cessation of smoking.  The patient is aware that without maximal medical management the underlying atherosclerotic disease process will progress, limiting the benefit of any interventions. Pt was given information regarding stroke symptoms, action to take,  and prevention. Thank you for allowing [place] to participate in this patient's care.  Charisse March, RN, MSN, FNP-C [facility] Office: [phone]  Clinic Physician: Imogene Burn  11/28/2012 9:26 AM   VASCULAR QUALITY INITIATIVE FOLLOW UP DATA:  Current  smoker: [  ] yes  [ X ] no  Living status: [  ]  Home  Arly.Keller  ] Nursing home  [  ] Homeless    MEDS:  ASA [  ] yes  [ X ] no- [ X ] medical reason  [  ] non compliant  STATIN  [  ] yes  Arly.Keller  ] no- [ X ] medical reason  [  ] non compliant  Beta blocker [  ] yes  Arly.Keller  ] no- [  ] medical reason  [  ] non compliant  ACE inhibitor [  ] yes  Arly.Keller  ] no- Arly.Keller  ] medical reason (on ARB)  [  ] non compliant  P2Y12 Antagonist [  ] none  Arly.Keller  ] clopidogrel-Plavix  [  ] ticlopidine-Ticlid   [  ] prasugrel-Effient  [  ] ticagrelor- Brilinta    Anticoagulant Arly.Keller  ] None  [  ] warfarin  [  ] rivaroxaban-Xarelto [  ] dabigatran- Pradaxa  Neurologic event since D/C:  Arly.Keller  ] no  [  ] yes: [  ] eye event  [  ] cortical event  [  ] VB event  [  ] non specific event  [  ] right  [  ] left  [  ] TIA  [  ] stroke  Date:   Modified Rankin Score: 3

## 2012-12-10 ENCOUNTER — Encounter (INDEPENDENT_AMBULATORY_CARE_PROVIDER_SITE_OTHER): Payer: Self-pay | Admitting: Surgery

## 2012-12-10 ENCOUNTER — Ambulatory Visit (INDEPENDENT_AMBULATORY_CARE_PROVIDER_SITE_OTHER): Payer: Medicare Other | Admitting: Surgery

## 2012-12-10 VITALS — BP 110/60 | HR 68 | Resp 16 | Ht 73.0 in | Wt 197.2 lb

## 2012-12-10 DIAGNOSIS — K4090 Unilateral inguinal hernia, without obstruction or gangrene, not specified as recurrent: Secondary | ICD-10-CM

## 2012-12-10 NOTE — Progress Notes (Signed)
General Surgery [facility]  Chief Complaint  Patient presents with  . New Evaluation    eval [facility] - referral from Dr. Bjorn Pippin    HISTORY: Patient is a 79 year old male referred by his urologist for an enlarging, symptomatic, right inguinal hernia. Patient first developed pain in the right groin in February 2014. His son has noted an increasing bulge in the right groin. Patient denies any signs or symptoms of obstruction. He has had no prior abdominal surgery. He has undergone no prior hernia repairs. Patient presents today for evaluation and recommendations for management of enlarging right inguinal hernia.  Past Medical History  Diagnosis Date  . Dizziness   . Macular degeneration   . History of TIAs   . Carotid artery occlusion   . Glaucoma   . Stroke     TIA  . Mitral valvular disorder   . Hypertension     takes Amlodipine and Benicar daily  . Heart murmur   . Hoarseness     states pretty much all the time  . Diabetes mellitus     takes Glimepiride daily  . GERD (gastroesophageal reflux disease)     doesn't require meds  . Constipation     occasionally Mag Citrate  . Urinary frequency   . Cancer     prostate;takes Flomax daily  . Nocturia   . Depression     but doesn't require meds  . Anxiety     doesn't take any meds  . Short-term memory loss   . Long-term memory loss   . Aortic insufficiency     mild-moderate AR by echo 07/2011 (Dr. Rennis Golden)  . Acute kidney failure, unspecified   . Urinary tract infection, site not specified   . Orthostatic hypotension   . Dementia   . Other symptoms involving cardiovascular system   . Unspecified transient cerebral ischemia   . Unspecified vitamin D deficiency   . Malignant neoplasm of prostate   . Unspecified glaucoma(365.9)   . Senile cataract, unspecified   . Unspecified essential hypertension   . Other abnormal blood chemistry   . Proteinuria     Current Outpatient Prescriptions  Medication  Sig Dispense Refill  . amLODipine (NORVASC) 10 MG tablet Take 1 tablet (10 mg total) by mouth daily.  30 tablet  6  . BETIMOL 0.5 % ophthalmic solution Place 1 drop into both eyes 2 (two) times daily.       . clopidogrel (PLAVIX) 75 MG tablet Take 1 tablet (75 mg total) by mouth daily with breakfast.  30 tablet  0  . insulin aspart (NOVOLOG) 100 UNIT/ML injection Inject 0-9 Units into the skin 3 (three) times daily with meals. CBG 70 - 120: 0 units CBG 121 - 150: 1 unit,  CBG 151 - 200: 2 units,  CBG 201 - 250: 3 units,  CBG 251 - 300: 5 units,  CBG 301 - 350: 7 units,  CBG 351 - 400: 9 units   CBG > 400: 9 units and notify your MD  1 vial  5  . insulin glargine (LANTUS) 100 UNIT/ML injection Inject 0.1 mLs (10 Units total) into the skin at bedtime.  10 mL  5  . LORazepam (ATIVAN) 0.5 MG tablet       . losartan (COZAAR) 50 MG tablet Take 1 tablet (50 mg total) by mouth daily.  30 tablet  3  . LUMIGAN 0.01 % SOLN Place 1 drop into both eyes at bedtime.       Marland Kitchen  nystatin cream (MYCOSTATIN)       . olmesartan (BENICAR) 20 MG tablet Take 1 tablet (20 mg total) by mouth daily.  30 tablet  0  . phenylephrine-shark liver oil-mineral oil-petrolatum (PREPARATION H) 0.25-3-14-71.9 % rectal ointment Place 1 application rectally every 4 (four) hours as needed for hemorrhoids. For hemorrhoids      . QUEtiapine (SEROQUEL) 12.5 mg TABS Take 0.5 tablets (12.5 mg total) by mouth 2 (two) times daily as needed (agitation).  30 tablet  0  . senna-docusate (SENOKOT-S) 8.6-50 MG per tablet Take 1 tablet by mouth at bedtime as needed.      . Tamsulosin HCl (FLOMAX) 0.4 MG CAPS Take 0.4 mg by mouth 2 (two) times daily.       . timolol (TIMOPTIC) 0.5 % ophthalmic solution Place 1 drop into both eyes 2 (two) times daily.  10 mL  3   No current facility-administered medications for this visit.    No Known Allergies  Family History  Problem Relation Age of Onset  . Cancer Brother   . Crohn's disease Son     History    Social History  . Marital Status: Single    Spouse Name: N/A    Number of Children: N/A  . Years of Education: N/A   Social History Main Topics  . Smoking status: Never Smoker   . Smokeless tobacco: Never Used  . Alcohol Use: Yes     Comment: occasionally beer 2-3 times/month  . Drug Use: No  . Sexual Activity: No   Other Topics Concern  . None   Social History Narrative  . None    REVIEW OF SYSTEMS - PERTINENT POSITIVES ONLY: Denies signs or symptoms of obstruction. Hernia has been reducible. Intermittent moderate discomfort. Previous problems with bowel and bladder function following surgery.  EXAM: Filed Vitals:   12/10/12 1009  BP: 110/60  Pulse: 68  Resp: 16    HEENT: normocephalic; pupils equal and reactive; sclerae clear; dentition good; mucous membranes moist NECK:  symmetric on extension; no palpable anterior or posterior cervical lymphadenopathy; no supraclavicular masses; no tenderness; well-healed surgical incision on the right CHEST: clear to auscultation bilaterally without rales, rhonchi, or wheezes CARDIAC: regular rate and rhythm without significant murmur; peripheral pulses are full ABDOMEN: soft without distension; bowel sounds present; no mass; no hepatosplenomegaly GU:  Normal male genitalia without mass or lesion; yeast infection left groin; large indirect right inguinal hernia, reducible, mildly tender EXT:  non-tender without edema; no deformity NEURO: no gross focal deficits; no sign of tremor   LABORATORY RESULTS: See [facility] (CHL-Epic) for most recent results  RADIOLOGY RESULTS: See [facility] (CHL-Epic) for most recent results  IMPRESSION: Right inguinal hernia, indirect, reducible, symptomatic  PLAN: I had a lengthy discussion with the patient and his son. I provided him with written literature to review at home. I have recommended repair of a symptomatic right inguinal hernia using mesh. Given his previous problems  with anesthesia, I have recommended an overnight stay following his procedure. We have discussed risk and benefits of the procedure including the risk of recurrence, the risk of infection, and the risk of postoperative problems with bowel and bladder function. They understand and wish to proceed.  The risks and benefits of the procedure have been discussed at length with the patient.  The patient understands the proposed procedure, potential alternative treatments, and the course of recovery to be expected.  All of the patient's questions have been answered at this time.  The patient wishes to proceed with surgery.  Velora Heckler, MD, FACS General & Endocrine Surgery [facility]  Primary Care Physician: Terald Sleeper, MD

## 2012-12-10 NOTE — Patient Instructions (Signed)
[facility]  HERNIA REPAIR POST OP INSTRUCTIONS  Always review your discharge instruction sheet given to you by the facility where your surgery was performed.  1. A  prescription for pain medication may be given to you upon discharge.  Take your pain medication as prescribed.  If narcotic pain medicine is not needed, then you may take acetaminophen (Tylenol) or ibuprofen (Advil) as needed.  2. Take your usually prescribed medications unless otherwise directed.  3. If you need a refill on your pain medication, please contact your pharmacy.  They will contact our office to request authorization. Prescriptions will not be filled after 5 pm daily or on weekends.  4. You should follow a light diet the first 24 hours after arrival home, such as soup and crackers or toast.  Be sure to include plenty of fluids daily.  Resume your normal diet the day after surgery.  5. Most patients will experience some swelling and bruising around the surgical site.  Ice packs and reclining will help.  Swelling and bruising can take several days to resolve.   6. It is common to experience some constipation if taking pain medication after surgery.  Increasing fluid intake and taking a stool softener (such as Colace) will usually help or prevent this problem from occurring.  A mild laxative (Milk of Magnesia or Miralax) should be taken according to package directions if there are no bowel movements after 48 hours.  7. Unless discharge instructions indicate otherwise, you may remove your bandages 24-48 hours after surgery, and you may shower at that time.  You may have steri-strips (small skin tapes) in place directly over the incision.  These strips should be left on the skin for 7-10 days.  If your surgeon used skin glue on the incision, you may shower in 24 hours.  The glue will flake off over the next 2-3 weeks.  Any sutures or staples will be removed at the office during your follow-up  visit.  8. ACTIVITIES:  You may resume regular (light) daily activities beginning the next day-such as daily self-care, walking, climbing stairs-gradually increasing activities as tolerated.  You may have sexual intercourse when it is comfortable.  Refrain from any heavy lifting or straining until approved by your doctor.  You may drive when you are no longer taking prescription pain medication, you can comfortably wear a seatbelt, and you can safely maneuver your car and apply brakes.  9. You should see your doctor in the office for a follow-up appointment approximately 2-3 weeks after your surgery.  Make sure that you call for this appointment within a day or two after you arrive home to insure a convenient appointment time. 10.   WHEN TO CALL YOUR DOCTOR: 1. Fever greater than 101.0 2. Inability to urinate 3. Persistent nausea and/or vomiting 4. Extreme swelling or bruising 5. Continued bleeding from incision 6. Increased pain, redness, or drainage from the incision  The clinic staff is available to answer your questions during regular business hours.  Please don't hesitate to call and ask to speak to one of the nurses for clinical concerns.  If you have a medical emergency, go to the nearest emergency room or call 911.  A surgeon from [facility] is always on call for the hospital.   [facility] [address], [address], [place], [place]  [address]  [phone] ? [phone] ? FAX [phone]  [email or web address]   

## 2012-12-15 ENCOUNTER — Encounter (HOSPITAL_COMMUNITY): Payer: Self-pay | Admitting: Pharmacy Technician

## 2012-12-17 ENCOUNTER — Telehealth (INDEPENDENT_AMBULATORY_CARE_PROVIDER_SITE_OTHER): Payer: Self-pay | Admitting: General Surgery

## 2012-12-17 ENCOUNTER — Encounter (HOSPITAL_COMMUNITY)
Admission: RE | Admit: 2012-12-17 | Discharge: 2012-12-17 | Disposition: A | Discharge status: Home / Self Care | Payer: Medicare Other | Source: Ambulatory Visit | Attending: Surgery | Admitting: Surgery

## 2012-12-17 ENCOUNTER — Encounter (HOSPITAL_COMMUNITY): Payer: Self-pay

## 2012-12-17 HISTORY — DX: Other complications of anesthesia, initial encounter: T88.59XA

## 2012-12-17 HISTORY — DX: Adverse effect of unspecified anesthetic, initial encounter: T41.45XA

## 2012-12-17 LAB — BASIC METABOLIC PANEL
BUN: 23 mg/dL (ref 6–23)
CO2: 29 mEq/L (ref 19–32)
Chloride: 102 mEq/L (ref 96–112)
Creatinine, Ser: 1.49 mg/dL — ABNORMAL HIGH (ref 0.50–1.35)
Glucose, Bld: 267 mg/dL — ABNORMAL HIGH (ref 70–99)
Potassium: 4.1 mEq/L (ref 3.5–5.1)

## 2012-12-17 LAB — CBC
HCT: 38.3 % — ABNORMAL LOW (ref 39.0–52.0)
MCH: 29.7 pg (ref 26.0–34.0)
MCHC: 33.4 g/dL (ref 30.0–36.0)
MCV: 88.9 fL (ref 78.0–100.0)
RDW: 13.4 % (ref 11.5–15.5)
WBC: 7.5 10*3/uL (ref 4.0–10.5)

## 2012-12-17 NOTE — Telephone Encounter (Signed)
Crystal, at [facility], called to report pt's son left instructions to hold pt's Plavix (beginning today) and hold Insulin on DOS.  Their facility requires a signed MD order to alter the administration of medication.  Will also need "resume" orders when he comes back to them after same day surgery.  Please FAX to:  [phone].

## 2012-12-17 NOTE — Progress Notes (Signed)
Office called re: when to stop plavix. inst 5 days before . Patient will stop today(son will inst assisted living home)

## 2012-12-17 NOTE — Pre-Procedure Instructions (Addendum)
Ronald Hunt  12/17/2012   Your procedure is scheduled on:  12/19/12  [facility] at 645 AM.  Call this number if you have problems the morning of surgery: [phone]   Remember:   Do not eat food or drink liquids after midnight.   Take these medicines the morning of surgery with A SIP OF WATER: amlodipine,eye drops, ativan if needed, flomax            STOP   plavix  Per dr,  now   NO INSULIN AM OF SURGERY   Do not wear jewelry, make-up or nail polish.  Do not wear lotions, powders, or perfumes. You may wear deodorant.  Do not shave 48 hours prior to surgery. Men may shave face and neck.  Do not bring valuables to the hospital.  [facility] is not responsible                  for any belongings or valuables.               Contacts, dentures or bridgework may not be worn into surgery.  Leave suitcase in the car. After surgery it may be brought to your room.  For patients admitted to the hospital, discharge time is determined by your                treatment team.               Patients discharged the day of surgery will not be allowed to drive  home.  Name and phone number of your driver:   Special Instructions: Shower using CHG 2 nights before surgery and the night before surgery.  If you shower the day of surgery use CHG.  Use special wash - you have one bottle of CHG for all showers.  You should use approximately 1/3 of the bottle for each shower.   Please read over the following fact sheets that you were given: Pain Booklet, Coughing and Deep Breathing and Surgical Site Infection Prevention

## 2012-12-17 NOTE — Telephone Encounter (Signed)
Per request. Order signed by Dr Gerrit Friends for [stop plavix today,hold insulin DOS,Resume medications upon return to facility post op] faxed to [facility] at [facility]. Confirmation received.

## 2012-12-18 MED ORDER — CEFAZOLIN SODIUM-DEXTROSE 2-3 GM-% IV SOLR
2.0000 g | INTRAVENOUS | Status: AC
  Administered 2012-12-19: 2 g via INTRAVENOUS
  Filled 2012-12-18: qty 50

## 2012-12-19 ENCOUNTER — Encounter (HOSPITAL_COMMUNITY)
Admission: RE | Disposition: A | Discharge status: Home / Self Care | Payer: Self-pay | Source: Ambulatory Visit | Attending: Surgery

## 2012-12-19 ENCOUNTER — Ambulatory Visit (HOSPITAL_COMMUNITY)
Admission: RE | Admit: 2012-12-19 | Discharge: 2012-12-19 | Disposition: A | Discharge status: Home / Self Care | Payer: Medicare Other | Source: Ambulatory Visit | Attending: Surgery | Admitting: Surgery

## 2012-12-19 ENCOUNTER — Ambulatory Visit (HOSPITAL_COMMUNITY): Payer: Medicare Other | Admitting: Critical Care Medicine

## 2012-12-19 ENCOUNTER — Encounter (HOSPITAL_COMMUNITY): Payer: Self-pay | Admitting: Critical Care Medicine

## 2012-12-19 ENCOUNTER — Telehealth (INDEPENDENT_AMBULATORY_CARE_PROVIDER_SITE_OTHER): Payer: Self-pay

## 2012-12-19 DIAGNOSIS — Z79899 Other long term (current) drug therapy: Secondary | ICD-10-CM | POA: Insufficient documentation

## 2012-12-19 DIAGNOSIS — K4090 Unilateral inguinal hernia, without obstruction or gangrene, not specified as recurrent: Secondary | ICD-10-CM | POA: Diagnosis present

## 2012-12-19 DIAGNOSIS — Z794 Long term (current) use of insulin: Secondary | ICD-10-CM | POA: Insufficient documentation

## 2012-12-19 DIAGNOSIS — Z01812 Encounter for preprocedural laboratory examination: Secondary | ICD-10-CM | POA: Insufficient documentation

## 2012-12-19 DIAGNOSIS — I10 Essential (primary) hypertension: Secondary | ICD-10-CM | POA: Insufficient documentation

## 2012-12-19 DIAGNOSIS — E119 Type 2 diabetes mellitus without complications: Secondary | ICD-10-CM | POA: Insufficient documentation

## 2012-12-19 HISTORY — PX: INGUINAL HERNIA REPAIR: SHX194

## 2012-12-19 HISTORY — PX: INSERTION OF MESH: SHX5868

## 2012-12-19 LAB — GLUCOSE, CAPILLARY: Glucose-Capillary: 143 mg/dL — ABNORMAL HIGH (ref 70–99)

## 2012-12-19 LAB — SURGICAL PCR SCREEN: Staphylococcus aureus: POSITIVE — AB

## 2012-12-19 SURGERY — REPAIR, HERNIA, INGUINAL, ADULT
Anesthesia: General | Site: Groin | Laterality: Right | Wound class: Clean

## 2012-12-19 MED ORDER — OXYCODONE HCL 5 MG PO TABS: 5.0000 mg | ORAL_TABLET | Freq: Once | ORAL | Status: DC | PRN

## 2012-12-19 MED ORDER — MORPHINE SULFATE 2 MG/ML IJ SOLN: 1.0000 mg | INTRAMUSCULAR | Status: DC | PRN

## 2012-12-19 MED ORDER — FENTANYL CITRATE 0.05 MG/ML IJ SOLN
INTRAMUSCULAR | Status: DC | PRN
  Administered 2012-12-19: 75 ug via INTRAVENOUS

## 2012-12-19 MED ORDER — ROCURONIUM BROMIDE 100 MG/10ML IV SOLN
INTRAVENOUS | Status: DC | PRN
  Administered 2012-12-19: 40 mg via INTRAVENOUS

## 2012-12-19 MED ORDER — OXYCODONE HCL 5 MG/5ML PO SOLN: 5.0000 mg | Freq: Once | ORAL | Status: DC | PRN

## 2012-12-19 MED ORDER — NEOSTIGMINE METHYLSULFATE 1 MG/ML IJ SOLN
INTRAMUSCULAR | Status: DC | PRN
  Administered 2012-12-19: 4 mg via INTRAVENOUS

## 2012-12-19 MED ORDER — GLYCOPYRROLATE 0.2 MG/ML IJ SOLN
INTRAMUSCULAR | Status: DC | PRN
  Administered 2012-12-19: 0.6 mg via INTRAVENOUS

## 2012-12-19 MED ORDER — PHENYLEPHRINE HCL 10 MG/ML IJ SOLN
INTRAMUSCULAR | Status: DC | PRN
  Administered 2012-12-19 (×2): 40 ug via INTRAVENOUS

## 2012-12-19 MED ORDER — PROMETHAZINE HCL 25 MG/ML IJ SOLN: 6.2500 mg | INTRAMUSCULAR | Status: DC | PRN

## 2012-12-19 MED ORDER — EPHEDRINE SULFATE 50 MG/ML IJ SOLN
INTRAMUSCULAR | Status: DC | PRN
  Administered 2012-12-19 (×2): 10 mg via INTRAVENOUS

## 2012-12-19 MED ORDER — HYDROCODONE-ACETAMINOPHEN 5-325 MG PO TABS: 1.0000 | ORAL_TABLET | ORAL | Status: DC | PRN

## 2012-12-19 MED ORDER — LACTATED RINGERS IV SOLN
INTRAVENOUS | Status: DC
  Administered 2012-12-19: 08:00:00 via INTRAVENOUS

## 2012-12-19 MED ORDER — ONDANSETRON HCL 4 MG/2ML IJ SOLN
INTRAMUSCULAR | Status: DC | PRN
  Administered 2012-12-19: 4 mg via INTRAVENOUS

## 2012-12-19 MED ORDER — 0.9 % SODIUM CHLORIDE (POUR BTL) OPTIME
TOPICAL | Status: DC | PRN
  Administered 2012-12-19: 1000 mL

## 2012-12-19 MED ORDER — MUPIROCIN 2 % EX OINT
TOPICAL_OINTMENT | Freq: Two times a day (BID) | CUTANEOUS | Status: DC
  Filled 2012-12-19: qty 22

## 2012-12-19 MED ORDER — LIDOCAINE HCL (CARDIAC) 20 MG/ML IV SOLN
INTRAVENOUS | Status: DC | PRN
  Administered 2012-12-19: 30 mg via INTRAVENOUS

## 2012-12-19 MED ORDER — PROPOFOL 10 MG/ML IV BOLUS
INTRAVENOUS | Status: DC | PRN
  Administered 2012-12-19: 140 mg via INTRAVENOUS

## 2012-12-19 MED ORDER — MUPIROCIN 2 % EX OINT
TOPICAL_OINTMENT | CUTANEOUS | Status: AC
  Administered 2012-12-19: 08:00:00
  Filled 2012-12-19: qty 22

## 2012-12-19 MED ORDER — BUPIVACAINE HCL (PF) 0.25 % IJ SOLN
INTRAMUSCULAR | Status: AC
  Filled 2012-12-19: qty 30

## 2012-12-19 MED ORDER — BUPIVACAINE HCL (PF) 0.25 % IJ SOLN
INTRAMUSCULAR | Status: DC | PRN
  Administered 2012-12-19: 20 mL

## 2012-12-19 SURGICAL SUPPLY — 48 items
BENZOIN TINCTURE PRP APPL 2/3 (GAUZE/BANDAGES/DRESSINGS) ×3 IMPLANT
BLADE SURG 10 STRL SS (BLADE) ×3 IMPLANT
BLADE SURG 15 STRL LF DISP TIS (BLADE) ×2 IMPLANT
BLADE SURG 15 STRL SS (BLADE) ×1
BLADE SURG ROTATE 9660 (MISCELLANEOUS) IMPLANT
CANISTER SUCTION 2500CC (MISCELLANEOUS) IMPLANT
CHLORAPREP W/TINT 26ML (MISCELLANEOUS) ×3 IMPLANT
CLOTH BEACON ORANGE TIMEOUT ST (SAFETY) ×3 IMPLANT
COVER SURGICAL LIGHT HANDLE (MISCELLANEOUS) ×3 IMPLANT
DRAIN PENROSE 1/2X12 LTX STRL (WOUND CARE) IMPLANT
DRAPE LAPAROTOMY TRNSV 102X78 (DRAPE) ×3 IMPLANT
DRAPE UTILITY 15X26 W/TAPE STR (DRAPE) ×12 IMPLANT
ELECT CAUTERY BLADE 6.4 (BLADE) ×3 IMPLANT
ELECT REM PT RETURN 9FT ADLT (ELECTROSURGICAL) ×3
ELECTRODE REM PT RTRN 9FT ADLT (ELECTROSURGICAL) ×2 IMPLANT
GLOVE BIOGEL PI IND STRL 6.5 (GLOVE) ×2 IMPLANT
GLOVE BIOGEL PI IND STRL 7.0 (GLOVE) ×2 IMPLANT
GLOVE BIOGEL PI INDICATOR 6.5 (GLOVE) ×1
GLOVE BIOGEL PI INDICATOR 7.0 (GLOVE) ×1
GLOVE SURG ORTHO 8.0 STRL STRW (GLOVE) ×3 IMPLANT
GOWN STRL NON-REIN LRG LVL3 (GOWN DISPOSABLE) ×3 IMPLANT
GOWN STRL REIN XL XLG (GOWN DISPOSABLE) ×3 IMPLANT
KIT BASIN OR (CUSTOM PROCEDURE TRAY) ×3 IMPLANT
KIT ROOM TURNOVER OR (KITS) ×3 IMPLANT
MESH ULTRAPRO 3X6 7.6X15CM (Mesh General) ×3 IMPLANT
NEEDLE HYPO 25GX1X1/2 BEV (NEEDLE) ×3 IMPLANT
NS IRRIG 1000ML POUR BTL (IV SOLUTION) ×3 IMPLANT
PACK SURGICAL SETUP 50X90 (CUSTOM PROCEDURE TRAY) ×3 IMPLANT
PAD ARMBOARD 7.5X6 YLW CONV (MISCELLANEOUS) ×6 IMPLANT
PENCIL BUTTON HOLSTER BLD 10FT (ELECTRODE) ×3 IMPLANT
SPECIMEN JAR SMALL (MISCELLANEOUS) IMPLANT
SPONGE GAUZE 4X4 12PLY (GAUZE/BANDAGES/DRESSINGS) ×3 IMPLANT
SPONGE LAP 18X18 X RAY DECT (DISPOSABLE) ×3 IMPLANT
STRIP CLOSURE SKIN 1/2X4 (GAUZE/BANDAGES/DRESSINGS) ×3 IMPLANT
SUT MNCRL AB 4-0 PS2 18 (SUTURE) ×3 IMPLANT
SUT NOVA NAB GS-22 2 0 T19 (SUTURE) ×12 IMPLANT
SUT SILK 2 0 SH (SUTURE) IMPLANT
SUT SILK 3 0 (SUTURE) ×1
SUT SILK 3-0 18XBRD TIE 12 (SUTURE) ×2 IMPLANT
SUT VIC AB 3-0 SH 18 (SUTURE) ×3 IMPLANT
SYR BULB 3OZ (MISCELLANEOUS) ×3 IMPLANT
SYR CONTROL 10ML LL (SYRINGE) ×3 IMPLANT
TAPE CLOTH SURG 6X10 WHT LF (GAUZE/BANDAGES/DRESSINGS) ×3 IMPLANT
TOWEL OR 17X24 6PK STRL BLUE (TOWEL DISPOSABLE) ×3 IMPLANT
TOWEL OR 17X26 10 PK STRL BLUE (TOWEL DISPOSABLE) ×3 IMPLANT
TUBE CONNECTING 12X1/4 (SUCTIONS) IMPLANT
WATER STERILE IRR 1000ML POUR (IV SOLUTION) IMPLANT
YANKAUER SUCT BULB TIP NO VENT (SUCTIONS) IMPLANT

## 2012-12-19 NOTE — Telephone Encounter (Signed)
Pt's nursing facility calling to request a change to discharge pain medication instructions.  They need Norco Rx to read "take 1 tablet every 4 hours prn pain, #30 with no refills", instead of 1-2 q 4-6 hours."  Dr. Gerrit Friends to be paged for okay, then Rx to be faxed to [phone], Herbie Saxon, RN.

## 2012-12-19 NOTE — Op Note (Signed)
Inguinal Hernia, Open, Procedure Note  Pre-operative Diagnosis:  Right inguinal hernia, reducible  Post-operative Diagnosis: same  Surgeon:  Velora Heckler, MD, FACS  Anesthesia:  General  Indications: The patient presented with a right, reducible hernia.    Procedure Details  The patient was seen again in the Holding Room. The risks, benefits, complications, treatment options, and expected outcomes were discussed with the patient.  There was concurrence with the proposed plan, and informed consent was obtained. The site of surgery was properly noted/marked. The patient was taken to the Operating Room, identified by name, and the procedure verified as hernia repair. A Time Out was held and the above information confirmed.  The patient was placed in the supine position and underwent induction of anesthesia.  The lower abdomen and groin was prepped and draped in the usual strict aseptic fashion.  After ascertaining that an adequate level of anesthesia had been obtained, and incision is made in the groin with a #10 blade.  Dissection is carried through the subcutaneous tissues and hemostasis obtained with the electrocautery.  A Gelpi retractor is placed for exposure.  The external oblique fascia is incised in line with it's fibers and extended through the external inguinal ring.  The cord structures are dissected out of the inguinal canal and encircled with a Penrose drain.  The floor of the inguinal canal is dissected out.  The cord is explored and a large indirect inguinal hernia sac is dissected out up to the level of the internal inguinal ring.  A high ligation of the sac is performed with a 2-0 silk suture ligature, and the sac is excised and discarded.  The floor of the inguinal canal is reconstructed with a sheet of mesh cut to the appropriate dimensions.  It is secured to the pubic tubercle with a 2-0 Novafil suture and along the inguinal ligament with a running 2-0 Novafil suture.  Mesh is  split to accommodate the cord structures.  The superior edge of the mesh is secured to the transversalis and internal oblique muscles with interrupted 2-0 Novafil sutures.  The tails of the mesh are overlapped lateral to the cord structures and secured to the inguinal ligament with interrupted 2-0 Novafil sutures to recreate the internal inguinal ring.  Cord structures are returned to the inguinal canal.  Local anesthetic is infiltrated throughout the field.  External oblique fascia is closed with interrupted 3-0 Vicryl sutures.  Subcutaneous tissues are closed with interrupted 3-0 Vicryl sutures.  Skin is anesthetized with local anesthetic, and the skin edges re-approximated with a running 4-0 Monocryl suture.  Wound is washed and dried and benzoin and steristrips are applied.  A gauze dressing is then applied.  Instrument, sponge, and needle counts were correct prior to closure and at the conclusion of the case.  Velora Heckler, MD, FACS General & Endocrine Surgery [facility]   Findings: Hernia as above  Estimated Blood Loss: Minimal         Specimens: None  Complications: None; patient tolerated the procedure well.         Disposition: PACU - hemodynamically stable.         Condition: stable

## 2012-12-19 NOTE — H&P (View-Only) (Signed)
General Surgery - [facility]  Chief Complaint  Patient presents with  . New Evaluation    eval [facility] - referral from Dr. John Wrenn    HISTORY: Patient is a 79-year-old male referred by his urologist for an enlarging, symptomatic, right inguinal hernia. Patient first developed pain in the right groin in February 2014. His son has noted an increasing bulge in the right groin. Patient denies any signs or symptoms of obstruction. He has had no prior abdominal surgery. He has undergone no prior hernia repairs. Patient presents today for evaluation and recommendations for management of enlarging right inguinal hernia.  Past Medical History  Diagnosis Date  . Dizziness   . Macular degeneration   . History of TIAs   . Carotid artery occlusion   . Glaucoma   . Stroke     TIA  . Mitral valvular disorder   . Hypertension     takes Amlodipine and Benicar daily  . Heart murmur   . Hoarseness     states pretty much all the time  . Diabetes mellitus     takes Glimepiride daily  . GERD (gastroesophageal reflux disease)     doesn't require meds  . Constipation     occasionally Mag Citrate  . Urinary frequency   . Cancer     prostate;takes Flomax daily  . Nocturia   . Depression     but doesn't require meds  . Anxiety     doesn't take any meds  . Short-term memory loss   . Long-term memory loss   . Aortic insufficiency     mild-moderate AR by echo 07/2011 (Dr. Hilty)  . Acute kidney failure, unspecified   . Urinary tract infection, site not specified   . Orthostatic hypotension   . Dementia   . Other symptoms involving cardiovascular system   . Unspecified transient cerebral ischemia   . Unspecified vitamin D deficiency   . Malignant neoplasm of prostate   . Unspecified glaucoma(365.9)   . Senile cataract, unspecified   . Unspecified essential hypertension   . Other abnormal blood chemistry   . Proteinuria     Current Outpatient Prescriptions  Medication  Sig Dispense Refill  . amLODipine (NORVASC) 10 MG tablet Take 1 tablet (10 mg total) by mouth daily.  30 tablet  6  . BETIMOL 0.5 % ophthalmic solution Place 1 drop into both eyes 2 (two) times daily.       . clopidogrel (PLAVIX) 75 MG tablet Take 1 tablet (75 mg total) by mouth daily with breakfast.  30 tablet  0  . insulin aspart (NOVOLOG) 100 UNIT/ML injection Inject 0-9 Units into the skin 3 (three) times daily with meals. CBG 70 - 120: 0 units CBG 121 - 150: 1 unit,  CBG 151 - 200: 2 units,  CBG 201 - 250: 3 units,  CBG 251 - 300: 5 units,  CBG 301 - 350: 7 units,  CBG 351 - 400: 9 units   CBG > 400: 9 units and notify your MD  1 vial  5  . insulin glargine (LANTUS) 100 UNIT/ML injection Inject 0.1 mLs (10 Units total) into the skin at bedtime.  10 mL  5  . LORazepam (ATIVAN) 0.5 MG tablet       . losartan (COZAAR) 50 MG tablet Take 1 tablet (50 mg total) by mouth daily.  30 tablet  3  . LUMIGAN 0.01 % SOLN Place 1 drop into both eyes at bedtime.       .   nystatin cream (MYCOSTATIN)       . olmesartan (BENICAR) 20 MG tablet Take 1 tablet (20 mg total) by mouth daily.  30 tablet  0  . phenylephrine-shark liver oil-mineral oil-petrolatum (PREPARATION H) 0.25-3-14-71.9 % rectal ointment Place 1 application rectally every 4 (four) hours as needed for hemorrhoids. For hemorrhoids      . QUEtiapine (SEROQUEL) 12.5 mg TABS Take 0.5 tablets (12.5 mg total) by mouth 2 (two) times daily as needed (agitation).  30 tablet  0  . senna-docusate (SENOKOT-S) 8.6-50 MG per tablet Take 1 tablet by mouth at bedtime as needed.      . Tamsulosin HCl (FLOMAX) 0.4 MG CAPS Take 0.4 mg by mouth 2 (two) times daily.       . timolol (TIMOPTIC) 0.5 % ophthalmic solution Place 1 drop into both eyes 2 (two) times daily.  10 mL  3   No current facility-administered medications for this visit.    No Known Allergies  Family History  Problem Relation Age of Onset  . Cancer Brother   . Crohn's disease Son     History    Social History  . Marital Status: Single    Spouse Name: N/A    Number of Children: N/A  . Years of Education: N/A   Social History Main Topics  . Smoking status: Never Smoker   . Smokeless tobacco: Never Used  . Alcohol Use: Yes     Comment: occasionally beer 2-3 times/month  . Drug Use: No  . Sexual Activity: No   Other Topics Concern  . None   Social History Narrative  . None    REVIEW OF SYSTEMS - PERTINENT POSITIVES ONLY: Denies signs or symptoms of obstruction. Hernia has been reducible. Intermittent moderate discomfort. Previous problems with bowel and bladder function following surgery.  EXAM: Filed Vitals:   12/10/12 1009  BP: 110/60  Pulse: 68  Resp: 16    HEENT: normocephalic; pupils equal and reactive; sclerae clear; dentition good; mucous membranes moist NECK:  symmetric on extension; no palpable anterior or posterior cervical lymphadenopathy; no supraclavicular masses; no tenderness; well-healed surgical incision on the right CHEST: clear to auscultation bilaterally without rales, rhonchi, or wheezes CARDIAC: regular rate and rhythm without significant murmur; peripheral pulses are full ABDOMEN: soft without distension; bowel sounds present; no mass; no hepatosplenomegaly GU:  Normal male genitalia without mass or lesion; yeast infection left groin; large indirect right inguinal hernia, reducible, mildly tender EXT:  non-tender without edema; no deformity NEURO: no gross focal deficits; no sign of tremor   LABORATORY RESULTS: See [facility] (CHL-Epic) for most recent results  RADIOLOGY RESULTS: See [facility] (CHL-Epic) for most recent results  IMPRESSION: Right inguinal hernia, indirect, reducible, symptomatic  PLAN: I had a lengthy discussion with the patient and his son. I provided him with written literature to review at home. I have recommended repair of a symptomatic right inguinal hernia using mesh. Given his previous problems  with anesthesia, I have recommended an overnight stay following his procedure. We have discussed risk and benefits of the procedure including the risk of recurrence, the risk of infection, and the risk of postoperative problems with bowel and bladder function. They understand and wish to proceed.  The risks and benefits of the procedure have been discussed at length with the patient.  The patient understands the proposed procedure, potential alternative treatments, and the course of recovery to be expected.  All of the patient's questions have been answered at this time.    The patient wishes to proceed with surgery.  Nevaeh Casillas M. Hoa Briggs, MD, FACS General & Endocrine Surgery [facility]  Primary Care Physician: ROBSON,MICHAEL GAVIN, MD   

## 2012-12-19 NOTE — Interval H&P Note (Signed)
History and Physical Interval Note:  12/19/2012 8:42 AM  Ronald Hunt  has presented today for surgery, with the diagnosis of right inguinal hernia.  The various methods of treatment have been discussed with the patient and family. After consideration of risks, benefits and other options for treatment, the patient has consented to    Procedure(s): REPAIR RIGHT INGUINAL HERNIA  (Right) INSERTION OF MESH (N/A) as a surgical intervention .    The patient's history has been reviewed, patient examined, no change in status, stable for surgery.  I have reviewed the patient's chart and labs.  Questions were answered to the patient's satisfaction.    Velora Heckler, MD, [facility] Office: [phone]    Deniece Rankin Judie Petit

## 2012-12-19 NOTE — Anesthesia Procedure Notes (Signed)
Procedure Name: Intubation Date/Time: 12/19/2012 8:50 AM Performed by: Elon Alas Pre-anesthesia Checklist: Patient identified, Timeout performed, Emergency Drugs available, Suction available and Patient being monitored Patient Re-evaluated:Patient Re-evaluated prior to inductionOxygen Delivery Method: Circle system utilized Preoxygenation: Pre-oxygenation with 100% oxygen Intubation Type: IV induction Ventilation: Mask ventilation without difficulty and Oral airway inserted - appropriate to patient size Laryngoscope Size: Miller and 3 Grade View: Grade I Tube type: Oral Tube size: 7.5 mm Number of attempts: 1 Airway Equipment and Method: Stylet Placement Confirmation: positive ETCO2,  ETT inserted through vocal cords under direct vision and breath sounds checked- equal and bilateral Secured at: 24 cm Tube secured with: Tape Dental Injury: Teeth and Oropharynx as per pre-operative assessment

## 2012-12-19 NOTE — Transfer of Care (Signed)
Immediate Anesthesia Transfer of Care Note  Patient: Ronald Hunt  Procedure(s) Performed: Procedure(s): REPAIR RIGHT INGUINAL HERNIA  (Right) INSERTION OF MESH (N/A)  Patient Location: PACU  Anesthesia Type:General  Level of Consciousness: awake, alert  and oriented  Airway & Oxygen Therapy: Patient Spontanous Breathing and Patient connected to nasal cannula oxygen  Post-op Assessment: Report given to PACU RN, Post -op Vital signs reviewed and stable and Patient moving all extremities X 4  Post vital signs: Reviewed and stable  Complications: No apparent anesthesia complications

## 2012-12-19 NOTE — Anesthesia Postprocedure Evaluation (Signed)
  Anesthesia Post-op Note  Patient: Ronald Hunt  Procedure(s) Performed: Procedure(s): REPAIR RIGHT INGUINAL HERNIA  (Right) INSERTION OF MESH (N/A)  Patient Location: PACU  Anesthesia Type:General  Level of Consciousness: awake and alert   Airway and Oxygen Therapy: Patient Spontanous Breathing  Post-op Pain: mild  Post-op Assessment: Post-op Vital signs reviewed  Post-op Vital Signs: stable  Complications: No apparent anesthesia complications

## 2012-12-19 NOTE — Preoperative (Signed)
Beta Blockers   Reason not to administer Beta Blockers:Not Applicable 

## 2012-12-19 NOTE — Anesthesia Preprocedure Evaluation (Addendum)
Anesthesia Evaluation  Patient identified by MRN, date of birth, ID band Patient awake    Reviewed: Allergy & Precautions, H&P , NPO status , Patient's Chart, lab work & pertinent test results  Airway Mallampati: II TM Distance: >3 FB Neck ROM: Full    Dental no notable dental hx. (+) Dental Advisory Given and Teeth Intact   Pulmonary neg pulmonary ROS,  breath sounds clear to auscultation  Pulmonary exam normal       Cardiovascular hypertension, Pt. on medications + Peripheral Vascular Disease + Valvular Problems/Murmurs AI Rhythm:Regular Rate:Normal     Neuro/Psych Anxiety Depression TIACVA, No Residual Symptoms negative psych ROS   GI/Hepatic Neg liver ROS, GERD-  Medicated,  Endo/Other  negative endocrine ROSdiabetes  Renal/GU Renal InsufficiencyRenal disease  negative genitourinary   Musculoskeletal   Abdominal   Peds  Hematology negative hematology ROS (+)   Anesthesia Other Findings   Reproductive/Obstetrics negative OB ROS                          Anesthesia Physical Anesthesia Plan  ASA: III  Anesthesia Plan: General   Post-op Pain Management:    Induction: Intravenous  Airway Management Planned: LMA  Additional Equipment:   Intra-op Plan:   Post-operative Plan: Extubation in OR  Informed Consent: I have reviewed the patients History and Physical, chart, labs and discussed the procedure including the risks, benefits and alternatives for the proposed anesthesia with the patient or authorized representative who has indicated his/her understanding and acceptance.   Dental advisory given  Plan Discussed with: CRNA  Anesthesia Plan Comments:         Anesthesia Quick Evaluation

## 2012-12-19 NOTE — Progress Notes (Signed)
Call to [facility], spoke with Maralyn Sago, rec'd schedule of meds & established what he was administered this a.m.

## 2012-12-22 NOTE — Progress Notes (Signed)
Pt's family states patient is in assisted living, but is doing well

## 2012-12-23 ENCOUNTER — Encounter (HOSPITAL_COMMUNITY): Payer: Self-pay | Admitting: Surgery

## 2013-01-07 ENCOUNTER — Ambulatory Visit (INDEPENDENT_AMBULATORY_CARE_PROVIDER_SITE_OTHER): Payer: Medicare Other | Admitting: Surgery

## 2013-01-07 ENCOUNTER — Encounter (INDEPENDENT_AMBULATORY_CARE_PROVIDER_SITE_OTHER): Payer: Self-pay | Admitting: Surgery

## 2013-01-07 VITALS — BP 140/80 | HR 68 | Temp 97.8°F | Resp 14 | Ht 73.0 in | Wt 199.2 lb

## 2013-01-07 DIAGNOSIS — K4090 Unilateral inguinal hernia, without obstruction or gangrene, not specified as recurrent: Secondary | ICD-10-CM

## 2013-01-07 NOTE — Patient Instructions (Signed)
  COCOA BUTTER & VITAMIN E CREAM  ([employer] or other brand)  Apply cocoa butter/vitamin E cream to your incision 2 - 3 times daily.  Massage cream into incision for one minute with each application.  Use sunscreen (50 SPF or higher) for first 6 months after surgery if area is exposed to sun.  You may substitute Mederma or other scar reducing creams as desired.   

## 2013-01-07 NOTE — Progress Notes (Signed)
[facility]  Chief Complaint  Patient presents with  . Routine Post Op    RIH repair 12/19/2012    HISTORY: Patient is an 79 year old male who underwent right inguinal hernia repair with mesh on 12/19/2012. Postoperative course has been uneventful. Overall he has done very well.  EXAM: Incision in the right groin is well-healed. No sign of infection. Palpation in the inguinal canal with cough and Valsalva shows no sign of recurrence.  IMPRESSION: Status post right inguinal hernia repair with mesh  PLAN: Patient will begin applying topical creams to his incision. He is released to full activity with the exception of heavy lifting. He is restricted to 20 pounds lifting for the next 3 weeks.  Patient will return for surgical care as needed.  Velora Heckler, MD, FACS General & Endocrine Surgery [facility]   Visit Diagnoses: 1. Inguinal hernia unilateral, non-recurrent, right

## 2013-03-22 ENCOUNTER — Emergency Department (HOSPITAL_COMMUNITY): Payer: Medicare Other

## 2013-03-22 ENCOUNTER — Encounter (HOSPITAL_COMMUNITY)
Admission: EM | Disposition: A | Discharge status: Home / Self Care | Payer: Self-pay | Source: Home / Self Care | Attending: Emergency Medicine

## 2013-03-22 ENCOUNTER — Other Ambulatory Visit: Payer: Self-pay

## 2013-03-22 ENCOUNTER — Encounter (HOSPITAL_COMMUNITY): Payer: Medicare Other | Admitting: Anesthesiology

## 2013-03-22 ENCOUNTER — Ambulatory Visit (HOSPITAL_COMMUNITY)
Admission: EM | Admit: 2013-03-22 | Discharge: 2013-03-22 | Disposition: A | Discharge status: Home / Self Care | Payer: Medicare Other | Attending: Emergency Medicine | Admitting: Emergency Medicine

## 2013-03-22 ENCOUNTER — Encounter (HOSPITAL_COMMUNITY): Payer: Self-pay | Admitting: Emergency Medicine

## 2013-03-22 ENCOUNTER — Emergency Department (HOSPITAL_COMMUNITY): Payer: Medicare Other | Admitting: Anesthesiology

## 2013-03-22 DIAGNOSIS — Z9181 History of falling: Secondary | ICD-10-CM | POA: Insufficient documentation

## 2013-03-22 DIAGNOSIS — E119 Type 2 diabetes mellitus without complications: Secondary | ICD-10-CM | POA: Insufficient documentation

## 2013-03-22 DIAGNOSIS — C61 Malignant neoplasm of prostate: Secondary | ICD-10-CM | POA: Insufficient documentation

## 2013-03-22 DIAGNOSIS — Z794 Long term (current) use of insulin: Secondary | ICD-10-CM | POA: Insufficient documentation

## 2013-03-22 DIAGNOSIS — S61219A Laceration without foreign body of unspecified finger without damage to nail, initial encounter: Secondary | ICD-10-CM

## 2013-03-22 DIAGNOSIS — W19XXXA Unspecified fall, initial encounter: Secondary | ICD-10-CM

## 2013-03-22 DIAGNOSIS — S61209A Unspecified open wound of unspecified finger without damage to nail, initial encounter: Secondary | ICD-10-CM | POA: Insufficient documentation

## 2013-03-22 DIAGNOSIS — W1809XA Striking against other object with subsequent fall, initial encounter: Secondary | ICD-10-CM | POA: Insufficient documentation

## 2013-03-22 DIAGNOSIS — I10 Essential (primary) hypertension: Secondary | ICD-10-CM | POA: Insufficient documentation

## 2013-03-22 DIAGNOSIS — F0390 Unspecified dementia without behavioral disturbance: Secondary | ICD-10-CM | POA: Insufficient documentation

## 2013-03-22 DIAGNOSIS — R413 Other amnesia: Secondary | ICD-10-CM | POA: Insufficient documentation

## 2013-03-22 DIAGNOSIS — Z7902 Long term (current) use of antithrombotics/antiplatelets: Secondary | ICD-10-CM | POA: Insufficient documentation

## 2013-03-22 DIAGNOSIS — Y9210 Unspecified residential institution as the place of occurrence of the external cause: Secondary | ICD-10-CM | POA: Insufficient documentation

## 2013-03-22 DIAGNOSIS — G319 Degenerative disease of nervous system, unspecified: Secondary | ICD-10-CM | POA: Insufficient documentation

## 2013-03-22 HISTORY — PX: LACERATION REPAIR: SHX5284

## 2013-03-22 LAB — COMPREHENSIVE METABOLIC PANEL
ALT: 10 U/L (ref 0–53)
AST: 14 U/L (ref 0–37)
Albumin: 3.8 g/dL (ref 3.5–5.2)
CO2: 25 mEq/L (ref 19–32)
Calcium: 9.1 mg/dL (ref 8.4–10.5)
Creatinine, Ser: 1.46 mg/dL — ABNORMAL HIGH (ref 0.50–1.35)
GFR calc non Af Amer: 41 mL/min — ABNORMAL LOW (ref >90)
Potassium: 3.7 mEq/L (ref 3.5–5.1)
Sodium: 136 mEq/L (ref 135–145)
Total Bilirubin: 0.5 mg/dL (ref 0.3–1.2)
Total Protein: 7.1 g/dL (ref 6.0–8.3)

## 2013-03-22 LAB — CBC WITH DIFFERENTIAL/PLATELET
Basophils Absolute: 0 10*3/uL (ref 0.0–0.1)
Basophils Relative: 0 % (ref 0–1)
Eosinophils Absolute: 0.1 10*3/uL (ref 0.0–0.7)
Eosinophils Relative: 2 % (ref 0–5)
HCT: 38.4 % — ABNORMAL LOW (ref 39.0–52.0)
MCH: 30.5 pg (ref 26.0–34.0)
MCHC: 34.4 g/dL (ref 30.0–36.0)
MCV: 88.7 fL (ref 78.0–100.0)
Monocytes Absolute: 0.5 10*3/uL (ref 0.1–1.0)
Neutrophils Relative %: 80 % — ABNORMAL HIGH (ref 43–77)
RDW: 14 % (ref 11.5–15.5)

## 2013-03-22 LAB — GLUCOSE, CAPILLARY: Glucose-Capillary: 205 mg/dL — ABNORMAL HIGH (ref 70–99)

## 2013-03-22 LAB — URINALYSIS W MICROSCOPIC + REFLEX CULTURE
Glucose, UA: 250 mg/dL — AB
Hgb urine dipstick: NEGATIVE
Ketones, ur: NEGATIVE mg/dL
Leukocytes, UA: NEGATIVE
Nitrite: NEGATIVE
Protein, ur: 100 mg/dL — AB
Specific Gravity, Urine: 1.014 (ref 1.005–1.030)

## 2013-03-22 SURGERY — REPAIR, LACERATION, 2 OR MORE
Anesthesia: Monitor Anesthesia Care | Site: Hand | Laterality: Right

## 2013-03-22 MED ORDER — BUPIVACAINE HCL (PF) 0.25 % IJ SOLN
INTRAMUSCULAR | Status: AC
  Filled 2013-03-22: qty 10

## 2013-03-22 MED ORDER — LIDOCAINE HCL 1 % IJ SOLN
INTRAMUSCULAR | Status: DC | PRN
  Administered 2013-03-22: 4 mL

## 2013-03-22 MED ORDER — FENTANYL CITRATE 0.05 MG/ML IJ SOLN
INTRAMUSCULAR | Status: DC | PRN
  Administered 2013-03-22: 25 ug via INTRAVENOUS

## 2013-03-22 MED ORDER — BUPIVACAINE HCL (PF) 0.25 % IJ SOLN
INTRAMUSCULAR | Status: DC | PRN
  Administered 2013-03-22: 4 mL

## 2013-03-22 MED ORDER — PROPOFOL 10 MG/ML IV BOLUS
INTRAVENOUS | Status: DC | PRN
  Administered 2013-03-22: 30 mg via INTRAVENOUS
  Administered 2013-03-22: 120 mg via INTRAVENOUS

## 2013-03-22 MED ORDER — CEFAZOLIN SODIUM-DEXTROSE 2-3 GM-% IV SOLR
INTRAVENOUS | Status: AC
  Administered 2013-03-22: 2 g via INTRAVENOUS
  Filled 2013-03-22: qty 50

## 2013-03-22 MED ORDER — LACTATED RINGERS IV SOLN
INTRAVENOUS | Status: DC | PRN
  Administered 2013-03-22: 20:00:00 via INTRAVENOUS

## 2013-03-22 MED ORDER — HYDROCODONE-ACETAMINOPHEN 5-325 MG PO TABS: 1.0000 | ORAL_TABLET | Freq: Four times a day (QID) | ORAL | Status: DC | PRN

## 2013-03-22 MED ORDER — TETANUS-DIPHTH-ACELL PERTUSSIS 5-2.5-18.5 LF-MCG/0.5 IM SUSP
0.5000 mL | Freq: Once | INTRAMUSCULAR | Status: AC
  Administered 2013-03-22: 0.5 mL via INTRAMUSCULAR
  Filled 2013-03-22: qty 0.5

## 2013-03-22 MED ORDER — ONDANSETRON HCL 4 MG/2ML IJ SOLN
INTRAMUSCULAR | Status: DC | PRN
  Administered 2013-03-22: 4 mg via INTRAVENOUS

## 2013-03-22 MED ORDER — FENTANYL CITRATE 0.05 MG/ML IJ SOLN: 25.0000 ug | INTRAMUSCULAR | Status: DC | PRN

## 2013-03-22 MED ORDER — ONDANSETRON HCL 4 MG/2ML IJ SOLN: 4.0000 mg | Freq: Once | INTRAMUSCULAR | Status: DC | PRN

## 2013-03-22 MED ORDER — SODIUM CHLORIDE 0.9 % IV BOLUS (SEPSIS)
500.0000 mL | INTRAVENOUS | Status: AC
  Administered 2013-03-22: 500 mL via INTRAVENOUS

## 2013-03-22 MED ORDER — LIDOCAINE HCL (PF) 1 % IJ SOLN
INTRAMUSCULAR | Status: AC
  Filled 2013-03-22: qty 30

## 2013-03-22 SURGICAL SUPPLY — 12 items
BANDAGE CONFORM 2  STR LF (GAUZE/BANDAGES/DRESSINGS) ×2 IMPLANT
BNDG COHESIVE 1X5 TAN STRL LF (GAUZE/BANDAGES/DRESSINGS) ×2 IMPLANT
GAUZE XEROFORM 1X8 LF (GAUZE/BANDAGES/DRESSINGS) ×2 IMPLANT
GOWN BRE IMP SLV AUR LG STRL (GOWN DISPOSABLE) ×4 IMPLANT
KIT BASIN OR (CUSTOM PROCEDURE TRAY) ×2 IMPLANT
KIT ROOM TURNOVER OR (KITS) ×2 IMPLANT
NEEDLE HYPO 25GX1X1/2 BEV (NEEDLE) ×2 IMPLANT
PACK ORTHO EXTREMITY (CUSTOM PROCEDURE TRAY) ×2 IMPLANT
SPLINT FINGER (SOFTGOODS) ×2 IMPLANT
SPONGE GAUZE 4X4 12PLY (GAUZE/BANDAGES/DRESSINGS) ×2 IMPLANT
SUT MON AB 5-0 P3 18 (SUTURE) ×2 IMPLANT
SYR CONTROL 10ML LL (SYRINGE) ×2 IMPLANT

## 2013-03-22 NOTE — Op Note (Signed)
[id number] 

## 2013-03-22 NOTE — H&P (Signed)
Ronald Hunt is an 79 y.o. male.   Chief Complaint: right hand laceration  HPI: 79 yo male present with son and daughter in law.  They state he fell early this afternoon and caught right hand in air conditioner grate causing laceration to right small finger and superficial laceration to right long finger.  Brought to [facility].  Wound irrigated and closed by ED staff, but felt to not be adequate.  I was consulted to assist with injury management.    Past Medical History  Diagnosis Date  . Dizziness   . Macular degeneration   . History of TIAs   . Carotid artery occlusion   . Glaucoma   . Stroke     TIA  . Mitral valvular disorder   . Hypertension     takes Amlodipine and Benicar daily  . Heart murmur   . Hoarseness     states pretty much all the time  . Diabetes mellitus     takes Glimepiride daily  . GERD (gastroesophageal reflux disease)     doesn't require meds  . Constipation     occasionally Mag Citrate  . Urinary frequency   . Cancer     prostate;takes Flomax daily  . Nocturia   . Depression     but doesn't require meds  . Anxiety     doesn't take any meds  . Short-term memory loss   . Long-term memory loss   . Aortic insufficiency     mild-moderate AR by echo 07/2011 (Dr. Rennis Golden)  . Urinary tract infection, site not specified   . Orthostatic hypotension   . Dementia   . Other symptoms involving cardiovascular system   . Unspecified transient cerebral ischemia   . Unspecified vitamin D deficiency   . Malignant neoplasm of prostate   . Unspecified glaucoma(365.9)   . Senile cataract, unspecified   . Unspecified essential hypertension   . Other abnormal blood chemistry   . Proteinuria   . Complication of anesthesia     bowels, bladder slow to "awaken" after surgery  . Acute kidney failure, unspecified     3/14 no problems now    Past Surgical History  Procedure Laterality Date  . Retinal detachment surgery Bilateral 70's 80's  . Cataract extraction  Bilateral   . Endarterectomy  09/12/2011    Procedure: ENDARTERECTOMY CAROTID;  Surgeon: Sherren Kerns, MD;  Location: [facility] OR;  Service: Vascular;  Laterality: Right;  right carotid artery endarterectomy  with dacron patch angioplasty  . Carotid endarterectomy  09/12/11    RIGHT  cea  . Inguinal hernia repair Right 12/19/2012    Procedure: REPAIR RIGHT INGUINAL HERNIA ;  Surgeon: Velora Heckler, MD;  Location: [facility] OR;  Service: General;  Laterality: Right;  . Insertion of mesh N/A 12/19/2012    Procedure: INSERTION OF MESH;  Surgeon: Velora Heckler, MD;  Location: [facility] OR;  Service: General;  Laterality: N/A;    Family History  Problem Relation Age of Onset  . Cancer Brother   . Crohn's disease Son    Social History:  reports that he has never smoked. He has never used smokeless tobacco. He reports that he does not drink alcohol or use illicit drugs.  Allergies: No Known Allergies   (Not in a hospital admission)  Results for orders placed during the hospital encounter of 03/22/13 (from the past 48 hour(s))  URINALYSIS W MICROSCOPIC + REFLEX CULTURE     Status: Abnormal   Collection Time  03/22/13  3:45 PM      Result Value Range   Color, Urine YELLOW  YELLOW   APPearance CLEAR  CLEAR   Specific Gravity, Urine 1.014  1.005 - 1.030   pH 5.5  5.0 - 8.0   Glucose, UA 250 (*) NEGATIVE mg/dL   Hgb urine dipstick NEGATIVE  NEGATIVE   Bilirubin Urine NEGATIVE  NEGATIVE   Ketones, ur NEGATIVE  NEGATIVE mg/dL   Protein, ur 161 (*) NEGATIVE mg/dL   Urobilinogen, UA 0.2  0.0 - 1.0 mg/dL   Nitrite NEGATIVE  NEGATIVE   Leukocytes, UA NEGATIVE  NEGATIVE   WBC, UA 0-2  <3 WBC/hpf   Squamous Epithelial / LPF FEW (*) RARE  CBC WITH DIFFERENTIAL     Status: Abnormal   Collection Time    03/22/13  4:05 PM      Result Value Range   WBC 7.4  4.0 - 10.5 K/uL   RBC 4.33  4.22 - 5.81 MIL/uL   Hemoglobin 13.2  13.0 - 17.0 g/dL   HCT 09.6 (*) 04.5 - 40.9 %   MCV 88.7  78.0 - 100.0 fL   MCH 30.5   26.0 - 34.0 pg   MCHC 34.4  30.0 - 36.0 g/dL   RDW 81.1  91.4 - 78.2 %   Platelets 140 (*) 150 - 400 K/uL   Neutrophils Relative % 80 (*) 43 - 77 %   Neutro Abs 5.9  1.7 - 7.7 K/uL   Lymphocytes Relative 11 (*) 12 - 46 %   Lymphs Abs 0.8  0.7 - 4.0 K/uL   Monocytes Relative 7  3 - 12 %   Monocytes Absolute 0.5  0.1 - 1.0 K/uL   Eosinophils Relative 2  0 - 5 %   Eosinophils Absolute 0.1  0.0 - 0.7 K/uL   Basophils Relative 0  0 - 1 %   Basophils Absolute 0.0  0.0 - 0.1 K/uL  COMPREHENSIVE METABOLIC PANEL     Status: Abnormal   Collection Time    03/22/13  4:05 PM      Result Value Range   Sodium 136  135 - 145 mEq/L   Potassium 3.7  3.5 - 5.1 mEq/L   Chloride 101  96 - 112 mEq/L   CO2 25  19 - 32 mEq/L   Glucose, Bld 296 (*) 70 - 99 mg/dL   BUN 33 (*) 6 - 23 mg/dL   Creatinine, Ser 9.56 (*) 0.50 - 1.35 mg/dL   Calcium 9.1  8.4 - 21.3 mg/dL   Total Protein 7.1  6.0 - 8.3 g/dL   Albumin 3.8  3.5 - 5.2 g/dL   AST 14  0 - 37 U/L   ALT 10  0 - 53 U/L   Alkaline Phosphatase 86  39 - 117 U/L   Total Bilirubin 0.5  0.3 - 1.2 mg/dL   GFR calc non Af Amer 41 (*) >90 mL/min   GFR calc Af Amer 48 (*) >90 mL/min   Comment: (NOTE)     The eGFR has been calculated using the CKD EPI equation.     This calculation has not been validated in all clinical situations.     eGFR's persistently <90 mL/min signify possible Chronic Kidney     Disease.  TROPONIN I     Status: None   Collection Time    03/22/13  4:05 PM      Result Value Range   Troponin I <0.30  <0.30 ng/mL  Comment:            Due to the release kinetics of cTnI,     a negative result within the first hours     of the onset of symptoms does not rule out     myocardial infarction with certainty.     If myocardial infarction is still suspected,     repeat the test at appropriate intervals.    Dg Chest 2 View  03/22/2013   CLINICAL DATA:  Larey Seat yesterday.  EXAM: CHEST  2 VIEW  COMPARISON:  06/20/2012  FINDINGS: Cardiac  silhouette is normal in size. The aorta is mildly uncoiled. No mediastinal or hilar masses.  Clear lungs.  No pleural effusion or pneumothorax.  The bony thorax is demineralized but intact.  IMPRESSION: No active cardiopulmonary disease.   Electronically Signed   By: Amie Portland M.D.   On: 03/22/2013 16:37   Ct Head Wo Contrast  03/22/2013   CLINICAL DATA:  Fall  EXAM: CT HEAD WITHOUT CONTRAST  TECHNIQUE: Contiguous axial images were obtained from the base of the skull through the vertex without intravenous contrast.  COMPARISON:  06/20/2012  FINDINGS: Generalized atrophy which has progressed in the interval.  Negative for intracranial hemorrhage. Chronic microvascular ischemic changes in the white matter. No acute infarct or mass. Negative for skull fracture.  IMPRESSION: No acute abnormality.  Progression of atrophy since 06/20/2012.   Electronically Signed   By: Marlan Palau M.D.   On: 03/22/2013 17:08   Dg Hand Complete Right  03/22/2013   CLINICAL DATA:  Fall.  Laceration  EXAM: RIGHT HAND - COMPLETE 3+ VIEW  COMPARISON:  None.  FINDINGS: Suboptimal study. The patient cannot straighten his fingers and there is bandage overlying the fingers due to soft tissue injury.  No fracture is identified. Further imaging may be necessary to rule out a fracture.  IMPRESSION: Suboptimal study due to bandages and difficulty with positioning. No fracture identified.   Electronically Signed   By: Marlan Palau M.D.   On: 03/22/2013 16:38     A comprehensive review of systems was negative except for: Hematologic/lymphatic: positive for bleeding and easy bruising  Blood pressure 166/80, pulse 71, temperature 97.6 F (36.4 C), temperature source Oral, resp. rate 13, SpO2 99.00%.  General appearance: alert, cooperative and appears stated age Head: Normocephalic, without obvious abnormality, atraumatic Neck: supple, symmetrical, trachea midline Resp: clear to auscultation bilaterally Cardio: regular rate  and rhythm GI: non tender Extremities: intact sensation and capillary refill all digits except right index where digital block has been performed.  intact capillary refill at tip of finger.  intact fdp/fds.  distally based flap of right small finger distal phalanx.  brisk bleeding from wound.  Superficial laceration long finger. Pulses: 2+ and symmetric Skin: Skin color, texture, turgor normal. No rashes or lesions Neurologic: Grossly normal Incision/Wound: As above  Assessment/Plan Right index and long finger lacerations.  Arterial bleeding of right small finger difficult to control with direct pressure.  Likely due to oliquity of wound.  Recommend OR for I&D, hemostasis, and wound closure.  Discussed potential issues with distally based tissue flap and tissue necrosis.  Risks, benefits, and alternatives of surgery were discussed and the patient and his family members agree with the plan of care.   Wei Poplaski R 03/22/2013, 7:27 PM

## 2013-03-22 NOTE — ED Provider Notes (Signed)
CSN: [id number]     Arrival date & time 03/22/13  1501 History   First MD Initiated Contact with Patient 03/22/13 1523     Chief Complaint  Patient presents with  . Fall  . Laceration   (Consider location/radiation/quality/duration/timing/severity/associated sxs/prior Treatment) Patient is a 79 y.o. male presenting with fall and skin laceration. The history is provided by the patient and the EMS personnel (family).  Fall This is a new problem. The current episode started 1 to 2 hours ago. Episode frequency: 2-3 times in the last day. The problem has not changed since onset.Pertinent negatives include no chest pain, no abdominal pain, no headaches and no shortness of breath. Nothing aggravates the symptoms. Nothing relieves the symptoms. He has tried nothing for the symptoms. The treatment provided no relief.  Laceration   Past Medical History  Diagnosis Date  . Dizziness   . Macular degeneration   . History of TIAs   . Carotid artery occlusion   . Glaucoma   . Stroke     TIA  . Mitral valvular disorder   . Hypertension     takes Amlodipine and Benicar daily  . Heart murmur   . Hoarseness     states pretty much all the time  . Diabetes mellitus     takes Glimepiride daily  . GERD (gastroesophageal reflux disease)     doesn't require meds  . Constipation     occasionally Mag Citrate  . Urinary frequency   . Cancer     prostate;takes Flomax daily  . Nocturia   . Depression     but doesn't require meds  . Anxiety     doesn't take any meds  . Short-term memory loss   . Long-term memory loss   . Aortic insufficiency     mild-moderate AR by echo 07/2011 (Dr. Rennis Golden)  . Urinary tract infection, site not specified   . Orthostatic hypotension   . Dementia   . Other symptoms involving cardiovascular system   . Unspecified transient cerebral ischemia   . Unspecified vitamin D deficiency   . Malignant neoplasm of prostate   . Unspecified glaucoma(365.9)   . Senile cataract,  unspecified   . Unspecified essential hypertension   . Other abnormal blood chemistry   . Proteinuria   . Complication of anesthesia     bowels, bladder slow to "awaken" after surgery  . Acute kidney failure, unspecified     3/14 no problems now   Past Surgical History  Procedure Laterality Date  . Retinal detachment surgery Bilateral 70's 80's  . Cataract extraction Bilateral   . Endarterectomy  09/12/2011    Procedure: ENDARTERECTOMY CAROTID;  Surgeon: Sherren Kerns, MD;  Location: [facility] OR;  Service: Vascular;  Laterality: Right;  right carotid artery endarterectomy  with dacron patch angioplasty  . Carotid endarterectomy  09/12/11    RIGHT  cea  . Inguinal hernia repair Right 12/19/2012    Procedure: REPAIR RIGHT INGUINAL HERNIA ;  Surgeon: Velora Heckler, MD;  Location: [facility] OR;  Service: General;  Laterality: Right;  . Insertion of mesh N/A 12/19/2012    Procedure: INSERTION OF MESH;  Surgeon: Velora Heckler, MD;  Location: [facility] OR;  Service: General;  Laterality: N/A;   Family History  Problem Relation Age of Onset  . Cancer Brother   . Crohn's disease Son    History  Substance Use Topics  . Smoking status: Never Smoker   . Smokeless tobacco: Never Used  .  Alcohol Use: No     Comment: occasionally beer 2-3 times/month    Review of Systems  Constitutional: Negative for fever.  HENT: Negative for drooling and rhinorrhea.   Eyes: Negative for pain.  Respiratory: Negative for cough and shortness of breath.   Cardiovascular: Negative for chest pain and leg swelling.  Gastrointestinal: Negative for nausea, vomiting, abdominal pain and diarrhea.  Genitourinary: Negative for dysuria and hematuria.  Musculoskeletal: Negative for gait problem and neck pain.  Skin: Negative for color change.  Neurological: Negative for numbness and headaches.  Hematological: Negative for adenopathy.  Psychiatric/Behavioral: Negative for behavioral problems.  All other systems reviewed and are  negative.    Allergies  Review of patient's allergies indicates no known allergies.  Home Medications   Current Outpatient Rx  Name  Route  Sig  Dispense  Refill  . acetaminophen (TYLENOL) 325 MG tablet   Oral   Take 650 mg by mouth every 6 (six) hours as needed for pain or fever.         Marland Kitchen amLODipine (NORVASC) 10 MG tablet   Oral   Take 1 tablet (10 mg total) by mouth daily.   30 tablet   6   . bimatoprost (LUMIGAN) 0.01 % SOLN   Both Eyes   Place 1 drop into both eyes at bedtime.         . calcium carbonate (TUMS) 500 MG chewable tablet   Oral   Chew 1 tablet by mouth 2 (two) times daily as needed for heartburn (upset stomach).         . clopidogrel (PLAVIX) 75 MG tablet   Oral   Take 1 tablet (75 mg total) by mouth daily with breakfast.   30 tablet   0   . docusate sodium (COLACE) 100 MG capsule   Oral   Take 100 mg by mouth daily.         . insulin aspart (NOVOLOG) 100 UNIT/ML injection   Subcutaneous   Inject 0-9 Units into the skin 4 (four) times daily -  before meals and at bedtime. Sliding scale:  CBG 70-120 0 units; 121-150 1 unit, 151-200 2 units, 201-250 3 units, 251-300 6 units, 301-350 7 units, 351-400 8 units, >400 9 units & notify MD         . insulin glargine (LANTUS) 100 UNIT/ML injection   Subcutaneous   Inject 12 Units into the skin at bedtime.         Marland Kitchen LORazepam (ATIVAN) 0.5 MG tablet   Oral   Take 0.5 mg by mouth every 6 (six) hours as needed (agitation).          Marland Kitchen losartan (COZAAR) 50 MG tablet   Oral   Take 1 tablet (50 mg total) by mouth daily.   30 tablet   3   . nystatin cream (MYCOSTATIN)   Topical   Apply 1 application topically 2 (two) times daily.         . Tamsulosin HCl (FLOMAX) 0.4 MG CAPS   Oral   Take 0.4 mg by mouth daily. 1/2 hour following the same meal each day         . timolol (TIMOPTIC) 0.5 % ophthalmic solution   Both Eyes   Place 1 drop into both eyes 2 (two) times daily.   10 mL   3     BP 160/76  Pulse 72  Temp(Src) 97.6 F (36.4 C) (Oral)  Resp 19  SpO2 98% Physical Exam  Nursing note and vitals reviewed. Constitutional: He is oriented to person, place, and time. He appears well-developed and well-nourished.  HENT:  Head: Normocephalic and atraumatic.  Right Ear: External ear normal.  Left Ear: External ear normal.  Nose: Nose normal.  Mouth/Throat: Oropharynx is clear and moist. No oropharyngeal exudate.  Eyes: Conjunctivae and EOM are normal. Pupils are equal, round, and reactive to light.  Neck: Normal range of motion. Neck supple.  Cardiovascular: Normal rate, regular rhythm, normal heart sounds and intact distal pulses.  Exam reveals no gallop and no friction rub.   No murmur heard. Pulmonary/Chest: Effort normal and breath sounds normal. No respiratory distress. He has no wheezes.  Abdominal: Soft. Bowel sounds are normal. He exhibits no distension. There is no tenderness. There is no rebound and no guarding.  Musculoskeletal: Normal range of motion. He exhibits no edema and no tenderness.  Several small well approximated superficial lacerations to the volar aspect of the middle finger on the right hand.  3 cm deep laceration to the distal finger pad of the little digit of the right hand. Moderate swelling with exposure of finger pulp. The patient has 2+ distal pulses in the right upper extremity. Sensation appears to be intact in the right hand. The patient appears to have normal motor skills in the right hand and the digits of the right hand.   Neurological: He is alert and oriented to person, place, and time. He has normal strength. A sensory deficit is present. Coordination and gait normal.  Skin: Skin is warm and dry.  Psychiatric: He has a normal mood and affect. His behavior is normal.    ED Course  Procedures (including critical care time) Labs Review Labs Reviewed  CBC WITH DIFFERENTIAL  COMPREHENSIVE METABOLIC PANEL  TROPONIN I  URINALYSIS  W MICROSCOPIC + REFLEX CULTURE   Imaging Review No results found.  EKG Interpretation   None       Right hand  Right hand, 5th digit.   MDM   1. Finger laceration, initial encounter   2. Fall, initial encounter    3:43 PM 79 y.o. male with a history of orthostatic hypotension presents with a suspected mechanical fall which occurred prior to arrival. His facility informed EMS that the patient has fallen 2 times in the last 24 hours. Today he fell onto an air conditioner and got his right small finger caught in the event. He suffered a laceration when he jerked his hand out from the air conditioner. He is unsure if he hit his head but denies any headache now. He does not believe he lost consciousness. He has a history of dementia but is alert and oriented x3 here and seems to be at baseline per family but mildly drowsy. Will get screening labs, imaging.  Updated tdap. I interpreted/reviewed the labs and/or imaging which were non-contributory.  Mild renal insuff not a new finding. Pt is ambulatory. I irrigated and attempted to repair the right little finger laceration, but was having issues w/ bleeding and cosmesis. I consulted Dr. Merlyn Lot who will take the pt to the OR for repair. As labs/imaging otherwise non-contrib and pt appears well. Will rec d/c back to facility after repair.   Junius Argyle, MD 03/23/13 1300

## 2013-03-22 NOTE — ED Notes (Signed)
Fingers on right hand cleaned and dressing applied.

## 2013-03-22 NOTE — Anesthesia Preprocedure Evaluation (Addendum)
Anesthesia Evaluation  Patient identified by MRN, date of birth, ID band Patient awake    Reviewed: Allergy & Precautions, H&P , NPO status , Patient's Chart, lab work & pertinent test results  History of Anesthesia Complications Negative for: history of anesthetic complications  Airway Mallampati: I TM Distance: >3 FB Neck ROM: Full    Dental  (+) Teeth Intact and Dental Advisory Given   Pulmonary  breath sounds clear to auscultation        Cardiovascular hypertension, + Valvular Problems/Murmurs AI and MR Rhythm:Regular Rate:Normal     Neuro/Psych TIA   GI/Hepatic   Endo/Other  diabetes, Poorly Controlled, Type 2, Insulin Dependent  Renal/GU      Musculoskeletal   Abdominal   Peds  Hematology   Anesthesia Other Findings   Reproductive/Obstetrics                          Anesthesia Physical Anesthesia Plan  ASA: III and emergent  Anesthesia Plan: MAC   Post-op Pain Management:    Induction: Intravenous  Airway Management Planned: Simple Face Mask  Additional Equipment:   Intra-op Plan:   Post-operative Plan:   Informed Consent: I have reviewed the patients History and Physical, chart, labs and discussed the procedure including the risks, benefits and alternatives for the proposed anesthesia with the patient or authorized representative who has indicated his/her understanding and acceptance.   Dental advisory given  Plan Discussed with: CRNA, Anesthesiologist and Surgeon  Anesthesia Plan Comments:         Anesthesia Quick Evaluation

## 2013-03-22 NOTE — Transfer of Care (Signed)
Immediate Anesthesia Transfer of Care Note  Patient: Ronald Hunt  Procedure(s) Performed: Procedure(s): REPAIR MULTIPLE LACERATIONS (Right)  Patient Location: PACU  Anesthesia Type:General  Level of Consciousness: responds to stimulation  Airway & Oxygen Therapy: Patient Spontanous Breathing and Patient connected to face mask oxygen  Post-op Assessment: Report given to PACU RN and Post -op Vital signs reviewed and stable  Post vital signs: Reviewed and stable  Complications: No apparent anesthesia complications

## 2013-03-22 NOTE — Anesthesia Procedure Notes (Addendum)
Procedure Name: MAC Date/Time: 03/22/2013 9:20 PM Performed by: Arlice Colt B Pre-anesthesia Checklist: Patient identified, Emergency Drugs available, Suction available, Patient being monitored and Timeout performed Patient Re-evaluated:Patient Re-evaluated prior to inductionOxygen Delivery Method: Simple face mask   Procedure Name: LMA Insertion Date/Time: 03/22/2013 9:30 PM Performed by: Arlice Colt B Oxygen Delivery Method: Circle system utilized Preoxygenation: Pre-oxygenation with 100% oxygen Intubation Type: IV induction LMA: LMA inserted LMA Size: 4.0 Number of attempts: 1 Placement Confirmation: positive ETCO2 and breath sounds checked- equal and bilateral Tube secured with: Tape Dental Injury: Teeth and Oropharynx as per pre-operative assessment

## 2013-03-22 NOTE — ED Notes (Signed)
Per report form Gust Brooms, RN patient is ready to be transported to OR. Transporting patient at this time.

## 2013-03-22 NOTE — Brief Op Note (Signed)
03/22/2013  10:11 PM  PATIENT:  Ronald Hunt  79 y.o. male  PRE-OPERATIVE DIAGNOSIS:  laceration to hand  POST-OPERATIVE DIAGNOSIS:  laceration to long and small fingers  PROCEDURE:  Procedure(s): REPAIR MULTIPLE LACERATIONS (Right)  SURGEON:  Surgeon(s) and Role:    * Tami Ribas, MD - Primary  PHYSICIAN ASSISTANT:   ASSISTANTS: none   ANESTHESIA:   general  EBL:     BLOOD ADMINISTERED:none  DRAINS: none   LOCAL MEDICATIONS USED:  BUPIVICAINE  and LIDOCAINE   SPECIMEN:  No Specimen  DISPOSITION OF SPECIMEN:  N/A  COUNTS:  YES  TOURNIQUET:  * No tourniquets in log *  DICTATION: .Other Dictation: Dictation Number [id number]  PLAN OF CARE: Discharge to home after PACU  PATIENT DISPOSITION:  PACU - hemodynamically stable.

## 2013-03-22 NOTE — ED Notes (Signed)
Dr Rica Mast in to see patient.

## 2013-03-22 NOTE — ED Notes (Signed)
Patient is a resident at [facility] assisted living.  Staff reports he fell 3 times in the last 24 hour. This last fall staff reported that he caught his right pinky and middle finger in a air return grate.  Bleeding controlled

## 2013-03-22 NOTE — Anesthesia Postprocedure Evaluation (Signed)
  Anesthesia Post-op Note  Patient: Ronald Hunt  Procedure(s) Performed: Procedure(s): REPAIR MULTIPLE LACERATIONS (Right)  Patient Location: PACU  Anesthesia Type:General  Level of Consciousness: alert   Airway and Oxygen Therapy: Patient Spontanous Breathing and Patient connected to face mask oxygen  Post-op Pain: none  Post-op Assessment: Post-op Vital signs reviewed  Post-op Vital Signs: Reviewed  Complications: No apparent anesthesia complications

## 2013-03-23 DIAGNOSIS — W19XXXA Unspecified fall, initial encounter: Secondary | ICD-10-CM

## 2013-03-23 DIAGNOSIS — S61219A Laceration without foreign body of unspecified finger without damage to nail, initial encounter: Secondary | ICD-10-CM

## 2013-03-23 NOTE — Op Note (Signed)
NAME:  Ronald Hunt, Ronald Hunt NO.:  [id number]  MEDICAL RECORD NO.:  [id number]  LOCATION:  [facility]                         FACILITY:  [facility]  PHYSICIAN:  Betha Loa, MD        DATE OF BIRTH:  1923/06/27  DATE OF PROCEDURE:  03/22/2013 DATE OF DISCHARGE:  03/22/2013                              OPERATIVE REPORT   PREOPERATIVE DIAGNOSIS:  Right long and small finger lacerations.  POSTOPERATIVE DIAGNOSIS:  Right long and small finger lacerations.  PROCEDURE:   1. Irrigation, debridement, and repair of left small finger complex laceration, 3  cm 2. Irrigation, debridement, and repair of left long finger laceration, 1.5 cm  SURGEON:  Betha Loa, MD  ASSISTANTS:  None.  ANESTHESIA:  General.  IV FLUIDS:  Per anesthesia flow sheet.  ESTIMATED BLOOD LOSS:  Minimal.  COMPLICATIONS:  None.  SPECIMENS:  None.  TOURNIQUET TIME:  None.  DISPOSITION:  Stable to PACU.  INDICATIONS:  Mr. Waibel is an 79 year old male who presented to the emergency department with his son and daughter-in-law this evening. Apparently, he had fallen in the afternoon lacerating his right hand on an air conditioner vent.  I was consulted for management of the injury. Attempts to stop bleeding in the emergency department went successful. He had some arterial bleeding and is on Plavix.  I recommended going to the operating room for irrigation and debridement of the wounds and repair of the lacerations.  Risks, benefits, and alternatives of surgery were discussed including risk of blood loss, infection, damage to nerves, vessels, tendons, ligaments, bone, failure of surgery, need for additional surgery, complications with wound healing, continued pain. They voiced understanding of these risks and elected to proceed.  OPERATIVE COURSE:  After being identified preoperatively by myself, the patient, the patient's family, and I agreed upon procedure and site of procedure.  Surgical site was  marked.  Risks, benefits, and alternatives of surgery were reviewed and they wished to proceed.  Surgical consent had been signed.  He was taken to the operating room and placed on the operating room table in supine position with the right upper extremity on arm board.  General anesthesia was induced by Anesthesiology.  The right upper extremity was prepped and draped in normal sterile orthopedic fashion.  A surgical pause was performed between surgeons, anesthesia, operating room staff, and all were in agreement as to the patient, procedure, and site of procedure.  The wounds were explored. There was arterial bleeding in the small finger wound.  This was treated with bipolar electrocautery.  There was brisk capillary refill in the fingertips after electrocautery.  The wound of the small finger was down to the bone.  The wound of the long finger was into the subcutaneous tissues.  The wounds were copiously irrigated with sterile saline.  They were repaired with 5-0 Monocryl suture in an interrupted fashion.  This apposed the soft tissues well.  The wounds were dressed with sterile Xeroform, 4x4s, and wrapped with Kling and Coban dressing lightly.  An AlumaFoam splint was placed on the small finger and wrapped with a Coban dressing lightly.  Digital block was performed to the small finger using 8  mL of 0.25% plain Marcaine and 1% plain lidocaine in a 50:50 solution.  The fingertips were all pink with brisk capillary refill after the procedure.  The operative drapes were broken down, and the patient was awoken from anesthesia safely.  He was transferred back to stretcher and taken to PACU in stable condition.  I will see him back in the office in 1 week for postoperative followup.  I will give him Norco 5/325 one p.o. q.6 hours p.r.n. pain, dispensed #20.     Betha Loa, MD     KK/MEDQ  D:  03/22/2013  T:  03/23/2013  Job:  [id number]

## 2013-03-24 ENCOUNTER — Encounter (HOSPITAL_COMMUNITY): Payer: Self-pay | Admitting: Orthopedic Surgery

## 2013-05-18 ENCOUNTER — Other Ambulatory Visit: Payer: Self-pay | Admitting: Family

## 2013-05-18 DIAGNOSIS — Z48812 Encounter for surgical aftercare following surgery on the circulatory system: Secondary | ICD-10-CM

## 2013-05-18 DIAGNOSIS — I6529 Occlusion and stenosis of unspecified carotid artery: Secondary | ICD-10-CM

## 2013-12-04 ENCOUNTER — Other Ambulatory Visit (HOSPITAL_COMMUNITY): Payer: Medicare Other

## 2013-12-04 ENCOUNTER — Ambulatory Visit: Payer: Medicare Other | Admitting: Family

## 2013-12-23 ENCOUNTER — Encounter: Payer: Self-pay | Admitting: Family

## 2013-12-24 ENCOUNTER — Ambulatory Visit (INDEPENDENT_AMBULATORY_CARE_PROVIDER_SITE_OTHER): Payer: Medicare Other | Admitting: Family

## 2013-12-24 ENCOUNTER — Encounter: Payer: Self-pay | Admitting: Family

## 2013-12-24 ENCOUNTER — Ambulatory Visit (HOSPITAL_COMMUNITY)
Admission: RE | Admit: 2013-12-24 | Discharge: 2013-12-24 | Disposition: A | Discharge status: Home / Self Care | Payer: Medicare Other | Source: Ambulatory Visit | Attending: Family | Admitting: Family

## 2013-12-24 VITALS — BP 158/79 | HR 55 | Resp 14 | Ht 73.5 in | Wt 200.0 lb

## 2013-12-24 DIAGNOSIS — I6522 Occlusion and stenosis of left carotid artery: Secondary | ICD-10-CM | POA: Diagnosis not present

## 2013-12-24 DIAGNOSIS — I6529 Occlusion and stenosis of unspecified carotid artery: Secondary | ICD-10-CM

## 2013-12-24 DIAGNOSIS — I6521 Occlusion and stenosis of right carotid artery: Secondary | ICD-10-CM

## 2013-12-24 DIAGNOSIS — Z48812 Encounter for surgical aftercare following surgery on the circulatory system: Secondary | ICD-10-CM | POA: Diagnosis not present

## 2013-12-24 NOTE — Progress Notes (Signed)
Established Carotid Patient   History of Present Illness  Ronald Hunt is a 80 y.o. male patient of Dr. Oneida Alar who status post right CEA in June 2013.  He returns today for follow up.  Son states his father has "white matter disease" in his brain but no new TIA or stroke sx's.  The patient is a resident of [facility] nursing facility.  Son also states his father has less garbled speech since the CEA.  He denies claudication symptoms.  Patient has Negative recent history of TIA or stroke symptom, specifically the patient denies amaurosis fugax or monocular blindness, denies facial drooping, denies hemiplegia, denies receptive or expressive aphasia.    Patient denies claudication symptoms with walking, denies non healing wounds.   Pt Diabetic: Yes, son states his father's glucose control is fairly good  Pt smoker: non smoker   Pt meds include:  Statin : No Betablocker: No  ASA: No  Other anticoagulants/antiplatelets: Plavix   Past Medical History  Diagnosis Date  . Dizziness   . Macular degeneration   . History of TIAs   . Carotid artery occlusion   . Glaucoma   . Stroke     TIA  . Mitral valvular disorder   . Hypertension     takes Amlodipine and Benicar daily  . Heart murmur   . Hoarseness     states pretty much all the time  . Diabetes mellitus     takes Glimepiride daily  . GERD (gastroesophageal reflux disease)     doesn't require meds  . Constipation     occasionally Mag Citrate  . Urinary frequency   . Cancer     prostate;takes Flomax daily  . Nocturia   . Depression     but doesn't require meds  . Anxiety     doesn't take any meds  . Short-term memory loss   . Long-term memory loss   . Aortic insufficiency     mild-moderate AR by echo 07/2011 (Dr. Debara Pickett)  . Urinary tract infection, site not specified   . Orthostatic hypotension   . Dementia   . Other symptoms involving cardiovascular system   . Unspecified transient cerebral ischemia   .  Unspecified vitamin D deficiency   . Malignant neoplasm of prostate   . Unspecified glaucoma   . Senile cataract, unspecified   . Unspecified essential hypertension   . Other abnormal blood chemistry   . Proteinuria   . Complication of anesthesia     bowels, bladder slow to "awaken" after surgery  . Acute kidney failure, unspecified     3/14 no problems now    Social History History  Substance Use Topics  . Smoking status: Never Smoker   . Smokeless tobacco: Never Used  . Alcohol Use: No     Comment: occasionally beer 2-3 times/month    Family History Family History  Problem Relation Age of Onset  . Cancer Brother   . Crohn's disease Son     Surgical History Past Surgical History  Procedure Laterality Date  . Retinal detachment surgery Bilateral 70's 80's  . Cataract extraction Bilateral   . Endarterectomy  09/12/2011    Procedure: ENDARTERECTOMY CAROTID;  Surgeon: Elam Dutch, MD;  Location: [facility] OR;  Service: Vascular;  Laterality: Right;  right carotid artery endarterectomy  with dacron patch angioplasty  . Carotid endarterectomy  09/12/11    RIGHT  cea  . Inguinal hernia repair Right 12/19/2012    Procedure: REPAIR RIGHT INGUINAL  HERNIA ;  Surgeon: Earnstine Regal, MD;  Location: [place];  Service: General;  Laterality: Right;  . Insertion of mesh N/A 12/19/2012    Procedure: INSERTION OF MESH;  Surgeon: Earnstine Regal, MD;  Location: [place];  Service: General;  Laterality: N/A;  . Laceration repair Right 03/22/2013    Procedure: REPAIR MULTIPLE LACERATIONS;  Surgeon: Tennis Must, MD;  Location: [place];  Service: Orthopedics;  Laterality: Right;    No Known Allergies  Current Outpatient Prescriptions  Medication Sig Dispense Refill  . acetaminophen (TYLENOL) 325 MG tablet Take 650 mg by mouth every 6 (six) hours as needed for pain or fever.      Marland Kitchen amLODipine (NORVASC) 10 MG tablet Take 1 tablet (10 mg total) by mouth daily.  30 tablet  6  . bimatoprost (LUMIGAN) 0.01  % SOLN Place 1 drop into both eyes at bedtime.      . calcium carbonate (TUMS) 500 MG chewable tablet Chew 1 tablet by mouth 2 (two) times daily as needed for heartburn (upset stomach).      . docusate sodium (COLACE) 100 MG capsule Take 100 mg by mouth daily.      Marland Kitchen HYDROcodone-acetaminophen (NORCO) 5-325 MG per tablet Take 1 tablet by mouth every 6 (six) hours as needed for moderate pain. 1-2 tabs po q6 hours prn pain  20 tablet  0  . insulin aspart (NOVOLOG) 100 UNIT/ML injection Inject 0-9 Units into the skin 4 (four) times daily -  before meals and at bedtime. Sliding scale:  CBG 70-120 0 units; 121-150 1 unit, 151-200 2 units, 201-250 3 units, 251-300 6 units, 301-350 7 units, 351-400 8 units, >400 9 units & notify MD      . insulin glargine (LANTUS) 100 UNIT/ML injection Inject 12 Units into the skin at bedtime.      Marland Kitchen LORazepam (ATIVAN) 0.5 MG tablet Take 0.5 mg by mouth every 6 (six) hours as needed (agitation).       Marland Kitchen losartan (COZAAR) 50 MG tablet Take 1 tablet (50 mg total) by mouth daily.  30 tablet  3  . nystatin cream (MYCOSTATIN) Apply 1 application topically 2 (two) times daily.      . Tamsulosin HCl (FLOMAX) 0.4 MG CAPS Take 0.4 mg by mouth daily. 1/2 hour following the same meal each day      . timolol (TIMOPTIC) 0.5 % ophthalmic solution Place 1 drop into both eyes 2 (two) times daily.  10 mL  3   No current facility-administered medications for this visit.    Review of Systems : See HPI for pertinent positives and negatives.  Physical Examination  Filed Vitals:   12/24/13 1606 12/24/13 1610  BP: 158/82 158/79  Pulse: 56 55  Resp:  14  Height:  6' 1.5" (1.867 m)  Weight:  200 lb (90.719 kg)  SpO2:  99%   Body mass index is 26.03 kg/(m^2).  General: WDWN male in NAD  GAIT: mildly spastic  Eyes: PERRLA  Pulmonary: CTAB, Negative Rales, Negative rhonchi, & Negative wheezing.  Cardiac: regular Rhythm , Negative Murmurs, trace pretibial pitting edema bilaterally.    VASCULAR EXAM  Carotid Bruits  Left  Right    Negative  Negative   Radial pulses: 2+ palpable and =. Aorta is not palpable   LE Pulses  LEFT  RIGHT   FEMORAL  palpable  palpable   POPLITEAL  not palpable  not palpable   POSTERIOR TIBIAL  not palpable, audible  with Doppler  not palpable, audible with Doppler   DORSALIS PEDIS  ANTERIOR TIBIAL  not palpable, audible with Doppler  not palpable, audible with Doppler    Gastrointestinal: soft, nontender, BS WNL, no r/g, no palpated masses.  Musculoskeletal: mild muscle atrophy/wasting, appropriate for age. M/S 5/5 throughout, Extremities without ischemic changes.  Neurologic: A&O X 2; Appropriate Affect ; SENSATION ;normal; Speech is fluent mildly aphasic, CN 2-12 intact, Pain and light touch intact in extremities, Motor exam as listed above.  Non-Invasive Vascular Imaging CAROTID DUPLEX 12/24/2013   CEREBROVASCULAR DUPLEX EVALUATION    INDICATION: Right carotid endarterectomy 09/12/2011    PREVIOUS INTERVENTION(S):     DUPLEX EXAM:     RIGHT  LEFT  Peak Systolic Velocities (cm/s) End Diastolic Velocities (cm/s) Plaque LOCATION Peak Systolic Velocities (cm/s) End Diastolic Velocities (cm/s) Plaque  46 5  CCA PROXIMAL 66 8   60 8  CCA MID 69 9   52 8  CCA DISTAL 70 9 HT  58 0  ECA 96 6 HT  33 8  ICA PROXIMAL 58 8 HT  54 12  ICA MID 41 8   47 10  ICA DISTAL 50 13     NA ICA / CCA Ratio (PSV) .83  Antegrade  Vertebral Flow Antegrade   696 Brachial Systolic Pressure (mmHg) 295  Within normal limits  Brachial Artery Waveforms Within normal limits     Plaque Morphology:  HM = Homogeneous, HT = Heterogeneous, CP = Calcific Plaque, SP = Smooth Plaque, IP = Irregular Plaque     ADDITIONAL FINDINGS:     IMPRESSION: 1. Widely patent right carotid endarterectomy without evidence of restenosis or hyperplasia.  2. Evidence of <40% left internal carotid artery stenosis. 3. Bilateral vertebral artery is antegrade.  No change since  11/28/2012 carotid Duplex.   Assessment: Ronald Hunt is a 80 y.o. male who presents with asymptomatic widely patent right carotid endarterectomy without evidence of restenosis or hyperplasia.  Evidence of <40% left internal carotid artery stenosis. Bilateral vertebral artery is antegrade. No change since 11/28/2012 carotid Duplex.   Plan: Follow-up in 1 year with Carotid Duplex scan.   I discussed in depth with the patient the nature of atherosclerosis, and emphasized the importance of maximal medical management including strict control of blood pressure, blood glucose, and lipid levels, obtaining regular exercise, and continued cessation of smoking.  The patient is aware that without maximal medical management the underlying atherosclerotic disease process will progress, limiting the benefit of any interventions. The patient was given information about stroke prevention and what symptoms should prompt the patient to seek immediate medical care. Thank you for allowing [place] to participate in this patient's care.  Clemon Chambers, RN, MSN, FNP-C [facility] Office: [phone]  Clinic Physician: Oneida Alar  12/24/2013 4:06 PM

## 2013-12-24 NOTE — Patient Instructions (Signed)
Stroke Prevention Some medical conditions and behaviors are associated with an increased chance of having a stroke. You may prevent a stroke by making healthy choices and managing medical conditions. HOW CAN I REDUCE MY RISK OF HAVING A STROKE?   Stay physically active. Get at least 30 minutes of activity on most or all days.  Do not smoke. It may also be helpful to avoid exposure to secondhand smoke.  Limit alcohol use. Moderate alcohol use is considered to be:  No more than 2 drinks per day for men.  No more than 1 drink per day for nonpregnant women.  Eat healthy foods. This involves:  Eating 5 or more servings of fruits and vegetables a day.  Making dietary changes that address high blood pressure (hypertension), high cholesterol, diabetes, or obesity.  Manage your cholesterol levels.  Making food choices that are high in fiber and low in saturated fat, trans fat, and cholesterol may control cholesterol levels.  Take any prescribed medicines to control cholesterol as directed by your health care provider.  Manage your diabetes.  Controlling your carbohydrate and sugar intake is recommended to manage diabetes.  Take any prescribed medicines to control diabetes as directed by your health care provider.  Control your hypertension.  Making food choices that are low in salt (sodium), saturated fat, trans fat, and cholesterol is recommended to manage hypertension.  Take any prescribed medicines to control hypertension as directed by your health care provider.  Maintain a healthy weight.  Reducing calorie intake and making food choices that are low in sodium, saturated fat, trans fat, and cholesterol are recommended to manage weight.  Stop drug abuse.  Avoid taking birth control pills.  Talk to your health care provider about the risks of taking birth control pills if you are over 35 years old, smoke, get migraines, or have ever had a blood clot.  Get evaluated for sleep  disorders (sleep apnea).  Talk to your health care provider about getting a sleep evaluation if you snore a lot or have excessive sleepiness.  Take medicines only as directed by your health care provider.  For some people, aspirin or blood thinners (anticoagulants) are helpful in reducing the risk of forming abnormal blood clots that can lead to stroke. If you have the irregular heart rhythm of atrial fibrillation, you should be on a blood thinner unless there is a good reason you cannot take them.  Understand all your medicine instructions.  Make sure that other conditions (such as anemia or atherosclerosis) are addressed. SEEK IMMEDIATE MEDICAL CARE IF:   You have sudden weakness or numbness of the face, arm, or leg, especially on one side of the body.  Your face or eyelid droops to one side.  You have sudden confusion.  You have trouble speaking (aphasia) or understanding.  You have sudden trouble seeing in one or both eyes.  You have sudden trouble walking.  You have dizziness.  You have a loss of balance or coordination.  You have a sudden, severe headache with no known cause.  You have new chest pain or an irregular heartbeat. Any of these symptoms may represent a serious problem that is an emergency. Do not wait to see if the symptoms will go away. Get medical help at once. Call your local emergency services (911 in [place] Do not drive yourself to the hospital. Document Released: 04/19/2004 Document Revised: 07/27/2013 Document Reviewed: 09/12/2012 [employer] Patient Information 2015 [employer]. This information is not intended to replace advice given   to you by your health care provider. Make sure you discuss any questions you have with your health care provider.  

## 2013-12-25 NOTE — Addendum Note (Signed)
Addended by: Dorthula Rue L on: 12/25/2013 04:00 PM   Modules accepted: Orders

## 2014-12-28 ENCOUNTER — Encounter: Payer: Self-pay | Admitting: Family

## 2014-12-30 ENCOUNTER — Encounter: Payer: Self-pay | Admitting: Family

## 2014-12-30 ENCOUNTER — Ambulatory Visit (HOSPITAL_COMMUNITY)
Admission: RE | Admit: 2014-12-30 | Discharge: 2014-12-30 | Disposition: A | Discharge status: Home / Self Care | Payer: Medicare Other | Source: Ambulatory Visit | Attending: Family | Admitting: Family

## 2014-12-30 ENCOUNTER — Ambulatory Visit (INDEPENDENT_AMBULATORY_CARE_PROVIDER_SITE_OTHER): Payer: Medicare Other | Admitting: Family

## 2014-12-30 VITALS — BP 138/72 | HR 64 | Temp 97.0°F | Resp 16 | Ht 73.0 in | Wt 202.0 lb

## 2014-12-30 DIAGNOSIS — I6522 Occlusion and stenosis of left carotid artery: Secondary | ICD-10-CM | POA: Diagnosis not present

## 2014-12-30 DIAGNOSIS — I6521 Occlusion and stenosis of right carotid artery: Secondary | ICD-10-CM

## 2014-12-30 DIAGNOSIS — Z48812 Encounter for surgical aftercare following surgery on the circulatory system: Secondary | ICD-10-CM | POA: Insufficient documentation

## 2014-12-30 DIAGNOSIS — Z9889 Other specified postprocedural states: Secondary | ICD-10-CM | POA: Diagnosis not present

## 2014-12-30 NOTE — Progress Notes (Signed)
Filed Vitals:   12/30/14 1534 12/30/14 1537 12/30/14 1543 12/30/14 1544  BP: 148/72 153/77 138/73 138/72  Pulse: 65 68 65 64  Temp:  97 F (36.1 C)    TempSrc:  Oral    Resp:  16    Height:  6\' 1"  (1.854 m)    Weight:  202 lb (91.627 kg)    SpO2:  99%

## 2014-12-30 NOTE — Progress Notes (Signed)
Established Carotid Patient   History of Present Illness  Ronald Hunt is a 81 y.o. male patient of Dr. Oneida Alar who is status post right CEA in June of 2013.  He returns today for follow up.  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   Son states his father has "white matter disease" in his brain but no new TIA or stroke sx's.  The patient is a resident of [facility], memory care unit. Son states pt is doing better at the new facility.  Son also states his father has less garbled speech since the CEA.  He denies claudication symptoms.  Patient has Negative recent history of TIA or stroke symptom, specifically the patient denies amaurosis fugax or monocular blindness, denies facial drooping, denies hemiplegia, denies receptive or expressive aphasia.   Patient denies claudication symptoms with walking, denies non healing wounds.  Pt Diabetic: Yes, son states his father's glucose control is fairly good  Pt smoker: non smoker   Pt meds include:  Statin : No Betablocker: No  ASA: No  Other anticoagulants/antiplatelets: Plavix was stopped due to hematuria which has been under evaluation   Past Medical History  Diagnosis Date  . Dizziness   . Macular degeneration   . History of TIAs   . Carotid artery occlusion   . Glaucoma   . Stroke     TIA  . Mitral valvular disorder   . Hypertension     takes Amlodipine and Benicar daily  . Heart murmur   . Hoarseness     states pretty much all the time  . Diabetes mellitus     takes Glimepiride daily  . GERD (gastroesophageal reflux disease)     doesn't require meds  . Constipation     occasionally Mag Citrate  . Urinary frequency   . Cancer     prostate;takes Flomax daily  . Nocturia   . Depression     but doesn't require meds  . Anxiety     doesn't take any meds  . Short-term memory loss   . Long-term memory loss   . Aortic  insufficiency     mild-moderate AR by echo 07/2011 (Dr. Debara Pickett)  . Urinary tract infection, site not specified   . Orthostatic hypotension   . Dementia   . Other symptoms involving cardiovascular system   . Unspecified transient cerebral ischemia   . Unspecified vitamin D deficiency   . Malignant neoplasm of prostate   . Unspecified glaucoma   . Senile cataract, unspecified   . Unspecified essential hypertension   . Other abnormal blood chemistry   . Proteinuria   . Complication of anesthesia     bowels, bladder slow to "awaken" after surgery  . Acute kidney failure, unspecified     3/14 no problems now    Social History Social History  Substance Use Topics  . Smoking status: Never Smoker   . Smokeless tobacco: Never Used  . Alcohol Use: No     Comment: occasionally beer 2-3 times/month    Family History Family History  Problem Relation Age of Onset  . Cancer Brother   . Crohn's disease Son     Surgical History Past Surgical History  Procedure Laterality Date  . Retinal detachment surgery Bilateral 70's 80's  . Cataract extraction Bilateral   . Endarterectomy  09/12/2011    Procedure: ENDARTERECTOMY CAROTID;  Surgeon: Elam Dutch, MD;  Location: [place];  Service: Vascular;  Laterality: Right;  right carotid artery endarterectomy  with dacron patch angioplasty  . Carotid endarterectomy  09/12/11    RIGHT  cea  . Inguinal hernia repair Right 12/19/2012    Procedure: REPAIR RIGHT INGUINAL HERNIA ;  Surgeon: Earnstine Regal, MD;  Location: [place];  Service: General;  Laterality: Right;  . Insertion of mesh N/A 12/19/2012    Procedure: INSERTION OF MESH;  Surgeon: Earnstine Regal, MD;  Location: [place];  Service: General;  Laterality: N/A;  . Laceration repair Right 03/22/2013    Procedure: REPAIR MULTIPLE LACERATIONS;  Surgeon: Tennis Must, MD;  Location: [place];  Service: Orthopedics;  Laterality: Right;    No Known Allergies  Current Outpatient Prescriptions  Medication  Sig Dispense Refill  . acetaminophen (TYLENOL) 325 MG tablet Take 650 mg by mouth every 6 (six) hours as needed for pain or fever.    Marland Kitchen amLODipine (NORVASC) 10 MG tablet Take 1 tablet (10 mg total) by mouth daily. 30 tablet 6  . bimatoprost (LUMIGAN) 0.01 % SOLN Place 1 drop into both eyes at bedtime.    . calcium carbonate (TUMS) 500 MG chewable tablet Chew 1 tablet by mouth 2 (two) times daily as needed for heartburn (upset stomach).    . clopidogrel (PLAVIX) 75 MG tablet Take 75 mg by mouth daily.    Marland Kitchen docusate sodium (COLACE) 100 MG capsule Take 100 mg by mouth daily.    Marland Kitchen HYDROcodone-acetaminophen (NORCO) 5-325 MG per tablet Take 1 tablet by mouth every 6 (six) hours as needed for moderate pain. 1-2 tabs po q6 hours prn pain 20 tablet 0  . insulin aspart (NOVOLOG) 100 UNIT/ML injection Inject 0-9 Units into the skin 4 (four) times daily -  before meals and at bedtime. Sliding scale:  CBG 70-120 0 units; 121-150 1 unit, 151-200 2 units, 201-250 3 units, 251-300 6 units, 301-350 7 units, 351-400 8 units, >400 9 units & notify MD    . insulin glargine (LANTUS) 100 UNIT/ML injection Inject 12 Units into the skin at bedtime.    Marland Kitchen LORazepam (ATIVAN) 0.5 MG tablet Take 0.5 mg by mouth every 6 (six) hours as needed (agitation).     Marland Kitchen losartan (COZAAR) 50 MG tablet Take 1 tablet (50 mg total) by mouth daily. 30 tablet 3  . nystatin cream (MYCOSTATIN) Apply 1 application topically 2 (two) times daily.    . Tamsulosin HCl (FLOMAX) 0.4 MG CAPS Take 0.4 mg by mouth daily. 1/2 hour following the same meal each day    . timolol (TIMOPTIC) 0.5 % ophthalmic solution Place 1 drop into both eyes 2 (two) times daily. 10 mL 3   No current facility-administered medications for this visit.    Review of Systems : See HPI for pertinent positives and negatives.  Physical Examination  Filed Vitals:   12/30/14 1534 12/30/14 1537 12/30/14 1543 12/30/14 1544  BP: 148/72 153/77 138/73 138/72  Pulse: 65 68 65 64   Temp:  97 F (36.1 C)    TempSrc:  Oral    Resp:  16    Height:  6\' 1"  (1.854 m)    Weight:  202 lb (91.627 kg)    SpO2:  99%     Body mass index is 26.66 kg/(m^2).   General: WDWN male in NAD  GAIT: mildly spastic, slow, deliberate, holding on to his son. Eyes: Left pupil constricts to light, right is non reactive, history of glaucoma, detached retina, macular degeneration.  Pulmonary: CTAB, no rales, no rhonchi, & no wheezing.  Cardiac: regular rhythm, no detected murmur, trace pitting and nonpitting ankle edema bilaterally.   VASCULAR EXAM  Carotid Bruits  Left  Right    Negative  Negative   Radial pulses: 2+ palpable and =. Aorta is not palpable   LE Pulses  LEFT  RIGHT   FEMORAL  palpable  palpable   POPLITEAL  not palpable  not palpable   POSTERIOR TIBIAL  not palpable, audible with Doppler  not palpable, audible with Doppler   DORSALIS PEDIS  ANTERIOR TIBIAL  not palpable, audible with Doppler  not palpable, audible with Doppler    Gastrointestinal: soft, nontender, BS WNL, no r/g, no palpated masses.  Musculoskeletal: mild muscle atrophy/wasting, appropriate for age. M/S 5/5 throughout, Extremities without ischemic changes.  Neurologic: A&O X 2; Appropriate Affect ; SENSATION ;normal; Speech is fluent mildly aphasic, CN 2-12 intact, Pain and light touch intact in extremities, Motor exam as listed above.        Non-Invasive Vascular Imaging CAROTID DUPLEX 12/30/2014   CEREBROVASCULAR DUPLEX EVALUATION    INDICATION: Carotid artery disease    PREVIOUS INTERVENTION(S): Right carotid endarterectomy 09/02/2011    DUPLEX EXAM: Carotid duplex    RIGHT  LEFT  Peak Systolic Velocities (cm/s) End Diastolic Velocities (cm/s) Plaque LOCATION Peak Systolic Velocities (cm/s) End Diastolic Velocities (cm/s) Plaque  75 11 HT CCA PROXIMAL 81 12 -  89 8 HT CCA MID 93 12 -  99 12 - CCA DISTAL 90 11 HT  88 9 - ECA 93 9 HT  44 10 - ICA PROXIMAL  56 8 HT  49 10 - ICA MID 56 11 -  44 11 - ICA DISTAL 59 11 -    N/A ICA / CCA Ratio (PSV) .63  Antegrade Vertebral Flow Antegrade  - Brachial Systolic Pressure (mmHg) -  Triphasic Brachial Artery Waveforms Triphasic    Plaque Morphology:  HM = Homogeneous, HT = Heterogeneous, CP = Calcific Plaque, SP = Smooth Plaque, IP = Irregular Plaque  ADDITIONAL FINDINGS:     IMPRESSION: 1. Widely patent right carotid endarterectomy without evidence of restenosis. 2. Less than 40% left internal carotid artery stenosis.    Compared to the previous exam:  No change since exam of 12/24/2013      Assessment: TYAN DY is a 81 y.o. male who is status post right CEA in June of 2013.    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  Pt has some dementia issues, is oriented x2. Son states pt has had no known strokes or TIA's since 2014. Today's carotid duplex suggests a widely patent right carotid endarterectomy without evidence of restenosis and less than 40% left internal carotid artery stenosis. No change since exam of 12/24/2013.   Pt is ambulatory with a walker at his nursing facility according to pt's son.   Plan: Follow-up in 1 year with Carotid Duplex.  I discussed in depth with the patient the nature of atherosclerosis, and emphasized the importance of maximal medical management including strict control of blood pressure, blood glucose, and lipid levels, obtaining regular exercise, and continued cessation of smoking.  The patient is aware that without maximal medical management the underlying atherosclerotic disease process will progress, limiting the benefit of any interventions. The patient was given information about stroke prevention and what symptoms should prompt the patient to seek immediate medical care. Thank you for  allowing [place] to participate in this patient's care.  Clemon Chambers, RN, MSN,  FNP-C [facility] Office: [phone]  Clinic Physician: Oneida Alar  12/30/2014 3:34 PM

## 2014-12-30 NOTE — Patient Instructions (Signed)
Stroke Prevention Some medical conditions and behaviors are associated with an increased chance of having a stroke. You may prevent a stroke by making healthy choices and managing medical conditions. HOW CAN I REDUCE MY RISK OF HAVING A STROKE?   Stay physically active. Get at least 30 minutes of activity on most or all days.  Do not smoke. It may also be helpful to avoid exposure to secondhand smoke.  Limit alcohol use. Moderate alcohol use is considered to be:  No more than 2 drinks per day for men.  No more than 1 drink per day for nonpregnant women.  Eat healthy foods. This involves:  Eating 5 or more servings of fruits and vegetables a day.  Making dietary changes that address high blood pressure (hypertension), high cholesterol, diabetes, or obesity.  Manage your cholesterol levels.  Making food choices that are high in fiber and low in saturated fat, trans fat, and cholesterol may control cholesterol levels.  Take any prescribed medicines to control cholesterol as directed by your health care provider.  Manage your diabetes.  Controlling your carbohydrate and sugar intake is recommended to manage diabetes.  Take any prescribed medicines to control diabetes as directed by your health care provider.  Control your hypertension.  Making food choices that are low in salt (sodium), saturated fat, trans fat, and cholesterol is recommended to manage hypertension.  Ask your health care provider if you need treatment to lower your blood pressure. Take any prescribed medicines to control hypertension as directed by your health care provider.  If you are 18-39 years of age, have your blood pressure checked every 3-5 years. If you are 40 years of age or older, have your blood pressure checked every year.  Maintain a healthy weight.  Reducing calorie intake and making food choices that are low in sodium, saturated fat, trans fat, and cholesterol are recommended to manage  weight.  Stop drug abuse.  Avoid taking birth control pills.  Talk to your health care provider about the risks of taking birth control pills if you are over 35 years old, smoke, get migraines, or have ever had a blood clot.  Get evaluated for sleep disorders (sleep apnea).  Talk to your health care provider about getting a sleep evaluation if you snore a lot or have excessive sleepiness.  Take medicines only as directed by your health care provider.  For some people, aspirin or blood thinners (anticoagulants) are helpful in reducing the risk of forming abnormal blood clots that can lead to stroke. If you have the irregular heart rhythm of atrial fibrillation, you should be on a blood thinner unless there is a good reason you cannot take them.  Understand all your medicine instructions.  Make sure that other conditions (such as anemia or atherosclerosis) are addressed. SEEK IMMEDIATE MEDICAL CARE IF:   You have sudden weakness or numbness of the face, arm, or leg, especially on one side of the body.  Your face or eyelid droops to one side.  You have sudden confusion.  You have trouble speaking (aphasia) or understanding.  You have sudden trouble seeing in one or both eyes.  You have sudden trouble walking.  You have dizziness.  You have a loss of balance or coordination.  You have a sudden, severe headache with no known cause.  You have new chest pain or an irregular heartbeat. Any of these symptoms may represent a serious problem that is an emergency. Do not wait to see if the symptoms will   go away. Get medical help at once. Call your local emergency services (911 in [place] Do not drive yourself to the hospital.   This information is not intended to replace advice given to you by your health care provider. Make sure you discuss any questions you have with your health care provider.   Document Released: 04/19/2004 Document Revised: 04/02/2014 Document Reviewed:  09/12/2012 Elsevier Interactive Patient Education 2016 Elsevier Inc.  

## 2014-12-31 NOTE — Addendum Note (Signed)
Addended by: Dorthula Rue L on: 12/31/2014 11:16 AM   Modules accepted: Orders

## 2015-07-19 ENCOUNTER — Encounter (HOSPITAL_COMMUNITY): Payer: Self-pay

## 2015-07-19 ENCOUNTER — Emergency Department (HOSPITAL_COMMUNITY)

## 2015-07-19 ENCOUNTER — Inpatient Hospital Stay (HOSPITAL_COMMUNITY)
Admission: EM | Admit: 2015-07-19 | Discharge: 2015-07-21 | DRG: 536 | Disposition: A | Discharge status: Skilled Nursing Facility | Attending: Internal Medicine | Admitting: Internal Medicine

## 2015-07-19 DIAGNOSIS — H3530 Unspecified macular degeneration: Secondary | ICD-10-CM | POA: Diagnosis present

## 2015-07-19 DIAGNOSIS — D649 Anemia, unspecified: Secondary | ICD-10-CM | POA: Diagnosis present

## 2015-07-19 DIAGNOSIS — Y92128 Other place in nursing home as the place of occurrence of the external cause: Secondary | ICD-10-CM | POA: Diagnosis not present

## 2015-07-19 DIAGNOSIS — N189 Chronic kidney disease, unspecified: Secondary | ICD-10-CM | POA: Diagnosis not present

## 2015-07-19 DIAGNOSIS — F0391 Unspecified dementia with behavioral disturbance: Secondary | ICD-10-CM | POA: Diagnosis not present

## 2015-07-19 DIAGNOSIS — Z515 Encounter for palliative care: Secondary | ICD-10-CM | POA: Diagnosis present

## 2015-07-19 DIAGNOSIS — Z66 Do not resuscitate: Secondary | ICD-10-CM | POA: Diagnosis present

## 2015-07-19 DIAGNOSIS — R296 Repeated falls: Secondary | ICD-10-CM | POA: Diagnosis present

## 2015-07-19 DIAGNOSIS — C649 Malignant neoplasm of unspecified kidney, except renal pelvis: Secondary | ICD-10-CM | POA: Diagnosis present

## 2015-07-19 DIAGNOSIS — S72001A Fracture of unspecified part of neck of right femur, initial encounter for closed fracture: Principal | ICD-10-CM | POA: Diagnosis present

## 2015-07-19 DIAGNOSIS — C61 Malignant neoplasm of prostate: Secondary | ICD-10-CM | POA: Diagnosis present

## 2015-07-19 DIAGNOSIS — H409 Unspecified glaucoma: Secondary | ICD-10-CM | POA: Diagnosis present

## 2015-07-19 DIAGNOSIS — Z79899 Other long term (current) drug therapy: Secondary | ICD-10-CM

## 2015-07-19 DIAGNOSIS — E1122 Type 2 diabetes mellitus with diabetic chronic kidney disease: Secondary | ICD-10-CM | POA: Diagnosis present

## 2015-07-19 DIAGNOSIS — K219 Gastro-esophageal reflux disease without esophagitis: Secondary | ICD-10-CM | POA: Diagnosis present

## 2015-07-19 DIAGNOSIS — K5900 Constipation, unspecified: Secondary | ICD-10-CM | POA: Diagnosis present

## 2015-07-19 DIAGNOSIS — F0390 Unspecified dementia without behavioral disturbance: Secondary | ICD-10-CM | POA: Diagnosis present

## 2015-07-19 DIAGNOSIS — Y9301 Activity, walking, marching and hiking: Secondary | ICD-10-CM | POA: Diagnosis present

## 2015-07-19 DIAGNOSIS — E860 Dehydration: Secondary | ICD-10-CM | POA: Diagnosis not present

## 2015-07-19 DIAGNOSIS — Z8673 Personal history of transient ischemic attack (TIA), and cerebral infarction without residual deficits: Secondary | ICD-10-CM

## 2015-07-19 DIAGNOSIS — I10 Essential (primary) hypertension: Secondary | ICD-10-CM | POA: Diagnosis present

## 2015-07-19 DIAGNOSIS — N183 Chronic kidney disease, stage 3 (moderate): Secondary | ICD-10-CM | POA: Diagnosis present

## 2015-07-19 DIAGNOSIS — E119 Type 2 diabetes mellitus without complications: Secondary | ICD-10-CM

## 2015-07-19 DIAGNOSIS — Z809 Family history of malignant neoplasm, unspecified: Secondary | ICD-10-CM

## 2015-07-19 DIAGNOSIS — W19XXXA Unspecified fall, initial encounter: Secondary | ICD-10-CM | POA: Diagnosis not present

## 2015-07-19 DIAGNOSIS — I129 Hypertensive chronic kidney disease with stage 1 through stage 4 chronic kidney disease, or unspecified chronic kidney disease: Secondary | ICD-10-CM | POA: Diagnosis present

## 2015-07-19 DIAGNOSIS — I6529 Occlusion and stenosis of unspecified carotid artery: Secondary | ICD-10-CM | POA: Diagnosis present

## 2015-07-19 DIAGNOSIS — F03918 Unspecified dementia, unspecified severity, with other behavioral disturbance: Secondary | ICD-10-CM | POA: Diagnosis present

## 2015-07-19 DIAGNOSIS — N179 Acute kidney failure, unspecified: Secondary | ICD-10-CM | POA: Diagnosis present

## 2015-07-19 DIAGNOSIS — I639 Cerebral infarction, unspecified: Secondary | ICD-10-CM | POA: Diagnosis present

## 2015-07-19 LAB — RETICULOCYTES
RBC.: 2.99 MIL/uL — AB (ref 4.22–5.81)
RETIC CT PCT: 1.4 % (ref 0.4–3.1)
Retic Count, Absolute: 41.9 10*3/uL (ref 19.0–186.0)

## 2015-07-19 LAB — MAGNESIUM: Magnesium: 2.3 mg/dL (ref 1.7–2.4)

## 2015-07-19 LAB — CBC WITH DIFFERENTIAL/PLATELET
Basophils Absolute: 0 10*3/uL (ref 0.0–0.1)
Basophils Relative: 0 %
EOS PCT: 5 %
Eosinophils Absolute: 0.5 10*3/uL (ref 0.0–0.7)
HCT: 26.1 % — ABNORMAL LOW (ref 39.0–52.0)
Hemoglobin: 8.3 g/dL — ABNORMAL LOW (ref 13.0–17.0)
LYMPHS ABS: 0.8 10*3/uL (ref 0.7–4.0)
LYMPHS PCT: 9 %
MCH: 27.1 pg (ref 26.0–34.0)
MCHC: 31.8 g/dL (ref 30.0–36.0)
MCV: 85.3 fL (ref 78.0–100.0)
MONO ABS: 0.8 10*3/uL (ref 0.1–1.0)
Monocytes Relative: 8 %
Neutro Abs: 7 10*3/uL (ref 1.7–7.7)
Neutrophils Relative %: 78 %
PLATELETS: 159 10*3/uL (ref 150–400)
RBC: 3.06 MIL/uL — AB (ref 4.22–5.81)
RDW: 16.6 % — ABNORMAL HIGH (ref 11.5–15.5)
WBC: 9 10*3/uL (ref 4.0–10.5)

## 2015-07-19 LAB — COMPREHENSIVE METABOLIC PANEL
ALBUMIN: 3.1 g/dL — AB (ref 3.5–5.0)
ALT: 16 U/L — ABNORMAL LOW (ref 17–63)
AST: 16 U/L (ref 15–41)
Alkaline Phosphatase: 71 U/L (ref 38–126)
Anion gap: 9 (ref 5–15)
BUN: 42 mg/dL — AB (ref 6–20)
CHLORIDE: 110 mmol/L (ref 101–111)
CO2: 21 mmol/L — ABNORMAL LOW (ref 22–32)
Calcium: 8.4 mg/dL — ABNORMAL LOW (ref 8.9–10.3)
Creatinine, Ser: 3.22 mg/dL — ABNORMAL HIGH (ref 0.61–1.24)
GFR calc Af Amer: 18 mL/min — ABNORMAL LOW (ref >60)
GFR calc non Af Amer: 16 mL/min — ABNORMAL LOW (ref >60)
GLUCOSE: 124 mg/dL — AB (ref 65–99)
POTASSIUM: 5 mmol/L (ref 3.5–5.1)
SODIUM: 140 mmol/L (ref 135–145)
Total Bilirubin: 0.6 mg/dL (ref 0.3–1.2)
Total Protein: 6.6 g/dL (ref 6.5–8.1)

## 2015-07-19 LAB — TYPE AND SCREEN
ABO/RH(D): O POS
ANTIBODY SCREEN: NEGATIVE

## 2015-07-19 MED ORDER — SENNOSIDES-DOCUSATE SODIUM 8.6-50 MG PO TABS: 1.0000 | ORAL_TABLET | Freq: Every evening | ORAL | Status: DC | PRN

## 2015-07-19 MED ORDER — SODIUM CHLORIDE 0.9 % IV SOLN
INTRAVENOUS | Status: DC
  Administered 2015-07-19: 21:00:00 via INTRAVENOUS

## 2015-07-19 MED ORDER — NYSTATIN 100000 UNIT/GM EX CREA
1.0000 | TOPICAL_CREAM | Freq: Two times a day (BID) | CUTANEOUS | Status: DC
Start: 2015-07-19 — End: 2015-07-21
  Administered 2015-07-20 – 2015-07-21 (×3): 1 via TOPICAL
  Filled 2015-07-19 (×2): qty 15

## 2015-07-19 MED ORDER — LORAZEPAM 0.5 MG PO TABS
0.5000 mg | ORAL_TABLET | Freq: Every day | ORAL | Status: DC | PRN
  Administered 2015-07-20: 0.5 mg via ORAL
  Filled 2015-07-19: qty 1

## 2015-07-19 MED ORDER — NYSTATIN 100000 UNIT/GM EX POWD
Freq: Two times a day (BID) | CUTANEOUS | Status: DC
  Administered 2015-07-19 – 2015-07-21 (×4): via TOPICAL
  Filled 2015-07-19: qty 15

## 2015-07-19 MED ORDER — ONDANSETRON HCL 4 MG/2ML IJ SOLN: 4.0000 mg | Freq: Four times a day (QID) | INTRAMUSCULAR | Status: DC | PRN

## 2015-07-19 MED ORDER — ONDANSETRON HCL 4 MG PO TABS: 4.0000 mg | ORAL_TABLET | Freq: Four times a day (QID) | ORAL | Status: DC | PRN

## 2015-07-19 MED ORDER — MORPHINE SULFATE (PF) 2 MG/ML IV SOLN
0.5000 mg | INTRAVENOUS | Status: DC | PRN
  Administered 2015-07-19 – 2015-07-20 (×2): 0.5 mg via INTRAVENOUS
  Filled 2015-07-19 (×2): qty 1

## 2015-07-19 MED ORDER — HALOPERIDOL 2 MG PO TABS
2.0000 mg | ORAL_TABLET | ORAL | Status: DC | PRN
  Administered 2015-07-21: 2 mg via ORAL
  Filled 2015-07-19 (×2): qty 1

## 2015-07-19 MED ORDER — LATANOPROST 0.005 % OP SOLN
1.0000 [drp] | Freq: Every day | OPHTHALMIC | Status: DC
  Administered 2015-07-19 – 2015-07-20 (×2): 1 [drp] via OPHTHALMIC
  Filled 2015-07-19: qty 2.5

## 2015-07-19 MED ORDER — HYDROCODONE-ACETAMINOPHEN 5-325 MG PO TABS
1.0000 | ORAL_TABLET | Freq: Four times a day (QID) | ORAL | Status: DC | PRN
  Administered 2015-07-19: 1 via ORAL
  Administered 2015-07-21: 2 via ORAL
  Filled 2015-07-19 (×2): qty 2

## 2015-07-19 MED ORDER — TIMOLOL MALEATE 0.5 % OP SOLN
1.0000 [drp] | Freq: Two times a day (BID) | OPHTHALMIC | Status: DC
  Administered 2015-07-19 – 2015-07-21 (×4): 1 [drp] via OPHTHALMIC
  Filled 2015-07-19: qty 5

## 2015-07-19 MED ORDER — DOCUSATE SODIUM 100 MG PO CAPS
100.0000 mg | ORAL_CAPSULE | Freq: Every day | ORAL | Status: DC
  Administered 2015-07-20: 100 mg via ORAL

## 2015-07-19 MED ORDER — TAMSULOSIN HCL 0.4 MG PO CAPS
0.4000 mg | ORAL_CAPSULE | Freq: Every day | ORAL | Status: DC
  Administered 2015-07-20: 0.4 mg via ORAL
  Filled 2015-07-19 (×2): qty 1

## 2015-07-19 MED ORDER — DIVALPROEX SODIUM 125 MG PO CSDR
125.0000 mg | DELAYED_RELEASE_CAPSULE | Freq: Two times a day (BID) | ORAL | Status: DC
  Administered 2015-07-19 – 2015-07-20 (×3): 125 mg via ORAL
  Filled 2015-07-19 (×5): qty 1

## 2015-07-19 MED ORDER — SENNA 8.6 MG PO TABS
1.0000 | ORAL_TABLET | Freq: Two times a day (BID) | ORAL | Status: DC
  Administered 2015-07-19 – 2015-07-20 (×2): 8.6 mg via ORAL

## 2015-07-19 MED ORDER — FENTANYL CITRATE (PF) 100 MCG/2ML IJ SOLN
50.0000 ug | Freq: Once | INTRAMUSCULAR | Status: AC
  Administered 2015-07-19: 50 ug via INTRAVENOUS
  Filled 2015-07-19: qty 2

## 2015-07-19 MED ORDER — ACETAMINOPHEN 650 MG RE SUPP: 650.0000 mg | Freq: Four times a day (QID) | RECTAL | Status: DC | PRN

## 2015-07-19 MED ORDER — SODIUM CHLORIDE 0.9 % IV SOLN
INTRAVENOUS | Status: AC
  Filled 2015-07-19 (×2): qty 1000

## 2015-07-19 MED ORDER — AMLODIPINE BESYLATE 10 MG PO TABS
10.0000 mg | ORAL_TABLET | Freq: Every day | ORAL | Status: DC
  Administered 2015-07-20: 10 mg via ORAL
  Filled 2015-07-19 (×2): qty 1

## 2015-07-19 MED ORDER — MORPHINE SULFATE (PF) 2 MG/ML IV SOLN
INTRAVENOUS | Status: AC
  Filled 2015-07-19: qty 1

## 2015-07-19 MED ORDER — HYDROCODONE-ACETAMINOPHEN 5-325 MG PO TABS
1.0000 | ORAL_TABLET | ORAL | Status: DC | PRN
  Administered 2015-07-20: 1 via ORAL
  Filled 2015-07-19: qty 1

## 2015-07-19 MED ORDER — SODIUM CHLORIDE 0.9 % IV BOLUS (SEPSIS)
500.0000 mL | Freq: Once | INTRAVENOUS | Status: AC
  Administered 2015-07-19: 500 mL via INTRAVENOUS

## 2015-07-19 MED ORDER — ACETAMINOPHEN 325 MG PO TABS
650.0000 mg | ORAL_TABLET | Freq: Four times a day (QID) | ORAL | Status: DC | PRN
Start: 2015-07-19 — End: 2015-07-21

## 2015-07-19 MED ORDER — FAMOTIDINE 20 MG PO TABS
20.0000 mg | ORAL_TABLET | Freq: Two times a day (BID) | ORAL | Status: DC
  Administered 2015-07-19 – 2015-07-20 (×3): 20 mg via ORAL
  Filled 2015-07-19 (×7): qty 1

## 2015-07-19 MED ORDER — SORBITOL 70 % SOLN
30.0000 mL | Freq: Every day | Status: DC | PRN
  Filled 2015-07-19: qty 30

## 2015-07-19 NOTE — Consult Note (Signed)
ORTHOPAEDIC CONSULTATION  REQUESTING PHYSICIAN: Quintella Reichert, MD  Chief Complaint: right hip fracture  HPI: Ronald Hunt is a 82 y.o. male who complains of an unwitnessed fall suffered a day ago. He is on hospice at [facility] care for renal cell cancer and prostate cancer. He has had multiple falls he is a minimal household ambulator with a walker.  Past Medical History  Diagnosis Date  . Dizziness   . Macular degeneration   . History of TIAs   . Carotid artery occlusion   . Glaucoma   . Stroke [facility])     TIA  . Mitral valvular disorder   . Hypertension     takes Amlodipine and Benicar daily  . Heart murmur   . Hoarseness     states pretty much all the time  . Diabetes mellitus     takes Glimepiride daily  . GERD (gastroesophageal reflux disease)     doesn't require meds  . Constipation     occasionally Mag Citrate  . Urinary frequency   . Cancer ([place])     prostate;takes Flomax daily  . Nocturia   . Depression     but doesn't require meds  . Anxiety     doesn't take any meds  . Short-term memory loss   . Long-term memory loss   . Aortic insufficiency     mild-moderate AR by echo 07/2011 (Dr. Debara Pickett)  . Urinary tract infection, site not specified   . Orthostatic hypotension   . Dementia   . Other symptoms involving cardiovascular system   . Unspecified transient cerebral ischemia   . Unspecified vitamin D deficiency   . Malignant neoplasm of prostate ([place])   . Unspecified glaucoma   . Senile cataract, unspecified   . Unspecified essential hypertension   . Other abnormal blood chemistry   . Proteinuria   . Complication of anesthesia     bowels, bladder slow to "awaken" after surgery  . Acute kidney failure, unspecified ([place])     3/14 no problems now   Past Surgical History  Procedure Laterality Date  . Retinal detachment surgery Bilateral 70's 80's  . Cataract extraction Bilateral   . Endarterectomy  09/12/2011    Procedure: ENDARTERECTOMY  CAROTID;  Surgeon: Elam Dutch, MD;  Location: [facility] OR;  Service: Vascular;  Laterality: Right;  right carotid artery endarterectomy  with dacron patch angioplasty  . Carotid endarterectomy  09/12/11    RIGHT  cea  . Inguinal hernia repair Right 12/19/2012    Procedure: REPAIR RIGHT INGUINAL HERNIA ;  Surgeon: Earnstine Regal, MD;  Location: [place];  Service: General;  Laterality: Right;  . Insertion of mesh N/A 12/19/2012    Procedure: INSERTION OF MESH;  Surgeon: Earnstine Regal, MD;  Location: [place];  Service: General;  Laterality: N/A;  . Laceration repair Right 03/22/2013    Procedure: REPAIR MULTIPLE LACERATIONS;  Surgeon: Tennis Must, MD;  Location: [place];  Service: Orthopedics;  Laterality: Right;   Social History   Social History  . Marital Status: Single    Spouse Name: N/A  . Number of Children: N/A  . Years of Education: N/A   Social History Main Topics  . Smoking status: Never Smoker   . Smokeless tobacco: Never Used  . Alcohol Use: No     Comment: occasionally beer 2-3 times/month  . Drug Use: No  . Sexual Activity: No   Other Topics Concern  . None  Social History Narrative   Family History  Problem Relation Age of Onset  . Cancer Brother   . Crohn's disease Son    No Known Allergies Prior to Admission medications   Medication Sig Start Date End Date Taking? Authorizing Provider  acetaminophen (TYLENOL) 325 MG tablet Take 650 mg by mouth 4 (four) times daily. May take an additional 351m every 12 hours as needed for pain/fever   Yes Historical Provider, MD  amLODipine (NORVASC) 10 MG tablet Take 1 tablet (10 mg total) by mouth daily. 09/08/12  Yes JLauree Chandler NP  bimatoprost (LUMIGAN) 0.01 % SOLN Place 1 drop into both eyes at bedtime.   Yes Historical Provider, MD  divalproex (DEPAKOTE SPRINKLE) 125 MG capsule Take 125 mg by mouth every 12 (twelve) hours.   Yes Historical Provider, MD  docusate sodium (COLACE) 100 MG capsule Take 100 mg by mouth daily.    Yes Historical Provider, MD  haloperidol (HALDOL) 2 MG tablet Take 2 mg by mouth every 4 (four) hours as needed for agitation (anxiety or restlessness).   Yes Historical Provider, MD  HYDROcodone-acetaminophen (NORCO) 5-325 MG per tablet Take 1 tablet by mouth every 6 (six) hours as needed for moderate pain. 1-2 tabs po q6 hours prn pain Patient taking differently: Take 1 tablet by mouth every 4 (four) hours as needed for moderate pain.  03/22/13  Yes KLeanora Cover MD  LORazepam (ATIVAN) 0.5 MG tablet Take 0.5 mg by mouth daily as needed (agitation).  10/07/12  Yes Historical Provider, MD  losartan (COZAAR) 100 MG tablet Take 100 mg by mouth daily.  12/04/14  Yes Historical Provider, MD  nystatin (NYSTATIN) powder Apply topically. Apply topically to abdominal folds twice daily as needed for rash   Yes Historical Provider, MD  nystatin cream (MYCOSTATIN) Apply 1 application topically 2 (two) times daily. Apply to groin and gluteals   Yes Historical Provider, MD  ranitidine (ZANTAC) 75 MG tablet Take 75 mg by mouth 2 (two) times daily.   Yes Historical Provider, MD  Silicone-Lanolin-Zinc Oxide (SELAN + ZINC OXIDE EX) Apply topically. Apply topically to buttocks with each brief change and with bath   Yes Historical Provider, MD  sulfamethoxazole-trimethoprim (BACTRIM DS,SEPTRA DS) 800-160 MG tablet Take 1 tablet by mouth 2 (two) times daily.   Yes Historical Provider, MD  Tamsulosin HCl (FLOMAX) 0.4 MG CAPS Take 0.4 mg by mouth daily.  12/23/10  Yes Historical Provider, MD  timolol (TIMOPTIC) 0.5 % ophthalmic solution Place 1 drop into both eyes 2 (two) times daily. 09/30/12  Yes JLauree Chandler NP   Dg Chest 1 View  07/19/2015  CLINICAL DATA:  Preoperative examination for patient with a right hip fracture. Initial encounter. EXAM: CHEST 1 VIEW COMPARISON:  PA and lateral chest 03/22/2013 and 06/20/2012. FINDINGS: The patient is slightly rotated on the study. Lung volumes are low with some crowding of the  bronchovascular structures. No consolidative process, pneumothorax or effusion. Heart size is normal. IMPRESSION: No acute finding in a low volume chest. Electronically Signed   By: TInge RiseM.D.   On: 07/19/2015 15:59   Dg Hip Unilat With Pelvis 2-3 Views Right  07/19/2015  CLINICAL DATA:  Right hip shortening after fall. EXAM: DG HIP (WITH OR WITHOUT PELVIS) 2-3V RIGHT COMPARISON:  None. FINDINGS: Displaced right femoral neck fracture with proximal migration of the femoral shaft. Femoral head remains seated. Remaining bony pelvis including the pubic rami are intact. No additional acute fracture. IMPRESSION: Displaced right femoral  neck fracture. Electronically Signed   By: Jeb Levering M.D.   On: 07/19/2015 16:04    Positive ROS: All other systems have been reviewed and were otherwise negative with the exception of those mentioned in the HPI and as above.  Labs cbc  Recent Labs  07/19/15 1541  WBC 9.0  HGB 8.3*  HCT 26.1*  PLT 159    Labs inflam No results for input(s): CRP in the last 72 hours.  Invalid input(s): ESR  Labs coag No results for input(s): INR, PTT in the last 72 hours.  Invalid input(s): PT   Recent Labs  07/19/15 1541  NA 140  K 5.0  CL 110  CO2 21*  GLUCOSE 124*  BUN 42*  CREATININE 3.22*  CALCIUM 8.4*    Physical Exam: Filed Vitals:   07/19/15 1404 07/19/15 1630  BP: 120/58 115/64  Pulse: 59 56  Temp: 98.2 F (36.8 C) 98.2 F (36.8 C)  Resp: 16 15   General: Alert, no acute distress Cardiovascular: No pedal edema Respiratory: No cyanosis, no use of accessory musculature GI: No organomegaly, abdomen is soft and non-tender Skin: No lesions in the area of chief complaint other than those listed below in MSK exam.  Neurologic: Sensation intact distally save for the below mentioned MSK exam Psychiatric: demented Lymphatic: No axillary or cervical lymphadenopathy  MUSCULOSKELETAL:  His right lower extremity shortened and  external rotated he has 2+ pulses sensation is grossly intact in his intact motion of his toes Other extremities are atraumatic with painless ROM and NVI.  Assessment: Right femoral neck fracture  Plan: Given his severe poor prognosis hospice status minimal ambulation for long discussion with the family about the risks and benefits of surgery. At this time they're leaning towards not operative management would need a hospice consult. Will leave him at [facility] for now sugar went to go forward with surgery are to bring the cone and perform that on Thursday morning I will touch base again with the family on Wednesday but again they are leaning towards nonoperative management.  Postoperative management hospice would be recommended he could weight-bear is improved this would likely be 4-6 weeks from now   Renette Butters, MD Cell [phone]   07/19/2015 6:52 PM

## 2015-07-19 NOTE — ED Notes (Signed)
Bed: WA02 Expected date:  Expected time:  Means of arrival:  Comments: EMS-fall-pelvic FX

## 2015-07-19 NOTE — H&P (Signed)
History and Physical    Ronald Hunt [id number] DOB: 02-22-1924 DOA: 07/19/2015  Referring MD/NP/PA: Dr Ralene Bathe PCP: Reymundo Poll, MD Outpatient Specialists: Patient coming from: [facility] being followed by hospice  Chief Complaint: Fall/hip pain/femoral fracture  HPI: Ronald Hunt is a 82 y.o. male with medical history significant of carotid artery stenosis status post right carotid endarterectomy 09/12/2011, renal cell carcinoma, prostate cancer, history of TIAs, diabetes, depression/anxiety, dementia in a memory care unit, glaucoma, mild aortic insufficiency who presents to the ED with complaints of a hip fracture. Patient with dementia and a such history obtained from patient's son and daughter-in-law. Per patient's son and daughter-in-law patient was walking back from dinner to his room when he fell outside his room. Hospice nurse was called and prescription for pain medication was made however medication did not come until around 4:30 on the day of admission. X-rays were done of the patient's hip which are consistent with a right femoral neck fracture. Patient was subsequently sent to the ED.  Family patient did not have any syncopal episodes, no fever, no chills, no nausea, no vomiting, no abdominal pain, no diarrhea, no constipation, no chest pain, no shortness of breath, no melena, no hematemesis, no hematochezia, no cough. Patient uses a walker to get around per family. [facility] were called to admit the patient for further evaluation and management.  ED Course: Patient was seen in the ED chest x-ray done was negative for any acute infiltrate. Compressive metabolic profile had a BUN of 42 creatinine of 3.22 with a bicarbonate of 21 albumin of 3.1 otherwise was within normal limits. CBC had a hemoglobin of 8.3 otherwise was within normal limits. EKG done showed a prolonged PR interval with right bundle branch block and left anterior fascicular block which was  similar to prior EKG. Plain films of the right hip was done which showed a displaced right femoral neck fracture. Orthopedics were consulted.  Review of Systems: As per HPI otherwise 10 point review of systems negative.   Past Medical History  Diagnosis Date  . Dizziness   . Macular degeneration   . History of TIAs   . Carotid artery occlusion   . Glaucoma   . Stroke [facility])     TIA  . Mitral valvular disorder   . Hypertension     takes Amlodipine and Benicar daily  . Heart murmur   . Hoarseness     states pretty much all the time  . Diabetes mellitus     takes Glimepiride daily  . GERD (gastroesophageal reflux disease)     doesn't require meds  . Constipation     occasionally Mag Citrate  . Urinary frequency   . Cancer ([place])     prostate;takes Flomax daily  . Nocturia   . Depression     but doesn't require meds  . Anxiety     doesn't take any meds  . Short-term memory loss   . Long-term memory loss   . Aortic insufficiency     mild-moderate AR by echo 07/2011 (Dr. Debara Pickett)  . Urinary tract infection, site not specified   . Orthostatic hypotension   . Dementia   . Other symptoms involving cardiovascular system   . Unspecified transient cerebral ischemia   . Unspecified vitamin D deficiency   . Malignant neoplasm of prostate ([place])   . Unspecified glaucoma   . Senile cataract, unspecified   . Unspecified essential hypertension   . Other abnormal blood chemistry   .  Proteinuria   . Complication of anesthesia     bowels, bladder slow to "awaken" after surgery  . Acute kidney failure, unspecified ([place])     3/14 no problems now    Past Surgical History  Procedure Laterality Date  . Retinal detachment surgery Bilateral 70's 80's  . Cataract extraction Bilateral   . Endarterectomy  09/12/2011    Procedure: ENDARTERECTOMY CAROTID;  Surgeon: Elam Dutch, MD;  [facility];  Service: Vascular;  Laterality: Right;  right carotid artery endarterectomy  with dacron  patch angioplasty  . Carotid endarterectomy  09/12/11    RIGHT  cea  . Inguinal hernia repair Right 12/19/2012    Procedure: REPAIR RIGHT INGUINAL HERNIA ;  Surgeon: Earnstine Regal, MD;  Location: [place];  Service: General;  Laterality: Right;  . Insertion of mesh N/A 12/19/2012    Procedure: INSERTION OF MESH;  Surgeon: Earnstine Regal, MD;  Location: [place];  Service: General;  Laterality: N/A;  . Laceration repair Right 03/22/2013    Procedure: REPAIR MULTIPLE LACERATIONS;  Surgeon: Tennis Must, MD;  Location: [place];  Service: Orthopedics;  Laterality: Right;     reports that he has never smoked. He has never used smokeless tobacco. He reports that he does not drink alcohol or use illicit drugs.  No Known Allergies  Family History  Problem Relation Age of Onset  . Cancer Brother   . Crohn's disease Son    Family history reviewed and not pertinent   Prior to Admission medications   Medication Sig Start Date End Date Taking? Authorizing Provider  acetaminophen (TYLENOL) 325 MG tablet Take 650 mg by mouth 4 (four) times daily. May take an additional 325mg  every 12 hours as needed for pain/fever   Yes Historical Provider, MD  amLODipine (NORVASC) 10 MG tablet Take 1 tablet (10 mg total) by mouth daily. 09/08/12  Yes Lauree Chandler, NP  bimatoprost (LUMIGAN) 0.01 % SOLN Place 1 drop into both eyes at bedtime.   Yes Historical Provider, MD  divalproex (DEPAKOTE SPRINKLE) 125 MG capsule Take 125 mg by mouth every 12 (twelve) hours.   Yes Historical Provider, MD  docusate sodium (COLACE) 100 MG capsule Take 100 mg by mouth daily.   Yes Historical Provider, MD  haloperidol (HALDOL) 2 MG tablet Take 2 mg by mouth every 4 (four) hours as needed for agitation (anxiety or restlessness).   Yes Historical Provider, MD  HYDROcodone-acetaminophen (NORCO) 5-325 MG per tablet Take 1 tablet by mouth every 6 (six) hours as needed for moderate pain. 1-2 tabs po q6 hours prn pain Patient taking differently:  Take 1 tablet by mouth every 4 (four) hours as needed for moderate pain.  03/22/13  Yes Leanora Cover, MD  LORazepam (ATIVAN) 0.5 MG tablet Take 0.5 mg by mouth daily as needed (agitation).  10/07/12  Yes Historical Provider, MD  losartan (COZAAR) 100 MG tablet Take 100 mg by mouth daily.  12/04/14  Yes Historical Provider, MD  nystatin (NYSTATIN) powder Apply topically. Apply topically to abdominal folds twice daily as needed for rash   Yes Historical Provider, MD  nystatin cream (MYCOSTATIN) Apply 1 application topically 2 (two) times daily. Apply to groin and gluteals   Yes Historical Provider, MD  ranitidine (ZANTAC) 75 MG tablet Take 75 mg by mouth 2 (two) times daily.   Yes Historical Provider, MD  Silicone-Lanolin-Zinc Oxide (SELAN + ZINC OXIDE EX) Apply topically. Apply topically to buttocks with each brief change and with  bath   Yes Historical Provider, MD  sulfamethoxazole-trimethoprim (BACTRIM DS,SEPTRA DS) 800-160 MG tablet Take 1 tablet by mouth 2 (two) times daily.   Yes Historical Provider, MD  Tamsulosin HCl (FLOMAX) 0.4 MG CAPS Take 0.4 mg by mouth daily.  12/23/10  Yes Historical Provider, MD  timolol (TIMOPTIC) 0.5 % ophthalmic solution Place 1 drop into both eyes 2 (two) times daily. 09/30/12  Yes Lauree Chandler, NP    Physical Exam: Filed Vitals:   07/19/15 1404 07/19/15 1410 07/19/15 1630  BP: 120/58  115/64  Pulse: 59  56  Temp: 98.2 F (36.8 C)  98.2 F (36.8 C)  TempSrc: Oral  Oral  Resp: 16  15  Height:  6' (1.829 m)   Weight:  86.183 kg (190 lb)   SpO2: 96%  96%      Constitutional: Asleep arousable but drifts back off to sleep. Filed Vitals:   07/19/15 1404 07/19/15 1410 07/19/15 1630  BP: 120/58  115/64  Pulse: 59  56  Temp: 98.2 F (36.8 C)  98.2 F (36.8 C)  TempSrc: Oral  Oral  Resp: 16  15  Height:  6' (1.829 m)   Weight:  86.183 kg (190 lb)   SpO2: 96%  96%   Eyes: PERRLA, lids and conjunctivae normal ENMT: Mucous membranes are extremely dry.  Posterior pharynx clear of any exudate or lesions.Normal dentition.  Neck: normal, supple, no masses, no thyromegaly Respiratory: clear to auscultation bilaterally anterior lung fields, no wheezing, no crackles. Normal respiratory effort. No accessory muscle use.  Cardiovascular: Regular rate and rhythm, no murmurs / rubs / gallops. No extremity edema. 2+ pedal pulses. No carotid bruits.  Abdomen: no tenderness, no masses palpated. No hepatosplenomegaly. Bowel sounds positive.  Musculoskeletal: Right lower extremity shortened and externally rotated. no clubbing / cyanosis.  Skin: no rashes, lesions, ulcers. No induration Neurologic: Unable to assess as patient drowsy. Psychiatric: Unable to assess as patient is drowsy.   Labs on Admission: I have personally reviewed following labs and imaging studies  CBC:  Recent Labs Lab 07/19/15 1541  WBC 9.0  NEUTROABS 7.0  HGB 8.3*  HCT 26.1*  MCV 85.3  PLT [id number]   Basic Metabolic Panel:  Recent Labs Lab 07/19/15 1541  NA 140  K 5.0  CL 110  CO2 21*  GLUCOSE 124*  BUN 42*  CREATININE 3.22*  CALCIUM 8.4*   GFR: Estimated Creatinine Clearance: 16.7 mL/min (by C-G formula based on Cr of 3.22). Liver Function Tests:  Recent Labs Lab 07/19/15 1541  AST 16  ALT 16*  ALKPHOS 71  BILITOT 0.6  PROT 6.6  ALBUMIN 3.1*   No results for input(s): LIPASE, AMYLASE in the last 168 hours. No results for input(s): AMMONIA in the last 168 hours. Coagulation Profile: No results for input(s): INR, PROTIME in the last 168 hours. Cardiac Enzymes: No results for input(s): CKTOTAL, CKMB, CKMBINDEX, TROPONINI in the last 168 hours. BNP (last 3 results) No results for input(s): PROBNP in the last 8760 hours. HbA1C: No results for input(s): HGBA1C in the last 72 hours. CBG: No results for input(s): GLUCAP in the last 168 hours. Lipid Profile: No results for input(s): CHOL, HDL, LDLCALC, TRIG, CHOLHDL, LDLDIRECT in the last 72 hours. Thyroid  Function Tests: No results for input(s): TSH, T4TOTAL, FREET4, T3FREE, THYROIDAB in the last 72 hours. Anemia Panel: No results for input(s): VITAMINB12, FOLATE, FERRITIN, TIBC, IRON, RETICCTPCT in the last 72 hours. Urine analysis:    Component Value Date/Time  COLORURINE YELLOW 03/22/2013 [place] 03/22/2013 1545   LABSPEC 1.014 03/22/2013 1545   PHURINE 5.5 03/22/2013 1545   GLUCOSEU 250* 03/22/2013 1545   HGBUR NEGATIVE 03/22/2013 1545   BILIRUBINUR NEGATIVE 03/22/2013 1545   KETONESUR NEGATIVE 03/22/2013 1545   PROTEINUR 100* 03/22/2013 1545   UROBILINOGEN 0.2 03/22/2013 1545   NITRITE NEGATIVE 03/22/2013 1545   LEUKOCYTESUR NEGATIVE 03/22/2013 1545   Sepsis Labs: @LABRCNTIP (procalcitonin:4,lacticidven:4) )No results found for this or any previous visit (from the past 240 hour(s)).   Radiological Exams on Admission: Dg Chest 1 View  07/19/2015  CLINICAL DATA:  Preoperative examination for patient with a right hip fracture. Initial encounter. EXAM: CHEST 1 VIEW COMPARISON:  PA and lateral chest 03/22/2013 and 06/20/2012. FINDINGS: The patient is slightly rotated on the study. Lung volumes are low with some crowding of the bronchovascular structures. No consolidative process, pneumothorax or effusion. Heart size is normal. IMPRESSION: No acute finding in a low volume chest. Electronically Signed   By: Inge Rise M.D.   On: 07/19/2015 15:59   Dg Hip Unilat With Pelvis 2-3 Views Right  07/19/2015  CLINICAL DATA:  Right hip shortening after fall. EXAM: DG HIP (WITH OR WITHOUT PELVIS) 2-3V RIGHT COMPARISON:  None. FINDINGS: Displaced right femoral neck fracture with proximal migration of the femoral shaft. Femoral head remains seated. Remaining bony pelvis including the pubic rami are intact. No additional acute fracture. IMPRESSION: Displaced right femoral neck fracture. Electronically Signed   By: Jeb Levering M.D.   On: 07/19/2015 16:04    EKG:  Independently reviewed. Prolonged PR interval. Right bundle branch block. Left anterior fascicular block. No change from prior EKG.  Assessment/Plan Principal Problem:   Fracture of femoral neck, right, closed Active Problems:   Carotid stenosis   Glaucoma   CVA (cerebral infarction)   HTN (hypertension)   DM (diabetes mellitus) (HCC)   GERD (gastroesophageal reflux disease)   Dementia with behavioral disturbance   Acute kidney injury superimposed on CKD (HCC)   Dehydration   Dementia   Anemia  #1 right femoral neck fracture Secondary to mechanical fall. Patient with history of renal cell cancer and prostate cancer, dementia on hospice at a memory care unit. Patient with history of carotid artery occlusion status post repair, history of prior TIAs, diabetes, depression/anxiety. Patient with no prior history of MIs and per family patient with no recent chest pain or shortness of breath. EKG unchanged from prior EKG with no ischemic changes. Due to patient's age and comorbidities patient is likely moderate risk for surgery with a estimated probability for preop MI of 3.43% per [facility] perioperative Cardiac risk. Orthopedics assessing for further evaluation and management. PT/OT.   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 Hold nephrotoxic agents. Check a fractional excretion of sodium. Check a UA with cultures and sensitivities. Place Foley catheter. IV fluids. Follow.  #3 dehydration IV fluids.  #4 glaucoma Continue eyedrops.  #5 anemia Patient with no overt bleeding. Check an anemia panel. Follow H&H.  #6 hypertension Continue home regimen Norvasc.  #7 history of prostate cancer/renal cell cancer Patient being followed by hospice.  #8 dementia Stable. Continue Depakote sprinkles and Ativan as needed.  #9 gastroesophageal reflux  disease Continue H2 blockers.    DVT prophylaxis: SCDs Code Status: DO NOT RESUSCITATE Family Communication: Updated son and daughter-in-law at bedside.  Disposition Plan: Pending orthopedic evaluation and rxcs. Consults called: Orthopedics: Dr Percell Miller Admission status: Inpatient/Med Surg   [facility] MD [facility] Pager [phone]  If 7PM-7AM, please contact night-coverage [email or web address] Password [facility]  07/19/2015, 6:53 PM

## 2015-07-19 NOTE — ED Provider Notes (Signed)
CSN: [id number]     Arrival date & time 07/19/15  1355 History   First MD Initiated Contact with Patient 07/19/15 1457     Chief Complaint  Patient presents with  . Fall    POSSIBLE RIGHT HIP FX    Patient is a 82 y.o. male presenting with fall. The history is provided by a relative. No language interpreter was used.  Fall   Ronald Hunt is a 82 y.o. male who presents to the Emergency Department complaining of hip fracture.  Level V caveat due to dementia.  Hx is provided by the patient's son.  He is currently on hospice at [facility] care unit for renal cell cancer and prostate cancer. His son reports that he falls and slips to the ground frequently, 3 times over the last few days. Yesterday he was walking and had a fall onto his side. No head injury at that time. He experienced immediate pain and was moved to his room. He was given a Vicodin by the hospice nurse. They had outpatient x-rays performed that demonstrated a right hip fracture and he was referred to the emergency department for additional management.  Past Medical History  Diagnosis Date  . Dizziness   . Macular degeneration   . History of TIAs   . Carotid artery occlusion   . Glaucoma   . Stroke [facility])     TIA  . Mitral valvular disorder   . Hypertension     takes Amlodipine and Benicar daily  . Heart murmur   . Hoarseness     states pretty much all the time  . Diabetes mellitus     takes Glimepiride daily  . GERD (gastroesophageal reflux disease)     doesn't require meds  . Constipation     occasionally Mag Citrate  . Urinary frequency   . Cancer ([place])     prostate;takes Flomax daily  . Nocturia   . Depression     but doesn't require meds  . Anxiety     doesn't take any meds  . Short-term memory loss   . Long-term memory loss   . Aortic insufficiency     mild-moderate AR by echo 07/2011 (Dr. Debara Pickett)  . Urinary tract infection, site not specified   . Orthostatic hypotension   . Dementia   . Other  symptoms involving cardiovascular system   . Unspecified transient cerebral ischemia   . Unspecified vitamin D deficiency   . Malignant neoplasm of prostate ([place])   . Unspecified glaucoma   . Senile cataract, unspecified   . Unspecified essential hypertension   . Other abnormal blood chemistry   . Proteinuria   . Complication of anesthesia     bowels, bladder slow to "awaken" after surgery  . Acute kidney failure, unspecified ([place])     3/14 no problems now   Past Surgical History  Procedure Laterality Date  . Retinal detachment surgery Bilateral 70's 80's  . Cataract extraction Bilateral   . Endarterectomy  09/12/2011    Procedure: ENDARTERECTOMY CAROTID;  Surgeon: Elam Dutch, MD;  Location: [facility] OR;  Service: Vascular;  Laterality: Right;  right carotid artery endarterectomy  with dacron patch angioplasty  . Carotid endarterectomy  09/12/11    RIGHT  cea  . Inguinal hernia repair Right 12/19/2012    Procedure: REPAIR RIGHT INGUINAL HERNIA ;  Surgeon: Earnstine Regal, MD;  Location: [place];  Service: General;  Laterality: Right;  . Insertion of mesh N/A 12/19/2012  Procedure: INSERTION OF MESH;  Surgeon: Earnstine Regal, MD;  Location: [place];  Service: General;  Laterality: N/A;  . Laceration repair Right 03/22/2013    Procedure: REPAIR MULTIPLE LACERATIONS;  Surgeon: Tennis Must, MD;  Location: [place];  Service: Orthopedics;  Laterality: Right;   Family History  Problem Relation Age of Onset  . Cancer Brother   . Crohn's disease Son    Social History  Substance Use Topics  . Smoking status: Never Smoker   . Smokeless tobacco: Never Used  . Alcohol Use: No     Comment: occasionally beer 2-3 times/month    Review of Systems  Unable to perform ROS: Dementia  Endocrine: Positive for heat intolerance.      Allergies  Review of patient's allergies indicates no known allergies.  Home Medications   Prior to Admission medications   Medication Sig Start Date End Date  Taking? Authorizing Provider  acetaminophen (TYLENOL) 325 MG tablet Take 650 mg by mouth 4 (four) times daily. May take an additional 325mg  every 12 hours as needed for pain/fever   Yes Historical Provider, MD  amLODipine (NORVASC) 10 MG tablet Take 1 tablet (10 mg total) by mouth daily. 09/08/12  Yes Lauree Chandler, NP  bimatoprost (LUMIGAN) 0.01 % SOLN Place 1 drop into both eyes at bedtime.   Yes Historical Provider, MD  divalproex (DEPAKOTE SPRINKLE) 125 MG capsule Take 125 mg by mouth every 12 (twelve) hours.   Yes Historical Provider, MD  docusate sodium (COLACE) 100 MG capsule Take 100 mg by mouth daily.   Yes Historical Provider, MD  haloperidol (HALDOL) 2 MG tablet Take 2 mg by mouth every 4 (four) hours as needed for agitation (anxiety or restlessness).   Yes Historical Provider, MD  HYDROcodone-acetaminophen (NORCO) 5-325 MG per tablet Take 1 tablet by mouth every 6 (six) hours as needed for moderate pain. 1-2 tabs po q6 hours prn pain Patient taking differently: Take 1 tablet by mouth every 4 (four) hours as needed for moderate pain.  03/22/13  Yes Leanora Cover, MD  LORazepam (ATIVAN) 0.5 MG tablet Take 0.5 mg by mouth daily as needed (agitation).  10/07/12  Yes Historical Provider, MD  losartan (COZAAR) 100 MG tablet Take 100 mg by mouth daily.  12/04/14  Yes Historical Provider, MD  nystatin (NYSTATIN) powder Apply topically. Apply topically to abdominal folds twice daily as needed for rash   Yes Historical Provider, MD  nystatin cream (MYCOSTATIN) Apply 1 application topically 2 (two) times daily. Apply to groin and gluteals   Yes Historical Provider, MD  ranitidine (ZANTAC) 75 MG tablet Take 75 mg by mouth 2 (two) times daily.   Yes Historical Provider, MD  Silicone-Lanolin-Zinc Oxide (SELAN + ZINC OXIDE EX) Apply topically. Apply topically to buttocks with each brief change and with bath   Yes Historical Provider, MD  sulfamethoxazole-trimethoprim (BACTRIM DS,SEPTRA DS) 800-160 MG  tablet Take 1 tablet by mouth 2 (two) times daily.   Yes Historical Provider, MD  Tamsulosin HCl (FLOMAX) 0.4 MG CAPS Take 0.4 mg by mouth daily.  12/23/10  Yes Historical Provider, MD  timolol (TIMOPTIC) 0.5 % ophthalmic solution Place 1 drop into both eyes 2 (two) times daily. 09/30/12  Yes Lauree Chandler, NP   BP 129/66 mmHg  Pulse 62  Temp(Src) 98.3 F (36.8 C) (Oral)  Resp 16  Ht 6\' 1"  (1.854 m)  Wt 190 lb (86.183 kg)  BMI 25.07 kg/m2  SpO2 94% Physical Exam  Constitutional: He  appears well-developed and well-nourished.  HENT:  Head: Normocephalic and atraumatic.  Cardiovascular: Normal rate and regular rhythm.   No murmur heard. Pulmonary/Chest: Effort normal and breath sounds normal. No respiratory distress.  Abdominal: Soft. There is no tenderness. There is no rebound and no guarding.  Musculoskeletal:  2+ DP pulses in BLE.  RLE is externally rotated and shortened.   Neurological:  Drowsy, awakens to verbal stimuli.  Disoriented to place and time  Skin: Skin is warm and dry.  Psychiatric: He has a normal mood and affect. His behavior is normal.  Nursing note and vitals reviewed.   ED Course  Procedures (including critical care time) Labs Review Labs Reviewed  COMPREHENSIVE METABOLIC PANEL - Abnormal; Notable for the following:    CO2 21 (*)    Glucose, Bld 124 (*)    BUN 42 (*)    Creatinine, Ser 3.22 (*)    Calcium 8.4 (*)    Albumin 3.1 (*)    ALT 16 (*)    GFR calc non Af Amer 16 (*)    GFR calc Af Amer 18 (*)    All other components within normal limits  CBC WITH DIFFERENTIAL/PLATELET - Abnormal; Notable for the following:    RBC 3.06 (*)    Hemoglobin 8.3 (*)    HCT 26.1 (*)    RDW 16.6 (*)    All other components within normal limits  RETICULOCYTES - Abnormal; Notable for the following:    RBC. 2.99 (*)    All other components within normal limits  URINE CULTURE  MAGNESIUM  URINALYSIS, ROUTINE W REFLEX MICROSCOPIC (NOT AT [facility])  VITAMIN B12   FOLATE  IRON AND TIBC  FERRITIN  SODIUM, URINE, RANDOM  CREATININE, URINE, RANDOM  URINALYSIS, ROUTINE W REFLEX MICROSCOPIC (NOT AT [facility])  BASIC METABOLIC PANEL  CBC  PROTIME-INR  VITAMIN D 25 HYDROXY (VIT D DEFICIENCY, FRACTURES)  TYPE AND SCREEN  ABO/RH    Imaging Review Dg Chest 1 View  07/19/2015  CLINICAL DATA:  Preoperative examination for patient with a right hip fracture. Initial encounter. EXAM: CHEST 1 VIEW COMPARISON:  PA and lateral chest 03/22/2013 and 06/20/2012. FINDINGS: The patient is slightly rotated on the study. Lung volumes are low with some crowding of the bronchovascular structures. No consolidative process, pneumothorax or effusion. Heart size is normal. IMPRESSION: No acute finding in a low volume chest. Electronically Signed   By: Inge Rise M.D.   On: 07/19/2015 15:59   Dg Hip Unilat With Pelvis 2-3 Views Right  07/19/2015  CLINICAL DATA:  Right hip shortening after fall. EXAM: DG HIP (WITH OR WITHOUT PELVIS) 2-3V RIGHT COMPARISON:  None. FINDINGS: Displaced right femoral neck fracture with proximal migration of the femoral shaft. Femoral head remains seated. Remaining bony pelvis including the pubic rami are intact. No additional acute fracture. IMPRESSION: Displaced right femoral neck fracture. Electronically Signed   By: Jeb Levering M.D.   On: 07/19/2015 16:04   I have personally reviewed and evaluated these images and lab results as part of my medical decision-making.   EKG Interpretation   Date/Time:  Tuesday July 19 2015 14:08:42 EDT Ventricular Rate:  57 PR Interval:  231 QRS Duration: 149 QT Interval:  451 QTC Calculation: 439 R Axis:   -49 Text Interpretation:  Sinus rhythm Prolonged PR interval RBBB and LAFB  Confirmed by Hazle Coca [phone]) on 07/19/2015 3:30:55 PM      MDM   Final diagnoses:  Femoral neck fracture, right, closed, initial  encounter    Patient here for evaluation of injuries following a fall. He is currently  on hospice for renal cancer and prostate cancer. Imaging demonstrates a right femoral neck fracture. Discussed with Dr. Percell Miller with orthopedics who evaluated the patient in the emergency department. Discussed with hospitalist regarding admission for further management.     Quintella Reichert, MD 07/20/15 [phone]

## 2015-07-19 NOTE — ED Notes (Addendum)
PT RECEIVED FROM [facility] C/O RIGHT HIP PAIN. PT HAS FALLEN ON Friday, Saturday, AND Sunday. PT HAD AN X-RAY TODAY, WHICH SHOWED A FX. PT HAS H/O DEMENTIA. PT RECEIVED VICODIN X2 DOSES, LAST WAS AT 49 AM. PT POSITIVE FOR RIGHT LEG SHORTENING AND OUTWARD ROTATION.

## 2015-07-19 NOTE — ED Notes (Signed)
RN to get labs with IV start.

## 2015-07-19 NOTE — ED Notes (Signed)
Pt can go up at 19:15

## 2015-07-19 NOTE — ED Notes (Addendum)
Patient will need to be cathed, patient altered and incontnent.

## 2015-07-19 NOTE — Progress Notes (Signed)
CSW attempted to speak with patient at bedside. However, physician is present speaking with pt and family. CSW will attempt to see pt again later.  Willette Brace [id number] ED CSW 07/19/2015 7:16 PM

## 2015-07-20 DIAGNOSIS — S72001A Fracture of unspecified part of neck of right femur, initial encounter for closed fracture: Principal | ICD-10-CM

## 2015-07-20 DIAGNOSIS — F0391 Unspecified dementia with behavioral disturbance: Secondary | ICD-10-CM

## 2015-07-20 DIAGNOSIS — W19XXXA Unspecified fall, initial encounter: Secondary | ICD-10-CM

## 2015-07-20 LAB — URINALYSIS, ROUTINE W REFLEX MICROSCOPIC
Bilirubin Urine: NEGATIVE
Glucose, UA: NEGATIVE mg/dL
Ketones, ur: NEGATIVE mg/dL
NITRITE: NEGATIVE
Protein, ur: 100 mg/dL — AB
SPECIFIC GRAVITY, URINE: 1.016 (ref 1.005–1.030)
pH: 6 (ref 5.0–8.0)

## 2015-07-20 LAB — IRON AND TIBC
Iron: 25 ug/dL — ABNORMAL LOW (ref 45–182)
SATURATION RATIOS: 9 % — AB (ref 17.9–39.5)
TIBC: 270 ug/dL (ref 250–450)
UIBC: 245 ug/dL

## 2015-07-20 LAB — BASIC METABOLIC PANEL
Anion gap: 9 (ref 5–15)
BUN: 45 mg/dL — AB (ref 6–20)
CALCIUM: 8.2 mg/dL — AB (ref 8.9–10.3)
CO2: 20 mmol/L — ABNORMAL LOW (ref 22–32)
CREATININE: 3.32 mg/dL — AB (ref 0.61–1.24)
Chloride: 110 mmol/L (ref 101–111)
GFR calc Af Amer: 17 mL/min — ABNORMAL LOW (ref >60)
GFR, EST NON AFRICAN AMERICAN: 15 mL/min — AB (ref >60)
GLUCOSE: 136 mg/dL — AB (ref 65–99)
Potassium: 5.1 mmol/L (ref 3.5–5.1)
SODIUM: 139 mmol/L (ref 135–145)

## 2015-07-20 LAB — URINE MICROSCOPIC-ADD ON

## 2015-07-20 LAB — ABO/RH: ABO/RH(D): O POS

## 2015-07-20 LAB — CBC
HEMATOCRIT: 25.5 % — AB (ref 39.0–52.0)
Hemoglobin: 8.1 g/dL — ABNORMAL LOW (ref 13.0–17.0)
MCH: 27.8 pg (ref 26.0–34.0)
MCHC: 31.8 g/dL (ref 30.0–36.0)
MCV: 87.6 fL (ref 78.0–100.0)
PLATELETS: 181 10*3/uL (ref 150–400)
RBC: 2.91 MIL/uL — ABNORMAL LOW (ref 4.22–5.81)
RDW: 16.8 % — AB (ref 11.5–15.5)
WBC: 8.6 10*3/uL (ref 4.0–10.5)

## 2015-07-20 LAB — FERRITIN: FERRITIN: 108 ng/mL (ref 24–336)

## 2015-07-20 LAB — SODIUM, URINE, RANDOM: SODIUM UR: 58 mmol/L

## 2015-07-20 LAB — PROTIME-INR
INR: 1.24 (ref 0.00–1.49)
PROTHROMBIN TIME: 15.7 s — AB (ref 11.6–15.2)

## 2015-07-20 LAB — MRSA PCR SCREENING: MRSA BY PCR: POSITIVE — AB

## 2015-07-20 LAB — FOLATE: FOLATE: 11.4 ng/mL (ref >5.9)

## 2015-07-20 LAB — VITAMIN B12: Vitamin B-12: 383 pg/mL (ref 180–914)

## 2015-07-20 LAB — CREATININE, URINE, RANDOM: CREATININE, URINE: 65.31 mg/dL

## 2015-07-20 MED ORDER — SODIUM CHLORIDE 0.9 % IV SOLN
INTRAVENOUS | Status: DC
  Administered 2015-07-20 (×2): via INTRAVENOUS

## 2015-07-20 MED ORDER — CHLORHEXIDINE GLUCONATE CLOTH 2 % EX PADS
6.0000 | MEDICATED_PAD | Freq: Every day | CUTANEOUS | Status: DC
  Administered 2015-07-21: 6 via TOPICAL

## 2015-07-20 MED ORDER — HALOPERIDOL LACTATE 5 MG/ML IJ SOLN
2.0000 mg | Freq: Once | INTRAMUSCULAR | Status: AC
  Administered 2015-07-20: 2 mg via INTRAVENOUS

## 2015-07-20 MED ORDER — MUPIROCIN 2 % EX OINT
1.0000 "application " | TOPICAL_OINTMENT | Freq: Two times a day (BID) | CUTANEOUS | Status: DC
  Administered 2015-07-20 – 2015-07-21 (×3): 1 via NASAL
  Filled 2015-07-20: qty 22

## 2015-07-20 MED ORDER — HALOPERIDOL LACTATE 5 MG/ML IJ SOLN
INTRAMUSCULAR | Status: AC
  Filled 2015-07-20: qty 1

## 2015-07-20 MED ORDER — HALOPERIDOL LACTATE 2 MG/ML PO CONC
2.0000 mg | Freq: Once | ORAL | Status: DC
  Filled 2015-07-20: qty 1

## 2015-07-20 NOTE — Progress Notes (Signed)
Pt having difficulty swallowing pills and not able to eat breakfast or lunch today. Pt holding food in mouth and not swallowing. MD is aware. No questions or concerns at this time.  Mickel Schreur W Latona Krichbaum, RN

## 2015-07-20 NOTE — Progress Notes (Signed)
[facility] GIP RN Visit  This is a related, covered admission from 07/19/15 to [facility] diagnosis of Renal CA, per Dr. Shelbie Proctor.  Patient has an [facility] DNR.   Patient fell out of his W/C on the evening of 07/18/15, when coming back from dinner.  An order for for Vicodin was written and a X-Ray revealed a right hip fracture. After a delay of the medication getting to the facility and the patient's ongoing c/o severe pain, it was decided that the patient be evaluated in the ED.  Patient seen in room, resting in bed.  Patient lethargic and not responding to verbal stimuli.  Patient is on RA with normal respirations and O2 sats at 95%.  Per chart review, patient received 2 doses of 2 mg Morphine IV the past 24 hours for severe pain.  RN stated the patient was too lethargic to eat breakfast or lunch and was experiencing difficulty swallowing his pills. Patient is currently receiving IVF via a PIV at 75 ml/hr. Patient has a urinary catheter present with minimal red, cloudy drainage noted. No family present at time of visit.  Placed call to son, [name] who stated the family has opted to not have surgery at this time.  Family confirmed they do want him to go back to [facility] with the support of hospice.  Discussed the need for additional DME, and per family request an order placed with [facility] for bed extension and high back W/C.  Left contact information if any additional needs or concerns arise.  Updated [facility] medication list and transfer summary placed on chart.  Please call with any hospice-related questions or concerns.  Thank You, Freddi Starr RN, [facility] Liaison [phone]

## 2015-07-20 NOTE — Progress Notes (Signed)
Nutrition Brief Note  Will assess patient following goals of care discussion if appropriate. Given patient's hospice status, mentation, and not being a candidate for nutrition support, it is not appropriate that RD assess patient.  Ronald Hunt. Craige Patel, MS, RD LDN After Hours/Weekend Pager [phone]

## 2015-07-20 NOTE — Progress Notes (Signed)
I spoke with his family again today and they have elected for non-operative management.  Recommend Hospice c/s, f/u with me in the office in 4wks as needed.   Kimiyo Carmicheal D

## 2015-07-20 NOTE — Clinical Social Work Note (Signed)
Clinical Social Work Assessment  Patient Details  Name: Ronald Hunt MRN: [id number] Date of Birth: 1923/04/24  Date of referral:  07/20/15               Reason for consult:  Facility Placement, Discharge Planning                Permission sought to share information with:  Facility Art therapist granted to share information::  Yes, Verbal Permission Granted  Name::        Agency::     Relationship::     Contact Information:     Housing/Transportation Living arrangements for the past 2 months:   (Memory care unit) Source of Information:  Adult Children Patient Interpreter Needed:  None Criminal Activity/Legal Involvement Pertinent to Current Situation/Hospitalization:  No - Comment as needed Significant Relationships:  Adult Children, Other Family Members Lives with:  Facility Resident Do you feel safe going back to the place where you live?  Yes Need for family participation in patient care:  Yes (Comment)  Care giving concerns: No concerns at this time.  Social Worker assessment / plan:  Pt hospitalized on 07/19/15 with a right hip fx. Pt is from [facility] ( memory care unit ). CSW met with pt's daughter in law at bedside. Pt was sleeping soundly during visit. CSW also spoke with pt's son, [name] , [phone], to assist with d/c planning. Pt has dementia and is unable to participate in d/c planning at this time. Family reports that hip fx will be treated non surgically. Family is hopeful that pt can return to the memory care unit at d/c. Family reports that pt was ambulating with a walker prior to fall. Staff assisted pt with ADL's. CSW has sent clinicals to  memory care unit for review. Family reports that [facility] has been following pt  at facility and they would like this continued at d/c. CSW will continue to follow to assist with d/c planning needs.  Employment status:  Retired Forensic scientist:   Information systems manager, Other (Comment Required) (Hospice) PT Recommendations:  Not assessed at this time Information / Referral to community resources:     Patient/Family's Response to care:  Family would like pt to return to memory care unit at d/c with hospice.  Patient/Family's Understanding of and Emotional Response to Diagnosis, Current Treatment, and Prognosis:  Family spoke with MD this am and have been updated on pt's medical status. Family reports pt is not at his baseline. " I think the pain medication is making him sleep. He was in so much pain. I think he is more comfortable now. ". Family reports, that prior to hospitalization pt was able to carry on a conversation, though he would have trouble with word finding. Family reports that pt had several falls recently / slipping out of his chair, which they feel may have been related to the  UTI he had. " MD said surgery could be revisited at a later date, if it seems appropriate. " Support and reassurance provided.  Emotional Assessment Appearance:  Appears stated age Attitude/Demeanor/Rapport:  Unable to Assess Affect (typically observed):  Unable to Assess Orientation:   (Nsg notes disoriented x 4) Alcohol / Substance use:  Not Applicable Psych involvement (Current and /or in the community):  No (Comment)  Discharge Needs  Concerns to be addressed:  Discharge Planning Concerns Readmission within the last 30 days:  No Current discharge risk:  None Barriers  to Discharge:  No Barriers Identified   Loraine Maple  [phone] 07/20/2015, 12:09 PM

## 2015-07-20 NOTE — Care Management Note (Signed)
Case Management Note  Patient Details  Name: Ronald Hunt MRN: [id number] Date of Birth: 12-30-1923  Subjective/Objective:                  right hip fracture Action/Plan: Discharge planning Expected Discharge Date:   (unknown)               Expected Discharge Plan:  Skilled Nursing Facility/Memory Care Unit  In-House Referral:     Discharge planning Services  CM Consult  Post Acute Care Choice:    Choice offered to:     DME Arranged:    DME Agency:     HH Arranged:    [place] Agency:     Status of Service:  Completed, signed off  Medicare Important Message Given:    Date Medicare IM Given:    Medicare IM give by:    Date Additional Medicare IM Given:    Additional Medicare Important Message give by:     If discussed at [place] of Stay Meetings, dates discussed:    Additional Comments: CM notes CSW arranging for pt to go to [facility] unit.  NO other CM needs were communicated. Dellie Catholic, RN 07/20/2015, 1:03 PM

## 2015-07-20 NOTE — Progress Notes (Signed)
PROGRESS NOTE    Ronald Hunt  [id number] DOB: January 19, 1924 DOA: 07/19/2015 PCP: Reymundo Poll, MD  Outpatient Specialists:  Brief Narrative: Ronald Hunt is a 82 year old gentleman with multiple comorbidities including advanced dementia, history of TIAs, renal cell carcinoma, diabetes mellitus, admitted to the medicine service on 07/19/2015. He had been residing at [facility] where hospice had been involved in his care. He had a fall outside of his room, unable to bear weight. Imaging studies performed in the emergency department revealed a right femoral neck fracture. Orthopedic surgery was consulted as patient was evaluated by Dr. Percell Miller. Having advanced dementia with poor functional status orthopedic surgery had a long discussion regarding the risks and benefits of undergoing orthopedic repair.                                                                        Assessment & Plan:   Principal Problem:   Fracture of femoral neck, right, closed Active Problems:   Carotid stenosis   Glaucoma   CVA (cerebral infarction)   HTN (hypertension)   DM (diabetes mellitus) (HCC)   GERD (gastroesophageal reflux disease)   Dementia with behavioral disturbance   Acute kidney injury superimposed on CKD (HCC)   Dehydration   Dementia   Anemia   1.  Right femoral neck fracture.  -Ronald Hunt having a fall at his facility. At baseline he has advanced dementia for which hospice had been following. He also has a poor functional status. Imaging studies showing right femoral neck fracture. -Orthopedic surgery was consulted patient seen and evaluated by Dr. Percell Miller. He held family discussion regarding risks and benefits of surgery for Ronald Hunt.  -I spoke with his son and daughter on 07/20/2015 as they expressed wishes for nonoperative management -Continue supportive care  2.  Acute on chronic kidney disease versus progression of chronic kidney disease -Patient having a history of stage III chronic  kidney disease with baseline creatinine near  2014 1.2 - 1.4. On presenting lab work he had a creatinine of 3.2 with BUN of 42. -Dehydration may have lead to fall. -Plan to continue running gentle IV fluid hydration with normal saline at 75 mL/hour  3.  Advanced dementia. -At baseline Ronald. Hunt has a poor functional status, requires assistance with all activities of daily living -Hospice services following -Family members interested in speaking with hospice during this hospitalization to go over medical's of care  4.  History of renal cell cancer/prostate cancer -His overall prognosis is poor with hospice services involved in his care. Family members wishing to discuss goals of care from this point forward, hospice services consulted.   DVT prophylaxis: SCDs Code Status: DO NOT RESUSCITATE Family Communication: I spoke with his daughter and son over telephone conversation Disposition Plan: Plan for discharge in the next 24 hours, family members electing nonoperative management of hip fracture.  Consultants:   Orthopedic surgery   Subjective: Patient is sedated, minimally verbal  Objective: Filed Vitals:   07/19/15 2149 07/19/15 2300 07/20/15 0009 07/20/15 0300  BP: 130/68 129/66 121/60 137/58  Pulse:  62 60 69  Temp: 98.2 F (36.8 C) 98.3 F (36.8 C) 98.1 F (36.7 C) 99.1 F (37.3 C)  TempSrc: Oral Oral Oral Axillary  Resp: 18 16 16 16   Height:      Weight:      SpO2: 96% 94% 90% 93%    Intake/Output Summary (Last 24 hours) at 07/20/15 0905 Last data filed at 07/20/15 0853  Gross per 24 hour  Intake      0 ml  Output    360 ml  Net   -360 ml   Filed Weights   07/19/15 1410  Weight: 86.183 kg (190 lb)    Examination:  General exam: Chronically ill-appearing, minimally arousable Respiratory system: Clear to auscultation. Respiratory effort normal. Cardiovascular system: S1 & S2 heard, RRR. No JVD, murmurs, rubs, gallops or clicks. No pedal  edema. Gastrointestinal system: Abdomen is nondistended, soft and nontender. No organomegaly or masses felt. Normal bowel sounds heard. Central nervous system: Difficult to arouse, seems to fall asleep quickly, has history of advanced dementia Extremities: Symmetric 5 x 5 power. Skin: No rashes, lesions or ulcers Psychiatry: History of advanced dementia, mumbling a few words, unable to follow commands.    Data Reviewed: I have personally reviewed following labs and imaging studies  CBC:  Recent Labs Lab 07/19/15 1541 07/20/15 0411  WBC 9.0 8.6  NEUTROABS 7.0  --   HGB 8.3* 8.1*  HCT 26.1* 25.5*  MCV 85.3 87.6  PLT 159 0000000   Basic Metabolic Panel:  Recent Labs Lab 07/19/15 1541 07/19/15 2045 07/20/15 0411  NA 140  --  139  K 5.0  --  5.1  CL 110  --  110  CO2 21*  --  20*  GLUCOSE 124*  --  136*  BUN 42*  --  45*  CREATININE 3.22*  --  3.32*  CALCIUM 8.4*  --  8.2*  MG  --  2.3  --    GFR: Estimated Creatinine Clearance: 16.7 mL/min (by C-G formula based on Cr of 3.32). Liver Function Tests:  Recent Labs Lab 07/19/15 1541  AST 16  ALT 16*  ALKPHOS 71  BILITOT 0.6  PROT 6.6  ALBUMIN 3.1*   No results for input(s): LIPASE, AMYLASE in the last 168 hours. No results for input(s): AMMONIA in the last 168 hours. Coagulation Profile:  Recent Labs Lab 07/20/15 0411  INR 1.24   Cardiac Enzymes: No results for input(s): CKTOTAL, CKMB, CKMBINDEX, TROPONINI in the last 168 hours. BNP (last 3 results) No results for input(s): PROBNP in the last 8760 hours. HbA1C: No results for input(s): HGBA1C in the last 72 hours. CBG: No results for input(s): GLUCAP in the last 168 hours. Lipid Profile: No results for input(s): CHOL, HDL, LDLCALC, TRIG, CHOLHDL, LDLDIRECT in the last 72 hours. Thyroid Function Tests: No results for input(s): TSH, T4TOTAL, FREET4, T3FREE, THYROIDAB in the last 72 hours. Anemia Panel:  Recent Labs  07/19/15 1934  VITAMINB12 383   FOLATE 11.4  FERRITIN 108  TIBC 270  IRON 25*  RETICCTPCT 1.4   Urine analysis:    Component Value Date/Time   COLORURINE AMBER* 07/20/2015 0337   APPEARANCEUR CLOUDY* 07/20/2015 0337   LABSPEC 1.016 07/20/2015 0337   PHURINE 6.0 07/20/2015 0337   GLUCOSEU NEGATIVE 07/20/2015 0337   HGBUR LARGE* 07/20/2015 0337   BILIRUBINUR NEGATIVE 07/20/2015 0337   KETONESUR NEGATIVE 07/20/2015 0337   PROTEINUR 100* 07/20/2015 0337   UROBILINOGEN 0.2 03/22/2013 1545   NITRITE NEGATIVE 07/20/2015 0337   LEUKOCYTESUR MODERATE* 07/20/2015 0337   Sepsis Labs: @LABRCNTIP (procalcitonin:4,lacticidven:4)  ) Recent Results (from the past 240 hour(s))  MRSA PCR Screening  Status: Abnormal   Collection Time: 07/20/15  2:34 AM  Result Value Ref Range Status   MRSA by PCR POSITIVE (A) NEGATIVE Final    Comment:        The GeneXpert MRSA Assay (FDA approved for NASAL specimens only), is one component of a comprehensive MRSA colonization surveillance program. It is not intended to diagnose MRSA infection nor to guide or monitor treatment for MRSA infections. RESULT CALLED TO, READ BACK BY AND VERIFIED WITH: A. [facility] RN AT 0719 ON 04.26.17 BY [facility]          Radiology Studies: Dg Chest 1 View  07/19/2015  CLINICAL DATA:  Preoperative examination for patient with a right hip fracture. Initial encounter. EXAM: CHEST 1 VIEW COMPARISON:  PA and lateral chest 03/22/2013 and 06/20/2012. FINDINGS: The patient is slightly rotated on the study. Lung volumes are low with some crowding of the bronchovascular structures. No consolidative process, pneumothorax or effusion. Heart size is normal. IMPRESSION: No acute finding in a low volume chest. Electronically Signed   By: Inge Rise M.D.   On: 07/19/2015 15:59   Dg Hip Unilat With Pelvis 2-3 Views Right  07/19/2015  CLINICAL DATA:  Right hip shortening after fall. EXAM: DG HIP (WITH OR WITHOUT PELVIS) 2-3V RIGHT COMPARISON:  None. FINDINGS:  Displaced right femoral neck fracture with proximal migration of the femoral shaft. Femoral head remains seated. Remaining bony pelvis including the pubic rami are intact. No additional acute fracture. IMPRESSION: Displaced right femoral neck fracture. Electronically Signed   By: Jeb Levering M.D.   On: 07/19/2015 16:04        Scheduled Meds: . amLODipine  10 mg Oral Daily  . divalproex  125 mg Oral Q12H  . docusate sodium  100 mg Oral Daily  . famotidine  20 mg Oral BID  . latanoprost  1 drop Both Eyes QHS  . nystatin   Topical BID  . nystatin cream  1 application Topical BID  . senna  1 tablet Oral BID  . tamsulosin  0.4 mg Oral Daily  . timolol  1 drop Both Eyes BID   Continuous Infusions: . sodium chloride    . sodium chloride 0.9 % 1,000 mL infusion       LOS: 1 day    Time spent: 35 minutes    Kelvin Cellar, MD [facility] Pager [phone]  If 7PM-7AM, please contact night-coverage [email or web address] Password [email or web address] 07/20/2015, 9:05 AM

## 2015-07-21 ENCOUNTER — Encounter (HOSPITAL_COMMUNITY)
Admission: EM | Disposition: A | Discharge status: Skilled Nursing Facility | Payer: Self-pay | Source: Home / Self Care | Attending: Internal Medicine

## 2015-07-21 DIAGNOSIS — N179 Acute kidney failure, unspecified: Secondary | ICD-10-CM

## 2015-07-21 DIAGNOSIS — N189 Chronic kidney disease, unspecified: Secondary | ICD-10-CM

## 2015-07-21 LAB — BASIC METABOLIC PANEL
ANION GAP: 10 (ref 5–15)
BUN: 41 mg/dL — AB (ref 6–20)
CALCIUM: 8.3 mg/dL — AB (ref 8.9–10.3)
CO2: 19 mmol/L — AB (ref 22–32)
CREATININE: 3.12 mg/dL — AB (ref 0.61–1.24)
Chloride: 110 mmol/L (ref 101–111)
GFR calc Af Amer: 19 mL/min — ABNORMAL LOW (ref >60)
GFR, EST NON AFRICAN AMERICAN: 16 mL/min — AB (ref >60)
GLUCOSE: 139 mg/dL — AB (ref 65–99)
Potassium: 4.6 mmol/L (ref 3.5–5.1)
Sodium: 139 mmol/L (ref 135–145)

## 2015-07-21 LAB — VITAMIN D 25 HYDROXY (VIT D DEFICIENCY, FRACTURES): VIT D 25 HYDROXY: 17.6 ng/mL — AB (ref 30.0–100.0)

## 2015-07-21 LAB — URINE CULTURE: Culture: 1000 — AB

## 2015-07-21 SURGERY — HEMIARTHROPLASTY, HIP, DIRECT ANTERIOR APPROACH, FOR FRACTURE
Anesthesia: Choice | Laterality: Right

## 2015-07-21 MED ORDER — HYDROCODONE-ACETAMINOPHEN 5-325 MG PO TABS: 1.0000 | ORAL_TABLET | Freq: Four times a day (QID) | ORAL | Status: AC | PRN

## 2015-07-21 MED ORDER — HYDROCODONE-ACETAMINOPHEN 5-325 MG PO TABS: 1.0000 | ORAL_TABLET | Freq: Four times a day (QID) | ORAL | Status: DC | PRN

## 2015-07-21 MED ORDER — LORAZEPAM 0.5 MG PO TABS: 0.5000 mg | ORAL_TABLET | Freq: Three times a day (TID) | ORAL | Status: AC | PRN

## 2015-07-21 NOTE — Progress Notes (Signed)
RN called report to Bella Kennedy at [facility] memory care unit. All question answered.   Paperwork and prescriptions sent with patient.   Patient transported to SNF facility with [facility].

## 2015-07-21 NOTE — NC FL2 (Signed)
[place] LEVEL OF CARE SCREENING TOOL     IDENTIFICATION  Patient Name: Ronald Hunt Birthdate: [birth year] Sex: male Admission Date (Current Location): 07/19/2015  [facility] and [place] Number:  Herbalist and Address:  [facility],  [place] [address] [place]      Provider Number: [id number]  Attending Physician Name and Address:  Kelvin Cellar, MD  Relative Name and Phone Number:       Current Level of Care: Hospital ([place] [facility]) Recommended Level of Care: Memory Care Prior Approval Number:    Date Approved/Denied:   PASRR Number:    Discharge Plan: Other (Comment) (memory care unit)    Current Diagnoses: Patient Active Problem List   Diagnosis Date Noted  . Fracture of femoral neck, right, closed 07/19/2015  . Acute kidney injury superimposed on CKD ([place]) 07/19/2015  . Dehydration 07/19/2015  . Dementia 07/19/2015  . Anemia 07/19/2015  . Aftercare following surgery of the circulatory system 12/24/2013  . Fall 03/23/2013  . Finger laceration 03/23/2013  . Inguinal hernia unilateral, non-recurrent, right 12/10/2012  . Aftercare following surgery of the circulatory system, [place] 11/28/2012  . Dementia with behavioral disturbance 06/25/2012  . TIA (transient ischemic attack) 10/21/2011  . Glaucoma 10/20/2011  . CVA (cerebral infarction) 10/20/2011  . HTN (hypertension) 10/20/2011  . DM (diabetes mellitus) ([place]) 10/20/2011  . GERD (gastroesophageal reflux disease) 10/20/2011  . Slurred speech 10/20/2011  . Acute renal failure ([place]) 10/20/2011  . Hypokalemia 10/20/2011  . Occlusion and stenosis of carotid artery without mention of cerebral infarction 08/09/2011  . Carotid stenosis 01/25/2011    Orientation RESPIRATION BLADDER Height & Weight      (disoriented x4)  Normal Indwelling catheter Weight: 83.915 kg (185 lb) Height:  6\' 1"  (185.4 cm)  BEHAVIORAL SYMPTOMS/MOOD NEUROLOGICAL BOWEL NUTRITION STATUS       Incontinent  (As Tolerated )  AMBULATORY STATUS COMMUNICATION OF NEEDS Skin   Total Care Verbally Normal                       Personal Care Assistance Level of Assistance  Bathing, Feeding, Dressing, Total care Bathing Assistance: Maximum assistance Feeding assistance: Maximum assistance Dressing Assistance: Maximum assistance Total Care Assistance: Maximum assistance   Functional Limitations Info  Sight, Hearing, Speech Sight Info: Adequate Hearing Info: Adequate Speech Info: Adequate    SPECIAL CARE FACTORS FREQUENCY                       Contractures Contractures Info: Not present    Additional Factors Info  Code Status Code Status Info: DNR             Current Medications (07/21/2015):  This is the current hospital active medication list Current Facility-Administered Medications  Medication Dose Route Frequency Provider Last Rate Last Dose  . 0.9 %  sodium chloride infusion   Intravenous Continuous Kelvin Cellar, MD 75 mL/hr at 07/20/15 2151    . acetaminophen (TYLENOL) tablet 650 mg  650 mg Oral Q6H PRN Eugenie Filler, MD       Or  . acetaminophen (TYLENOL) suppository 650 mg  650 mg Rectal Q6H PRN Eugenie Filler, MD      . amLODipine (NORVASC) tablet 10 mg  10 mg Oral Daily Eugenie Filler, MD   10 mg at 07/20/15 0913  . Chlorhexidine Gluconate Cloth 2 % PADS 6 each  6 each Topical Q0600 Ezequiel  Coralyn Pear, MD      . divalproex (DEPAKOTE SPRINKLE) capsule 125 mg  125 mg Oral Q12H Eugenie Filler, MD   125 mg at 07/20/15 2152  . docusate sodium (COLACE) capsule 100 mg  100 mg Oral Daily Eugenie Filler, MD   100 mg at 07/20/15 0914  . famotidine (PEPCID) tablet 20 mg  20 mg Oral BID Eugenie Filler, MD   20 mg at 07/20/15 2152  . haloperidol (HALDOL) tablet 2 mg  2 mg Oral Q4H PRN Eugenie Filler, MD   2 mg at 07/21/15 0045  . HYDROcodone-acetaminophen (NORCO/VICODIN) 5-325 MG per tablet 1 tablet  1 tablet Oral Q4H PRN Eugenie Filler,  MD   1 tablet at 07/20/15 2152  . HYDROcodone-acetaminophen (NORCO/VICODIN) 5-325 MG per tablet 1-2 tablet  1-2 tablet Oral Q6H PRN Eugenie Filler, MD   2 tablet at 07/21/15 0440  . latanoprost (XALATAN) 0.005 % ophthalmic solution 1 drop  1 drop Both Eyes QHS Eugenie Filler, MD   1 drop at 07/20/15 2155  . LORazepam (ATIVAN) tablet 0.5 mg  0.5 mg Oral Daily PRN Eugenie Filler, MD   0.5 mg at 07/20/15 2152  . morphine 2 MG/ML injection 0.5 mg  0.5 mg Intravenous Q2H PRN Eugenie Filler, MD   0.5 mg at 07/20/15 1013  . mupirocin ointment (BACTROBAN) 2 % 1 application  1 application Nasal BID Kelvin Cellar, MD   1 application at [id number]  . nystatin (MYCOSTATIN) powder   Topical BID Eugenie Filler, MD      . nystatin cream (MYCOSTATIN) 1 application  1 application Topical BID Eugenie Filler, MD   1 application at [id number]  . ondansetron (ZOFRAN) tablet 4 mg  4 mg Oral Q6H PRN Eugenie Filler, MD       Or  . ondansetron [facility]) injection 4 mg  4 mg Intravenous Q6H PRN Eugenie Filler, MD      . senna [facility]) tablet 8.6 mg  1 tablet Oral BID Eugenie Filler, MD   8.6 mg at 07/20/15 0927  . senna-docusate (Senokot-S) tablet 1 tablet  1 tablet Oral QHS PRN Eugenie Filler, MD      . sorbitol 70 % solution 30 mL  30 mL Oral Daily PRN Irine Seal V, MD      . tamsulosin [facility]) capsule 0.4 mg  0.4 mg Oral Daily Eugenie Filler, MD   0.4 mg at 07/20/15 0914  . timolol (TIMOPTIC) 0.5 % ophthalmic solution 1 drop  1 drop Both Eyes BID Eugenie Filler, MD   1 drop at 07/20/15 2155     Discharge Medications: Please see discharge summary for a list of discharge medications.    CONTINUE these medications which have CHANGED   Details  HYDROcodone-acetaminophen (NORCO) 5-325 MG tablet Take 1 tablet by mouth every 6 (six) hours as needed for moderate pain. Qty: 20 tablet, Refills: 0    LORazepam (ATIVAN) 0.5 MG tablet Take 1 tablet (0.5 mg total) by  mouth every 8 (eight) hours as needed for anxiety (agitation). Qty: 30 tablet, Refills: 0      CONTINUE these medications which have NOT CHANGED   Details  acetaminophen (TYLENOL) 325 MG tablet Take 650 mg by mouth 4 (four) times daily. May take an additional 325mg  every 12 hours as needed for pain/fever    amLODipine (NORVASC) 10 MG tablet Take 1 tablet (10 mg total) by mouth daily.  Qty: 30 tablet, Refills: 6    bimatoprost (LUMIGAN) 0.01 % SOLN Place 1 drop into both eyes at bedtime.    divalproex (DEPAKOTE SPRINKLE) 125 MG capsule Take 125 mg by mouth every 12 (twelve) hours.    docusate sodium (COLACE) 100 MG capsule Take 100 mg by mouth daily.    haloperidol (HALDOL) 2 MG tablet Take 2 mg by mouth every 4 (four) hours as needed for agitation (anxiety or restlessness).    nystatin (NYSTATIN) powder Apply topically. Apply topically to abdominal folds twice daily as needed for rash    nystatin cream (MYCOSTATIN) Apply 1 application topically 2 (two) times daily. Apply to groin and gluteals    ranitidine (ZANTAC) 75 MG tablet Take 75 mg by mouth 2 (two) times daily.    Silicone-Lanolin-Zinc Oxide (SELAN + ZINC OXIDE EX) Apply topically. Apply topically to buttocks with each brief change and with bath    Tamsulosin HCl (FLOMAX) 0.4 MG CAPS Take 0.4 mg by mouth daily.     timolol (TIMOPTIC) 0.5 % ophthalmic solution Place 1 drop into both eyes 2 (two) times daily. Qty: 10 mL, Refills: 3      STOP taking these medications     losartan (COZAAR) 100 MG tablet      sulfamethoxazole-trimethoprim (BACTRIM DS,SEPTRA DS) 800-160 MG tablet             Relevant Imaging Results:  Relevant Lab Results:   Additional Information SS # [id number]  Nyriah Coote, Randall An, LCSW

## 2015-07-21 NOTE — Progress Notes (Signed)
MD called about patients mental status at this time. RN did not feel comfortable giving patient his oral medications because patient would not respond to verbal commands.   Patient has been sleepy and lethargic all morning. MD aware of patient status, and states that as long as patient is comfortable and does not seem to be agitated RN staff is to give comfort care measures.   Patient bathed with soap bed bath, and CHG cloths. Bactroban also done in nostrils, and nystatin cream and powder applied to underarms, and moist areas in groin.

## 2015-07-21 NOTE — NC FL2 (Signed)
[place] LEVEL OF CARE SCREENING TOOL     IDENTIFICATION  Patient Name: Ronald Hunt Birthdate: [birth year] Sex: male Admission Date (Current Location): 07/19/2015  [facility] and [place] Number:  Herbalist and Address:  [facility],  [place] [address], [place]      Provider Number: [id number]  Attending Physician Name and Address:  Kelvin Cellar, MD  Relative Name and Phone Number:       Current Level of Care: Hospital ([place] [facility]) Recommended Level of Care: Memory Care Prior Approval Number:    Date Approved/Denied:   PASRR Number:    Discharge Plan: Other (Comment) (memory care unit)    Current Diagnoses: Patient Active Problem List   Diagnosis Date Noted  . Fracture of femoral neck, right, closed 07/19/2015  . Acute kidney injury superimposed on CKD ([place]) 07/19/2015  . Dehydration 07/19/2015  . Dementia 07/19/2015  . Anemia 07/19/2015  . Aftercare following surgery of the circulatory system 12/24/2013  . Fall 03/23/2013  . Finger laceration 03/23/2013  . Inguinal hernia unilateral, non-recurrent, right 12/10/2012  . Aftercare following surgery of the circulatory system, [place] 11/28/2012  . Dementia with behavioral disturbance 06/25/2012  . TIA (transient ischemic attack) 10/21/2011  . Glaucoma 10/20/2011  . CVA (cerebral infarction) 10/20/2011  . HTN (hypertension) 10/20/2011  . DM (diabetes mellitus) ([place]) 10/20/2011  . GERD (gastroesophageal reflux disease) 10/20/2011  . Slurred speech 10/20/2011  . Acute renal failure ([place]) 10/20/2011  . Hypokalemia 10/20/2011  . Occlusion and stenosis of carotid artery without mention of cerebral infarction 08/09/2011  . Carotid stenosis 01/25/2011    Orientation RESPIRATION BLADDER Height & Weight      (disoriented x4)  Normal Indwelling catheter Weight: 83.915 kg (185 lb) Height:  6\' 1"  (185.4 cm)  BEHAVIORAL SYMPTOMS/MOOD NEUROLOGICAL BOWEL NUTRITION STATUS       Incontinent  (As Tolerated )  AMBULATORY STATUS COMMUNICATION OF NEEDS Skin   Total Care Verbally Normal                       Personal Care Assistance Level of Assistance  Bathing, Feeding, Dressing, Total care Bathing Assistance: Maximum assistance Feeding assistance: Maximum assistance Dressing Assistance: Maximum assistance Total Care Assistance: Maximum assistance   Functional Limitations Info  Sight, Hearing, Speech Sight Info: Adequate Hearing Info: Adequate Speech Info: Adequate    SPECIAL CARE FACTORS FREQUENCY                       Contractures Contractures Info: Not present    Additional Factors Info  Code Status Code Status Info: DNR             Current Medications (07/21/2015):  This is the current hospital active medication list Current Facility-Administered Medications  Medication Dose Route Frequency Provider Last Rate Last Dose  . 0.9 %  sodium chloride infusion   Intravenous Continuous Kelvin Cellar, MD 75 mL/hr at 07/20/15 2151    . acetaminophen (TYLENOL) tablet 650 mg  650 mg Oral Q6H PRN Eugenie Filler, MD       Or  . acetaminophen (TYLENOL) suppository 650 mg  650 mg Rectal Q6H PRN Eugenie Filler, MD      . amLODipine (NORVASC) tablet 10 mg  10 mg Oral Daily Eugenie Filler, MD   10 mg at 07/20/15 0913  . Chlorhexidine Gluconate Cloth 2 % PADS 6 each  6 each Topical Q0600 Ezequiel  Coralyn Pear, MD      . divalproex (DEPAKOTE SPRINKLE) capsule 125 mg  125 mg Oral Q12H Eugenie Filler, MD   125 mg at 07/20/15 2152  . docusate sodium (COLACE) capsule 100 mg  100 mg Oral Daily Eugenie Filler, MD   100 mg at 07/20/15 0914  . famotidine (PEPCID) tablet 20 mg  20 mg Oral BID Eugenie Filler, MD   20 mg at 07/20/15 2152  . haloperidol (HALDOL) tablet 2 mg  2 mg Oral Q4H PRN Eugenie Filler, MD   2 mg at 07/21/15 0045  . HYDROcodone-acetaminophen (NORCO/VICODIN) 5-325 MG per tablet 1 tablet  1 tablet Oral Q4H PRN Eugenie Filler,  MD   1 tablet at 07/20/15 2152  . HYDROcodone-acetaminophen (NORCO/VICODIN) 5-325 MG per tablet 1-2 tablet  1-2 tablet Oral Q6H PRN Eugenie Filler, MD   2 tablet at 07/21/15 0440  . latanoprost (XALATAN) 0.005 % ophthalmic solution 1 drop  1 drop Both Eyes QHS Eugenie Filler, MD   1 drop at 07/20/15 2155  . LORazepam (ATIVAN) tablet 0.5 mg  0.5 mg Oral Daily PRN Eugenie Filler, MD   0.5 mg at 07/20/15 2152  . morphine 2 MG/ML injection 0.5 mg  0.5 mg Intravenous Q2H PRN Eugenie Filler, MD   0.5 mg at 07/20/15 1013  . mupirocin ointment (BACTROBAN) 2 % 1 application  1 application Nasal BID Kelvin Cellar, MD   1 application at [id number]  . nystatin (MYCOSTATIN) powder   Topical BID Eugenie Filler, MD      . nystatin cream (MYCOSTATIN) 1 application  1 application Topical BID Eugenie Filler, MD   1 application at [id number]  . ondansetron (ZOFRAN) tablet 4 mg  4 mg Oral Q6H PRN Eugenie Filler, MD       Or  . ondansetron [facility]) injection 4 mg  4 mg Intravenous Q6H PRN Eugenie Filler, MD      . senna [facility]) tablet 8.6 mg  1 tablet Oral BID Eugenie Filler, MD   8.6 mg at 07/20/15 0927  . senna-docusate (Senokot-S) tablet 1 tablet  1 tablet Oral QHS PRN Eugenie Filler, MD      . sorbitol 70 % solution 30 mL  30 mL Oral Daily PRN Irine Seal V, MD      . tamsulosin [facility]) capsule 0.4 mg  0.4 mg Oral Daily Eugenie Filler, MD   0.4 mg at 07/20/15 0914  . timolol (TIMOPTIC) 0.5 % ophthalmic solution 1 drop  1 drop Both Eyes BID Eugenie Filler, MD   1 drop at 07/20/15 2155     Discharge Medications: Please see discharge summary for a list of discharge medications. CONTINUE these medications which have CHANGED   Details  HYDROcodone-acetaminophen (NORCO) 5-325 MG tablet Take 1 tablet by mouth every 6 (six) hours as needed for moderate pain. 1-2 tabs po q6 hours prn pain Qty: 20 tablet, Refills: 0    LORazepam (ATIVAN) 0.5 MG tablet Take 1  tablet (0.5 mg total) by mouth every 8 (eight) hours as needed for anxiety (agitation). Qty: 30 tablet, Refills: 0      CONTINUE these medications which have NOT CHANGED   Details  acetaminophen (TYLENOL) 325 MG tablet Take 650 mg by mouth 4 (four) times daily. May take an additional 325mg  every 12 hours as needed for pain/fever    amLODipine (NORVASC) 10 MG tablet Take 1 tablet (10 mg  total) by mouth daily. Qty: 30 tablet, Refills: 6    bimatoprost (LUMIGAN) 0.01 % SOLN Place 1 drop into both eyes at bedtime.    divalproex (DEPAKOTE SPRINKLE) 125 MG capsule Take 125 mg by mouth every 12 (twelve) hours.    docusate sodium (COLACE) 100 MG capsule Take 100 mg by mouth daily.    haloperidol (HALDOL) 2 MG tablet Take 2 mg by mouth every 4 (four) hours as needed for agitation (anxiety or restlessness).    nystatin (NYSTATIN) powder Apply topically. Apply topically to abdominal folds twice daily as needed for rash    nystatin cream (MYCOSTATIN) Apply 1 application topically 2 (two) times daily. Apply to groin and gluteals    ranitidine (ZANTAC) 75 MG tablet Take 75 mg by mouth 2 (two) times daily.    Silicone-Lanolin-Zinc Oxide (SELAN + ZINC OXIDE EX) Apply topically. Apply topically to buttocks with each brief change and with bath    Tamsulosin HCl (FLOMAX) 0.4 MG CAPS Take 0.4 mg by mouth daily.     timolol (TIMOPTIC) 0.5 % ophthalmic solution Place 1 drop into both eyes 2 (two) times daily. Qty: 10 mL, Refills: 3      STOP taking these medications     losartan (COZAAR) 100 MG tablet      sulfamethoxazole-trimethoprim (BACTRIM DS,SEPTRA DS) 800-160 MG tablet         Relevant Imaging Results:  Relevant Lab Results:   Additional Information SS # [id number]  Daaron Dimarco, Randall An, LCSW

## 2015-07-21 NOTE — Progress Notes (Signed)
[facility]- GIP RN Visit  This is a related, covered admission from 07/19/15 to [facility] diagnosis of Renal CA, per Dr. Shelbie Proctor. Patient has an [place] DNR. Patient seen in room, resting in bed. Patient lethargic and not responding to verbal stimuli. Patient is on RA with normal respirations and O2 sats at 95%. Per chart review, patient received 2 mg of Haldol po for agitation early this morning and 2 5-325 mg Vicodin tablets for pain.  Patient has a urinary catheter present with minimal tea-colored drainage noted. No family present at time of visit. Placed call to son, [name] who stated that he wishes for the patient to return to [facility] today if possible.He denied any needs or concerns.  Spoke to hospice RN, Butch Penny to confirm patient has Haldol and Vicodin at the facility.  Plan is to keep the F/C in place at discharge.   Please call with any hospice-related questions or concerns.  Thank You, Freddi Starr RN, [facility] Liaison [phone]

## 2015-07-21 NOTE — Discharge Summary (Addendum)
Physician Discharge Summary  [name] [id number] DOB: [birth year] DOA: 07/19/2015  PCP: Reymundo Poll, MD  Admit date: 07/19/2015 Discharge date: 07/21/2015  Time spent: 35 minutes  Recommendations for Outpatient Follow-up:  1. Ronald Hunt was discharged to [facility] with [facility]. Hospice following. 2. He was discharged with indwelling foley catheter for comfort.      Discharge Diagnoses:  Principal Problem:   Fracture of femoral neck, right, closed Active Problems:   Carotid stenosis   Glaucoma   CVA (cerebral infarction)   HTN (hypertension)   DM (diabetes mellitus) (HCC)   GERD (gastroesophageal reflux disease)   Dementia with behavioral disturbance   Acute kidney injury superimposed on CKD ([place])   Dehydration   Dementia   Anemia   Discharge Condition: Stable for transport  Diet recommendation: As tolerated  Filed Weights   07/19/15 1410 07/21/15 0555  Weight: 86.183 kg (190 lb) 83.915 kg (185 lb)    History of present illness:  Ronald Hunt is a 82 y.o. male with medical history significant of carotid artery stenosis status post right carotid endarterectomy 09/12/2011, renal cell carcinoma, prostate cancer, history of TIAs, diabetes, depression/anxiety, dementia in a memory care unit, glaucoma, mild aortic insufficiency who presents to the ED with complaints of a hip fracture. Patient with dementia and a such history obtained from patient's son and daughter-in-law. Per patient's son and daughter-in-law patient was walking back from dinner to his room when he fell outside his room. Hospice nurse was called and prescription for pain medication was made however medication did not come until around 4:30 on the day of admission. X-rays were done of the patient's hip which are consistent with a right femoral neck fracture. Patient was subsequently sent to the ED.  Family patient did not have any syncopal episodes, no fever, no chills, no  nausea, no vomiting, no abdominal pain, no diarrhea, no constipation, no chest pain, no shortness of breath, no melena, no hematemesis, no hematochezia, no cough. Patient uses a walker to get around per family. [facility] hospital[facility] were called to admit the patient for further evaluation and management.  Hospital Course:  Ronald Hunt is a 82 year old gentleman with multiple comorbidities including advanced dementia, history of TIAs, renal cell carcinoma, diabetes mellitus, admitted to the medicine service on 07/19/2015. He had been residing at [facility] where hospice had been involved in his care. He had a fall outside of his room, unable to bear weight. Imaging studies performed in the emergency department revealed a right femoral neck fracture. Orthopedic surgery was consulted as patient was evaluated by Dr. Percell Miller. Having advanced dementia with poor functional status orthopedic surgery had a long discussion regarding the risks and benefits of undergoing orthopedic repair.  . Right femoral neck fracture.  -Ronald Hunt having a fall at his facility. At baseline he has advanced dementia for which hospice had been following. He also has a poor functional status. Imaging studies showing right femoral neck fracture. -Orthopedic surgery was consulted patient seen and evaluated by Dr. Percell Miller. He held family discussion regarding risks and benefits of surgery for Ronald [name].  -I spoke with his son and daughter as they expressed wishes for nonoperative management. Hospice will continue to provide care  2. Acute on chronic kidney disease versus progression of chronic kidney disease -Patient having a history of stage III chronic kidney disease with baseline creatinine near 2014 1.2 - 1.4. On presenting lab work he had a creatinine of 3.2 with  BUN of 42. -Dehydration may have lead to fall. -He was given gentle IV fluid hydration with normal saline at 75 mL/hour during this hospitalization  3. Advanced  dementia. -At baseline Ronald. Hunt has a poor functional status, requires assistance with all activities of daily living -Hospice services following  4. History of renal cell cancer/prostate cancer -His overall prognosis is poor with hospice services involved in his care. Family members wishing to discuss goals of care from this point forward, hospice services consulted  Consultations:  Orthopedics  Discharge Exam: Filed Vitals:   07/20/15 1404 07/21/15 0555  BP: 122/42 154/67  Pulse: 70 69  Temp: 98.1 F (36.7 C) 97.5 F (36.4 C)  Resp: 16 16   General exam: Chronically ill-appearing, minimally arousable Respiratory system: Clear to auscultation. Respiratory effort normal. Cardiovascular system: S1 & S2 heard, RRR. No JVD, murmurs, rubs, gallops or clicks. No pedal edema. Gastrointestinal system: Abdomen is nondistended, soft and nontender. No organomegaly or masses felt. Normal bowel sounds heard. Central nervous system: Difficult to arouse, seems to fall asleep quickly, has history of advanced dementia Extremities: Symmetric 5 x 5 power. Skin: No rashes, lesions or ulcers Psychiatry: History of advanced dementia, mumbling a few words, unable to follow commands.  Discharge Instructions   Discharge Instructions    Call MD for:  difficulty breathing, headache or visual disturbances    Complete by:  As directed      Call MD for:  extreme fatigue    Complete by:  As directed      Call MD for:  hives    Complete by:  As directed      Call MD for:  persistant dizziness or light-headedness    Complete by:  As directed      Call MD for:  persistant nausea and vomiting    Complete by:  As directed      Call MD for:  redness, tenderness, or signs of infection (pain, swelling, redness, odor or green/yellow discharge around incision site)    Complete by:  As directed      Call MD for:  severe uncontrolled pain    Complete by:  As directed      Call MD for:  temperature >100.4     Complete by:  As directed      Call MD for:    Complete by:  As directed      Diet - low sodium heart healthy    Complete by:  As directed      Diet - low sodium heart healthy    Complete by:  As directed      Increase activity slowly    Complete by:  As directed      Increase activity slowly    Complete by:  As directed           Current Discharge Medication List    CONTINUE these medications which have CHANGED   Details  HYDROcodone-acetaminophen (NORCO) 5-325 MG tablet Take 1 tablet by mouth every 6 (six) hours as needed for moderate pain. Qty: 20 tablet, Refills: 0    LORazepam (ATIVAN) 0.5 MG tablet Take 1 tablet (0.5 mg total) by mouth every 8 (eight) hours as needed for anxiety (agitation). Qty: 30 tablet, Refills: 0      CONTINUE these medications which have NOT CHANGED   Details  acetaminophen (TYLENOL) 325 MG tablet Take 650 mg by mouth 4 (four) times daily. May take an additional 325mg  every 12 hours as needed  for pain/fever    amLODipine (NORVASC) 10 MG tablet Take 1 tablet (10 mg total) by mouth daily. Qty: 30 tablet, Refills: 6    bimatoprost (LUMIGAN) 0.01 % SOLN Place 1 drop into both eyes at bedtime.    divalproex (DEPAKOTE SPRINKLE) 125 MG capsule Take 125 mg by mouth every 12 (twelve) hours.    docusate sodium (COLACE) 100 MG capsule Take 100 mg by mouth daily.    haloperidol (HALDOL) 2 MG tablet Take 2 mg by mouth every 4 (four) hours as needed for agitation (anxiety or restlessness).    nystatin (NYSTATIN) powder Apply topically. Apply topically to abdominal folds twice daily as needed for rash    nystatin cream (MYCOSTATIN) Apply 1 application topically 2 (two) times daily. Apply to groin and gluteals    ranitidine (ZANTAC) 75 MG tablet Take 75 mg by mouth 2 (two) times daily.    Silicone-Lanolin-Zinc Oxide (SELAN + ZINC OXIDE EX) Apply topically. Apply topically to buttocks with each brief change and with bath    Tamsulosin HCl (FLOMAX) 0.4 MG  CAPS Take 0.4 mg by mouth daily.     timolol (TIMOPTIC) 0.5 % ophthalmic solution Place 1 drop into both eyes 2 (two) times daily. Qty: 10 mL, Refills: 3      STOP taking these medications     losartan (COZAAR) 100 MG tablet      sulfamethoxazole-trimethoprim (BACTRIM DS,SEPTRA DS) 800-160 MG tablet        No Known Allergies    The results of significant diagnostics from this hospitalization (including imaging, microbiology, ancillary and laboratory) are listed below for reference.    Significant Diagnostic Studies: Dg Chest 1 View  07/19/2015  CLINICAL DATA:  Preoperative examination for patient with a right hip fracture. Initial encounter. EXAM: CHEST 1 VIEW COMPARISON:  PA and lateral chest 03/22/2013 and 06/20/2012. FINDINGS: The patient is slightly rotated on the study. Lung volumes are low with some crowding of the bronchovascular structures. No consolidative process, pneumothorax or effusion. Heart size is normal. IMPRESSION: No acute finding in a low volume chest. Electronically Signed   By: Inge Rise M.D.   On: 07/19/2015 15:59   Dg Hip Unilat With Pelvis 2-3 Views Right  07/19/2015  CLINICAL DATA:  Right hip shortening after fall. EXAM: DG HIP (WITH OR WITHOUT PELVIS) 2-3V RIGHT COMPARISON:  None. FINDINGS: Displaced right femoral neck fracture with proximal migration of the femoral shaft. Femoral head remains seated. Remaining bony pelvis including the pubic rami are intact. No additional acute fracture. IMPRESSION: Displaced right femoral neck fracture. Electronically Signed   By: Jeb Levering M.D.   On: 07/19/2015 16:04    Microbiology: Recent Results (from the past 240 hour(s))  MRSA PCR Screening     Status: Abnormal   Collection Time: 07/20/15  2:34 AM  Result Value Ref Range Status   MRSA by PCR POSITIVE (A) NEGATIVE Final    Comment:        The GeneXpert MRSA Assay (FDA approved for NASAL specimens only), is one component of a comprehensive MRSA  colonization surveillance program. It is not intended to diagnose MRSA infection nor to guide or monitor treatment for MRSA infections. RESULT CALLED TO, READ BACK BY AND VERIFIED WITH: A. [facility] RN AT [id number] ON 04.26.17 BY SHUEA      Labs: Basic Metabolic Panel:  Recent Labs Lab 07/19/15 1541 07/19/15 2045 07/20/15 0411 07/21/15 0552  NA 140  --  139 139  K 5.0  --  5.1 4.6  CL 110  --  110 110  CO2 21*  --  20* 19*  GLUCOSE 124*  --  136* 139*  BUN 42*  --  45* 41*  CREATININE 3.22*  --  3.32* 3.12*  CALCIUM 8.4*  --  8.2* 8.3*  MG  --  2.3  --   --    Liver Function Tests:  Recent Labs Lab 07/19/15 1541  AST 16  ALT 16*  ALKPHOS 71  BILITOT 0.6  PROT 6.6  ALBUMIN 3.1*   No results for input(s): LIPASE, AMYLASE in the last 168 hours. No results for input(s): AMMONIA in the last 168 hours. CBC:  Recent Labs Lab 07/19/15 1541 07/20/15 0411  WBC 9.0 8.6  NEUTROABS 7.0  --   HGB 8.3* 8.1*  HCT 26.1* 25.5*  MCV 85.3 87.6  PLT 159 181   Cardiac Enzymes: No results for input(s): CKTOTAL, CKMB, CKMBINDEX, TROPONINI in the last 168 hours. BNP: BNP (last 3 results) No results for input(s): BNP in the last 8760 hours.  ProBNP (last 3 results) No results for input(s): PROBNP in the last 8760 hours.  CBG: No results for input(s): GLUCAP in the last 168 hours.     Signed:  Kelvin Cellar MD.  [facility] 07/21/2015, 11:40 AM

## 2015-09-24 DEATH — deceased

## 2016-01-05 ENCOUNTER — Encounter (HOSPITAL_COMMUNITY): Payer: Medicare Other

## 2016-01-05 ENCOUNTER — Ambulatory Visit: Payer: Medicare Other | Admitting: Family
